# Patient Record
Sex: Female | Born: 1965 | Hispanic: No | Marital: Single | Smoking: Current every day smoker
Health system: Southern US, Community
[De-identification: ages and names within clinical notes are randomized; demographics above are authoritative.]

## PROBLEM LIST (undated history)

## (undated) DIAGNOSIS — R079 Chest pain, unspecified: Secondary | ICD-10-CM

## (undated) DIAGNOSIS — I10 Essential (primary) hypertension: Secondary | ICD-10-CM

## (undated) DIAGNOSIS — M1990 Unspecified osteoarthritis, unspecified site: Secondary | ICD-10-CM

## (undated) DIAGNOSIS — E119 Type 2 diabetes mellitus without complications: Secondary | ICD-10-CM

## (undated) DIAGNOSIS — I219 Acute myocardial infarction, unspecified: Secondary | ICD-10-CM

## (undated) DIAGNOSIS — K219 Gastro-esophageal reflux disease without esophagitis: Secondary | ICD-10-CM

## (undated) DIAGNOSIS — E669 Obesity, unspecified: Secondary | ICD-10-CM

## (undated) DIAGNOSIS — I499 Cardiac arrhythmia, unspecified: Secondary | ICD-10-CM

## (undated) DIAGNOSIS — Z9989 Dependence on other enabling machines and devices: Secondary | ICD-10-CM

## (undated) DIAGNOSIS — Z9289 Personal history of other medical treatment: Secondary | ICD-10-CM

## (undated) DIAGNOSIS — J189 Pneumonia, unspecified organism: Secondary | ICD-10-CM

## (undated) DIAGNOSIS — G4733 Obstructive sleep apnea (adult) (pediatric): Secondary | ICD-10-CM

## (undated) DIAGNOSIS — J45909 Unspecified asthma, uncomplicated: Secondary | ICD-10-CM

## (undated) DIAGNOSIS — R000 Tachycardia, unspecified: Secondary | ICD-10-CM

## (undated) HISTORY — DX: Chest pain, unspecified: R07.9

## (undated) HISTORY — DX: Essential (primary) hypertension: I10

## (undated) HISTORY — DX: Tachycardia, unspecified: R00.0

---

## 1997-02-28 ENCOUNTER — Ambulatory Visit (HOSPITAL_COMMUNITY): Admission: RE | Admit: 1997-02-28 | Discharge: 1997-02-28 | Payer: Self-pay | Admitting: Obstetrics

## 1997-03-08 ENCOUNTER — Inpatient Hospital Stay (HOSPITAL_COMMUNITY): Admission: AD | Admit: 1997-03-08 | Discharge: 1997-03-09 | Payer: Self-pay | Admitting: Obstetrics & Gynecology

## 1997-03-09 ENCOUNTER — Inpatient Hospital Stay (HOSPITAL_COMMUNITY): Admission: RE | Admit: 1997-03-09 | Discharge: 1997-03-10 | Payer: Self-pay | Admitting: Obstetrics

## 1997-03-10 ENCOUNTER — Inpatient Hospital Stay (HOSPITAL_COMMUNITY): Admission: AD | Admit: 1997-03-10 | Discharge: 1997-03-14 | Payer: Self-pay | Admitting: Obstetrics

## 1997-05-02 ENCOUNTER — Encounter: Admission: RE | Admit: 1997-05-02 | Discharge: 1997-05-02 | Payer: Self-pay | Admitting: Family Medicine

## 1997-05-23 ENCOUNTER — Encounter: Admission: RE | Admit: 1997-05-23 | Discharge: 1997-05-23 | Payer: Self-pay | Admitting: Family Medicine

## 1997-06-06 ENCOUNTER — Encounter: Admission: RE | Admit: 1997-06-06 | Discharge: 1997-06-06 | Payer: Self-pay | Admitting: Family Medicine

## 1997-06-19 ENCOUNTER — Encounter: Admission: RE | Admit: 1997-06-19 | Discharge: 1997-06-19 | Payer: Self-pay | Admitting: Family Medicine

## 1997-06-29 ENCOUNTER — Encounter: Admission: RE | Admit: 1997-06-29 | Discharge: 1997-06-29 | Payer: Self-pay | Admitting: Family Medicine

## 1997-08-06 ENCOUNTER — Encounter: Admission: RE | Admit: 1997-08-06 | Discharge: 1997-08-06 | Payer: Self-pay | Admitting: Family Medicine

## 1997-08-13 ENCOUNTER — Encounter: Admission: RE | Admit: 1997-08-13 | Discharge: 1997-08-13 | Payer: Self-pay | Admitting: Family Medicine

## 1997-09-05 ENCOUNTER — Encounter: Admission: RE | Admit: 1997-09-05 | Discharge: 1997-09-05 | Payer: Self-pay | Admitting: Family Medicine

## 1998-01-29 ENCOUNTER — Other Ambulatory Visit: Admission: RE | Admit: 1998-01-29 | Discharge: 1998-01-29 | Payer: Self-pay

## 1998-01-29 ENCOUNTER — Encounter: Admission: RE | Admit: 1998-01-29 | Discharge: 1998-01-29 | Payer: Self-pay | Admitting: Family Medicine

## 1998-06-24 ENCOUNTER — Encounter: Admission: RE | Admit: 1998-06-24 | Discharge: 1998-06-24 | Payer: Self-pay | Admitting: Sports Medicine

## 1998-09-28 ENCOUNTER — Emergency Department (HOSPITAL_COMMUNITY): Admission: EM | Admit: 1998-09-28 | Discharge: 1998-09-28 | Payer: Self-pay | Admitting: Emergency Medicine

## 1998-11-01 ENCOUNTER — Encounter: Admission: RE | Admit: 1998-11-01 | Discharge: 1998-11-01 | Payer: Self-pay | Admitting: Family Medicine

## 1998-11-05 ENCOUNTER — Emergency Department (HOSPITAL_COMMUNITY): Admission: EM | Admit: 1998-11-05 | Discharge: 1998-11-05 | Payer: Self-pay | Admitting: Emergency Medicine

## 1998-12-13 ENCOUNTER — Encounter: Admission: RE | Admit: 1998-12-13 | Discharge: 1998-12-13 | Payer: Self-pay | Admitting: Family Medicine

## 1999-01-22 ENCOUNTER — Encounter: Admission: RE | Admit: 1999-01-22 | Discharge: 1999-01-22 | Payer: Self-pay | Admitting: Family Medicine

## 1999-06-23 ENCOUNTER — Encounter: Payer: Self-pay | Admitting: Emergency Medicine

## 1999-06-23 ENCOUNTER — Emergency Department (HOSPITAL_COMMUNITY): Admission: EM | Admit: 1999-06-23 | Discharge: 1999-06-23 | Payer: Self-pay | Admitting: Emergency Medicine

## 2001-02-05 ENCOUNTER — Encounter: Payer: Self-pay | Admitting: Emergency Medicine

## 2001-02-05 ENCOUNTER — Emergency Department (HOSPITAL_COMMUNITY): Admission: EM | Admit: 2001-02-05 | Discharge: 2001-02-05 | Payer: Self-pay | Admitting: Emergency Medicine

## 2002-01-09 ENCOUNTER — Emergency Department (HOSPITAL_COMMUNITY): Admission: EM | Admit: 2002-01-09 | Discharge: 2002-01-09 | Payer: Self-pay | Admitting: Emergency Medicine

## 2002-01-09 ENCOUNTER — Encounter: Payer: Self-pay | Admitting: Emergency Medicine

## 2002-02-28 ENCOUNTER — Emergency Department (HOSPITAL_COMMUNITY): Admission: EM | Admit: 2002-02-28 | Discharge: 2002-02-28 | Payer: Self-pay | Admitting: Emergency Medicine

## 2002-02-28 ENCOUNTER — Encounter: Payer: Self-pay | Admitting: Internal Medicine

## 2002-05-04 ENCOUNTER — Inpatient Hospital Stay (HOSPITAL_COMMUNITY): Admission: AD | Admit: 2002-05-04 | Discharge: 2002-05-04 | Payer: Self-pay | Admitting: Family Medicine

## 2002-06-07 ENCOUNTER — Emergency Department (HOSPITAL_COMMUNITY): Admission: EM | Admit: 2002-06-07 | Discharge: 2002-06-07 | Payer: Self-pay | Admitting: Emergency Medicine

## 2002-06-14 ENCOUNTER — Emergency Department (HOSPITAL_COMMUNITY): Admission: EM | Admit: 2002-06-14 | Discharge: 2002-06-14 | Payer: Self-pay | Admitting: Emergency Medicine

## 2003-09-27 ENCOUNTER — Emergency Department (HOSPITAL_COMMUNITY): Admission: EM | Admit: 2003-09-27 | Discharge: 2003-09-27 | Payer: Self-pay | Admitting: Emergency Medicine

## 2003-10-15 ENCOUNTER — Emergency Department (HOSPITAL_COMMUNITY): Admission: EM | Admit: 2003-10-15 | Discharge: 2003-10-15 | Payer: Self-pay | Admitting: Emergency Medicine

## 2004-03-22 ENCOUNTER — Emergency Department (HOSPITAL_COMMUNITY): Admission: EM | Admit: 2004-03-22 | Discharge: 2004-03-23 | Payer: Self-pay | Admitting: Emergency Medicine

## 2004-10-12 ENCOUNTER — Emergency Department (HOSPITAL_COMMUNITY): Admission: EM | Admit: 2004-10-12 | Discharge: 2004-10-12 | Payer: Self-pay | Admitting: Emergency Medicine

## 2005-08-12 ENCOUNTER — Ambulatory Visit (HOSPITAL_COMMUNITY): Admission: RE | Admit: 2005-08-12 | Discharge: 2005-08-12 | Payer: Self-pay | Admitting: Obstetrics

## 2006-01-26 DIAGNOSIS — J189 Pneumonia, unspecified organism: Secondary | ICD-10-CM

## 2006-01-26 HISTORY — DX: Pneumonia, unspecified organism: J18.9

## 2006-02-22 ENCOUNTER — Emergency Department (HOSPITAL_COMMUNITY): Admission: EM | Admit: 2006-02-22 | Discharge: 2006-02-22 | Payer: Self-pay | Admitting: Emergency Medicine

## 2006-02-28 ENCOUNTER — Emergency Department (HOSPITAL_COMMUNITY): Admission: EM | Admit: 2006-02-28 | Discharge: 2006-02-28 | Payer: Self-pay | Admitting: Emergency Medicine

## 2006-03-02 ENCOUNTER — Emergency Department (HOSPITAL_COMMUNITY): Admission: EM | Admit: 2006-03-02 | Discharge: 2006-03-02 | Payer: Self-pay | Admitting: Emergency Medicine

## 2006-05-07 ENCOUNTER — Emergency Department (HOSPITAL_COMMUNITY): Admission: EM | Admit: 2006-05-07 | Discharge: 2006-05-07 | Payer: Self-pay | Admitting: Emergency Medicine

## 2006-06-22 ENCOUNTER — Emergency Department (HOSPITAL_COMMUNITY): Admission: EM | Admit: 2006-06-22 | Discharge: 2006-06-22 | Payer: Self-pay | Admitting: Emergency Medicine

## 2007-09-25 ENCOUNTER — Emergency Department (HOSPITAL_COMMUNITY): Admission: EM | Admit: 2007-09-25 | Discharge: 2007-09-26 | Payer: Self-pay | Admitting: Emergency Medicine

## 2007-11-08 ENCOUNTER — Encounter: Payer: Self-pay | Admitting: Emergency Medicine

## 2007-11-08 ENCOUNTER — Observation Stay (HOSPITAL_COMMUNITY): Admission: AD | Admit: 2007-11-08 | Discharge: 2007-11-08 | Payer: Self-pay | Admitting: Obstetrics

## 2007-11-22 ENCOUNTER — Inpatient Hospital Stay (HOSPITAL_COMMUNITY): Admission: AD | Admit: 2007-11-22 | Discharge: 2007-11-22 | Payer: Self-pay | Admitting: Obstetrics

## 2008-04-15 ENCOUNTER — Emergency Department (HOSPITAL_COMMUNITY): Admission: EM | Admit: 2008-04-15 | Discharge: 2008-04-16 | Payer: Self-pay | Admitting: Emergency Medicine

## 2008-06-28 DIAGNOSIS — J45991 Cough variant asthma: Secondary | ICD-10-CM

## 2008-06-28 DIAGNOSIS — M545 Low back pain: Secondary | ICD-10-CM

## 2008-07-10 ENCOUNTER — Emergency Department (HOSPITAL_COMMUNITY): Admission: EM | Admit: 2008-07-10 | Discharge: 2008-07-10 | Payer: Self-pay | Admitting: Emergency Medicine

## 2008-07-27 ENCOUNTER — Emergency Department (HOSPITAL_COMMUNITY): Admission: EM | Admit: 2008-07-27 | Discharge: 2008-07-28 | Payer: Self-pay | Admitting: Emergency Medicine

## 2008-11-21 ENCOUNTER — Emergency Department (HOSPITAL_COMMUNITY): Admission: EM | Admit: 2008-11-21 | Discharge: 2008-11-21 | Payer: Self-pay | Admitting: Emergency Medicine

## 2008-12-16 ENCOUNTER — Emergency Department (HOSPITAL_COMMUNITY): Admission: EM | Admit: 2008-12-16 | Discharge: 2008-12-17 | Payer: Self-pay | Admitting: Emergency Medicine

## 2009-03-25 ENCOUNTER — Emergency Department (HOSPITAL_COMMUNITY): Admission: EM | Admit: 2009-03-25 | Discharge: 2009-03-25 | Payer: Self-pay | Admitting: Emergency Medicine

## 2009-04-09 ENCOUNTER — Emergency Department (HOSPITAL_COMMUNITY): Admission: EM | Admit: 2009-04-09 | Discharge: 2009-04-09 | Payer: Self-pay | Admitting: Emergency Medicine

## 2009-10-21 ENCOUNTER — Inpatient Hospital Stay (HOSPITAL_COMMUNITY): Admission: AD | Admit: 2009-10-21 | Discharge: 2009-10-21 | Payer: Self-pay | Admitting: Family Medicine

## 2009-10-21 ENCOUNTER — Ambulatory Visit: Payer: Self-pay | Admitting: Family

## 2009-10-26 DIAGNOSIS — Z9289 Personal history of other medical treatment: Secondary | ICD-10-CM

## 2009-10-26 HISTORY — DX: Personal history of other medical treatment: Z92.89

## 2009-11-13 ENCOUNTER — Observation Stay (HOSPITAL_COMMUNITY): Admission: AD | Admit: 2009-11-13 | Discharge: 2009-11-14 | Payer: Self-pay | Admitting: Obstetrics and Gynecology

## 2009-11-13 ENCOUNTER — Ambulatory Visit: Payer: Self-pay | Admitting: Nurse Practitioner

## 2010-02-16 ENCOUNTER — Encounter: Payer: Self-pay | Admitting: Obstetrics

## 2010-02-17 ENCOUNTER — Encounter: Payer: Self-pay | Admitting: Obstetrics

## 2010-04-09 LAB — URINALYSIS, ROUTINE W REFLEX MICROSCOPIC
Nitrite: NEGATIVE
pH: 6 (ref 5.0–8.0)

## 2010-04-09 LAB — CBC
HCT: 22.6 % — ABNORMAL LOW (ref 36.0–46.0)
HCT: 29.5 % — ABNORMAL LOW (ref 36.0–46.0)
MCH: 28.3 pg (ref 26.0–34.0)
MCHC: 32.9 g/dL (ref 30.0–36.0)
MCHC: 33.6 g/dL (ref 30.0–36.0)
MCV: 85.8 fL (ref 78.0–100.0)
RBC: 2.64 MIL/uL — ABNORMAL LOW (ref 3.87–5.11)
RDW: 16.2 % — ABNORMAL HIGH (ref 11.5–15.5)
WBC: 7.5 10*3/uL (ref 4.0–10.5)
WBC: 9 10*3/uL (ref 4.0–10.5)

## 2010-04-09 LAB — URINE MICROSCOPIC-ADD ON

## 2010-04-09 LAB — CROSSMATCH
Antibody Screen: NEGATIVE
Unit division: 0

## 2010-04-09 LAB — ABO/RH: ABO/RH(D): A POS

## 2010-04-09 LAB — SAMPLE TO BLOOD BANK

## 2010-04-09 LAB — POCT PREGNANCY, URINE: Preg Test, Ur: NEGATIVE

## 2010-04-09 LAB — FERRITIN: Ferritin: 11 ng/mL (ref 10–291)

## 2010-04-10 LAB — GC/CHLAMYDIA PROBE AMP, GENITAL: Chlamydia, DNA Probe: POSITIVE — AB

## 2010-04-10 LAB — URINALYSIS, ROUTINE W REFLEX MICROSCOPIC
Leukocytes, UA: NEGATIVE
Nitrite: NEGATIVE
Protein, ur: NEGATIVE mg/dL
Specific Gravity, Urine: 1.02 (ref 1.005–1.030)
Urobilinogen, UA: 1 mg/dL (ref 0.0–1.0)

## 2010-04-10 LAB — WET PREP, GENITAL
Clue Cells Wet Prep HPF POC: NONE SEEN
Trich, Wet Prep: NONE SEEN
Yeast Wet Prep HPF POC: NONE SEEN

## 2010-04-10 LAB — URINE MICROSCOPIC-ADD ON

## 2010-04-10 LAB — CBC
HCT: 36 % (ref 36.0–46.0)
MCHC: 32.5 g/dL (ref 30.0–36.0)
Platelets: 245 10*3/uL (ref 150–400)
RDW: 15.8 % — ABNORMAL HIGH (ref 11.5–15.5)
WBC: 7.1 10*3/uL (ref 4.0–10.5)

## 2010-04-16 LAB — DIFFERENTIAL
Basophils Absolute: 0 10*3/uL (ref 0.0–0.1)
Lymphocytes Relative: 30 % (ref 12–46)
Lymphs Abs: 1.6 10*3/uL (ref 0.7–4.0)
Monocytes Absolute: 0.2 10*3/uL (ref 0.1–1.0)
Neutro Abs: 3.2 10*3/uL (ref 1.7–7.7)

## 2010-04-16 LAB — BASIC METABOLIC PANEL
Calcium: 8.7 mg/dL (ref 8.4–10.5)
GFR calc non Af Amer: >60 mL/min (ref >60)
Glucose, Bld: 178 mg/dL — ABNORMAL HIGH (ref 70–99)
Potassium: 3.6 mEq/L (ref 3.5–5.1)
Sodium: 140 mEq/L (ref 135–145)

## 2010-04-16 LAB — CBC
Hemoglobin: 11.4 g/dL — ABNORMAL LOW (ref 12.0–15.0)
Platelets: 264 10*3/uL (ref 150–400)
RDW: 14.7 % (ref 11.5–15.5)
WBC: 5.2 10*3/uL (ref 4.0–10.5)

## 2010-04-16 LAB — D-DIMER, QUANTITATIVE: D-Dimer, Quant: 0.39 ug/mL-FEU (ref 0.00–0.48)

## 2010-04-30 LAB — POCT I-STAT, CHEM 8
BUN: 5 mg/dL — ABNORMAL LOW (ref 6–23)
Chloride: 103 mEq/L (ref 96–112)
Potassium: 3.4 mEq/L — ABNORMAL LOW (ref 3.5–5.1)
Sodium: 138 mEq/L (ref 135–145)

## 2010-04-30 LAB — URINE CULTURE: Colony Count: 80000

## 2010-04-30 LAB — DIFFERENTIAL
Lymphs Abs: 2.2 10*3/uL (ref 0.7–4.0)
Monocytes Relative: 6 % (ref 3–12)
Neutro Abs: 3.3 10*3/uL (ref 1.7–7.7)
Neutrophils Relative %: 55 % (ref 43–77)

## 2010-04-30 LAB — URINALYSIS, ROUTINE W REFLEX MICROSCOPIC
Glucose, UA: NEGATIVE mg/dL
Ketones, ur: NEGATIVE mg/dL
Nitrite: NEGATIVE
Protein, ur: NEGATIVE mg/dL

## 2010-04-30 LAB — POCT CARDIAC MARKERS
CKMB, poc: 1.5 ng/mL (ref 1.0–8.0)
Myoglobin, poc: 61.1 ng/mL (ref 12–200)
Troponin i, poc: <0.05 ng/mL (ref 0.00–0.09)

## 2010-04-30 LAB — URINE MICROSCOPIC-ADD ON

## 2010-04-30 LAB — CBC
Hemoglobin: 10.8 g/dL — ABNORMAL LOW (ref 12.0–15.0)
RBC: 3.93 MIL/uL (ref 3.87–5.11)
WBC: 6.1 10*3/uL (ref 4.0–10.5)

## 2010-04-30 LAB — PROTIME-INR: INR: 0.99 (ref 0.00–1.49)

## 2010-10-27 LAB — BASIC METABOLIC PANEL
BUN: 7
CO2: 24
Calcium: 9.4
Chloride: 105
Creatinine, Ser: 0.7
Glucose, Bld: 91

## 2010-10-27 LAB — CBC
HCT: 33.9 — ABNORMAL LOW
Hemoglobin: 11 — ABNORMAL LOW
MCHC: 32.1
MCV: 85.3
Platelets: 262
RDW: 15.8 — ABNORMAL HIGH
WBC: 6.7

## 2010-10-27 LAB — DIFFERENTIAL
Basophils Absolute: 0
Basophils Relative: 0
Eosinophils Absolute: 0.1
Monocytes Relative: 6
Neutro Abs: 3.6
Neutrophils Relative %: 57

## 2010-10-27 LAB — URINALYSIS, ROUTINE W REFLEX MICROSCOPIC
Glucose, UA: NEGATIVE
Hgb urine dipstick: NEGATIVE
Ketones, ur: NEGATIVE
Nitrite: NEGATIVE
Protein, ur: NEGATIVE
Protein, ur: NEGATIVE
Urobilinogen, UA: 1

## 2010-10-27 LAB — WET PREP, GENITAL: Yeast Wet Prep HPF POC: NONE SEEN

## 2010-10-27 LAB — URINE MICROSCOPIC-ADD ON

## 2010-10-27 LAB — PREGNANCY, URINE: Preg Test, Ur: NEGATIVE

## 2011-09-09 ENCOUNTER — Observation Stay (HOSPITAL_COMMUNITY)
Admission: EM | Admit: 2011-09-09 | Discharge: 2011-09-11 | Disposition: A | Discharge status: Home / Self Care | Payer: Medicaid Other | Attending: Internal Medicine | Admitting: Internal Medicine

## 2011-09-09 ENCOUNTER — Emergency Department (HOSPITAL_COMMUNITY): Payer: Medicaid Other

## 2011-09-09 ENCOUNTER — Encounter (HOSPITAL_COMMUNITY): Payer: Self-pay

## 2011-09-09 DIAGNOSIS — R0989 Other specified symptoms and signs involving the circulatory and respiratory systems: Secondary | ICD-10-CM | POA: Insufficient documentation

## 2011-09-09 DIAGNOSIS — R0609 Other forms of dyspnea: Secondary | ICD-10-CM | POA: Insufficient documentation

## 2011-09-09 DIAGNOSIS — D649 Anemia, unspecified: Secondary | ICD-10-CM

## 2011-09-09 DIAGNOSIS — R002 Palpitations: Principal | ICD-10-CM

## 2011-09-09 DIAGNOSIS — M549 Dorsalgia, unspecified: Secondary | ICD-10-CM | POA: Insufficient documentation

## 2011-09-09 DIAGNOSIS — R209 Unspecified disturbances of skin sensation: Secondary | ICD-10-CM | POA: Insufficient documentation

## 2011-09-09 DIAGNOSIS — M1710 Unilateral primary osteoarthritis, unspecified knee: Secondary | ICD-10-CM | POA: Insufficient documentation

## 2011-09-09 DIAGNOSIS — R609 Edema, unspecified: Secondary | ICD-10-CM | POA: Insufficient documentation

## 2011-09-09 DIAGNOSIS — E1142 Type 2 diabetes mellitus with diabetic polyneuropathy: Secondary | ICD-10-CM | POA: Insufficient documentation

## 2011-09-09 DIAGNOSIS — M179 Osteoarthritis of knee, unspecified: Secondary | ICD-10-CM

## 2011-09-09 DIAGNOSIS — R079 Chest pain, unspecified: Secondary | ICD-10-CM

## 2011-09-09 DIAGNOSIS — E119 Type 2 diabetes mellitus without complications: Secondary | ICD-10-CM

## 2011-09-09 DIAGNOSIS — R600 Localized edema: Secondary | ICD-10-CM

## 2011-09-09 DIAGNOSIS — E1340 Other specified diabetes mellitus with diabetic neuropathy, unspecified: Secondary | ICD-10-CM

## 2011-09-09 DIAGNOSIS — G8929 Other chronic pain: Secondary | ICD-10-CM | POA: Insufficient documentation

## 2011-09-09 DIAGNOSIS — J45909 Unspecified asthma, uncomplicated: Secondary | ICD-10-CM | POA: Insufficient documentation

## 2011-09-09 DIAGNOSIS — E1149 Type 2 diabetes mellitus with other diabetic neurological complication: Secondary | ICD-10-CM | POA: Insufficient documentation

## 2011-09-09 HISTORY — DX: Unspecified asthma, uncomplicated: J45.909

## 2011-09-09 LAB — CBC WITH DIFFERENTIAL/PLATELET
Basophils Relative: 0 % (ref 0–1)
Eosinophils Absolute: 0.2 10*3/uL (ref 0.0–0.7)
HCT: 34 % — ABNORMAL LOW (ref 36.0–46.0)
Hemoglobin: 10.6 g/dL — ABNORMAL LOW (ref 12.0–15.0)
MCH: 25.5 pg — ABNORMAL LOW (ref 26.0–34.0)
MCHC: 31.2 g/dL (ref 30.0–36.0)
MCV: 81.9 fL (ref 78.0–100.0)
Monocytes Absolute: 0.4 10*3/uL (ref 0.1–1.0)
Monocytes Relative: 6 % (ref 3–12)

## 2011-09-09 LAB — COMPREHENSIVE METABOLIC PANEL
Albumin: 3.4 g/dL — ABNORMAL LOW (ref 3.5–5.2)
BUN: 10 mg/dL (ref 6–23)
Chloride: 105 mEq/L (ref 96–112)
Creatinine, Ser: 0.98 mg/dL (ref 0.50–1.10)
Total Bilirubin: 0.3 mg/dL (ref 0.3–1.2)
Total Protein: 7.2 g/dL (ref 6.0–8.3)

## 2011-09-09 LAB — TROPONIN I: Troponin I: <0.3 ng/mL (ref <0.30)

## 2011-09-09 MED ORDER — ASPIRIN 81 MG PO CHEW
324.0000 mg | CHEWABLE_TABLET | Freq: Once | ORAL | Status: AC
  Administered 2011-09-09: 324 mg via ORAL
  Filled 2011-09-09: qty 4

## 2011-09-09 NOTE — ED Provider Notes (Signed)
History     CSN: [id number]  Arrival date & time 09/09/11  1410   First MD Initiated Contact with Patient 09/09/11 2032      Chief Complaint  Patient presents with  . Palpitations    (Consider location/radiation/quality/duration/timing/severity/associated sxs/prior treatment) HPI Pt reports she has had several episodes of palpitations and dyspnea with exertion. No chest pains. She has had swelling in her knees attributed to arthritis which she states is chronic. No fever or cough. She also has history of asthma, has been using inhaler with some improvement.   Past Medical History  Diagnosis Date  . Asthma     History reviewed. No pertinent past surgical history.  History reviewed. No pertinent family history.  History  Substance Use Topics  . Smoking status: Current Everyday Smoker  . Smokeless tobacco: Not on file  . Alcohol Use: Yes    OB History    Grav Para Term Preterm Abortions TAB SAB Ect Mult Living                  Review of Systems All other systems reviewed and are negative except as noted in HPI.   Allergies  Other  Home Medications   Current Outpatient Rx  Name Route Sig Dispense Refill  . ALBUTEROL SULFATE HFA 108 (90 BASE) MCG/ACT IN AERS Inhalation Inhale 2 puffs into the lungs every 6 (six) hours as needed. For asthma    . NAPROXEN SODIUM 220 MG PO TABS Oral Take 220 mg by mouth 2 (two) times daily with a meal.      BP 124/108  Pulse 80  Temp 98.2 F (36.8 C) (Oral)  Resp 16  SpO2 97%  LMP 08/27/2011  Physical Exam  Nursing note and vitals reviewed. Constitutional: She is oriented to person, place, and time. She appears well-developed and well-nourished.  HENT:  Head: Normocephalic and atraumatic.  Eyes: EOM are normal. Pupils are equal, round, and reactive to light.  Neck: Normal range of motion. Neck supple.  Cardiovascular: Normal rate, normal heart sounds and intact distal pulses.   Pulmonary/Chest: Effort normal and breath  sounds normal.  Abdominal: Bowel sounds are normal. She exhibits no distension. There is no tenderness.  Musculoskeletal: Normal range of motion. She exhibits no edema and no tenderness.  Neurological: She is alert and oriented to person, place, and time. She has normal strength. No cranial nerve deficit or sensory deficit.  Skin: Skin is warm and dry. No rash noted.  Psychiatric: She has a normal mood and affect.    ED Course  Procedures (including critical care time)  Labs Reviewed  CBC WITH DIFFERENTIAL - Abnormal; Notable for the following:    Hemoglobin 10.6 (*)     HCT 34.0 (*)     MCH 25.5 (*)     All other components within normal limits  COMPREHENSIVE METABOLIC PANEL - Abnormal; Notable for the following:    Glucose, Bld 200 (*)     Albumin 3.4 (*)     GFR calc non Af Amer 68 (*)     GFR calc Af Amer 79 (*)     All other components within normal limits  TROPONIN I   Dg Chest 1 View  09/09/2011  *RADIOLOGY REPORT*  Clinical Data: Palpitations, shortness of breath.  CHEST - 1 VIEW  Comparison: 04/09/2009  Findings: Heart and mediastinal contours are within normal limits. No focal opacities or effusions.  No acute bony abnormality.  IMPRESSION: No active cardiopulmonary disease.  Original  Report Authenticated By: Cyndie Chime, M.D.     No diagnosis found.    MDM  PERC Neg. No signs of LE edema, she has chronic knee pain.    Date: 09/09/2011  Rate: 83  Rhythm: normal sinus rhythm  QRS Axis: normal  Intervals: normal  ST/T Wave abnormalities: nonspecific ST/T changes  Conduction Disutrbances:none  Narrative Interpretation:   Old EKG Reviewed: changes noted, new T wave inversions compared to 02/11   EKG with new T wave inversions. Will admit for further eval. Hospitalist to see in the ED.        Charles B. Bernette Mayers, MD 09/10/11 2956

## 2011-09-09 NOTE — ED Notes (Signed)
Patient C/O pain in both of her knees when she walks.  She also C/O palpitations when she walks.  Both knees are somewhat swollen. States that pain is sharp and severe.

## 2011-09-09 NOTE — H&P (Signed)
[facility]                                                                                    Patient Demographics  Rebecca Malone, is a 46 y.o. female  CSN: [id number]  MRN: [id number]  DOB - [birth year]  Admit Date - 09/09/2011  Outpatient Primary MD for the patient is Daisy Floro, MD   With History of -  Past Medical History  Diagnosis Date  . Asthma       History reviewed. No pertinent past surgical history.  in for   Chief Complaint  Patient presents with  . Palpitations     HPI  Rebecca Malone  is a 46 y.o. female, with past medical history significant for asthma obesity gestational diabetes and chronic back pain presenting today with 2 days history of chest pain, retrosternal, nonradiating, associated with shortness of breath , worse with exercise. Patient denies any cold sweats or nausea. The patient has noted increased swelling in her lower extremities and she is very worried because she has a family history of heart disease. No history of orthopnea or PND . The chest pain started 2 days ago as mentioned above and this pressure-like gets better with rest. The patient also is complaining about the knees swelling and painful. Today the patient reports an episode of tingling on the left side including face arm and leg   Review of Systems    In addition to the HPI above,No Fever-chills, No Headache, No changes with Vision or hearing, No problems swallowing food or Liquids, +Chest pain, Cough +Shortness of Breath, No Abdominal pain, No Nausea or Vommitting, Bowel movements are regular, No Blood in stool or Urine, No dysuria, No new skin rashes or bruises, No new joints pains-aches,  No new weakness+tingling on left side, numbness in any extremity, No recent weight gain or loss, No polyuria, polydypsia or polyphagia, No significant Mental Stressors.  A full 10 point Review of Systems was done, except as stated above, all other Review of  Systems were negative.   Social History History  Substance Use Topics  . Smoking status: Current Everyday Smoker  . Smokeless tobacco: Not on file  . Alcohol Use: Yes     Family History Significant for heart disease in her father and history of Kawasaki's vasculitis in her son  Prior to Admission medications   Medication Sig Start Date End Date Taking? Authorizing Provider  albuterol (PROVENTIL HFA;VENTOLIN HFA) 108 (90 BASE) MCG/ACT inhaler Inhale 2 puffs into the lungs every 6 (six) hours as needed. For asthma   Yes Historical Provider, MD  naproxen sodium (ANAPROX) 220 MG tablet Take 220 mg by mouth 2 (two) times daily with a meal.   Yes Historical Provider, MD    Allergies  Allergen Reactions  . Other Itching    Patient states she is allergic to all generic medications. Can only have name brand medicines    Physical Exam  Vitals  Blood pressure 154/106, pulse 68, temperature 98.2 F (36.8 C), temperature source Oral, resp. rate 18, last menstrual period 08/27/2011, SpO2 97.00%.   1. General middle aged [place] American female extremely pleasant in no  acute distress at the moment  2. Normal affect and insight, Not Suicidal or Homicidal, Awake Alert, Oriented X 3.  3. No F.N deficits, ALL C.Nerves Intact, Strength 5/5 all 4 extremities, Sensation intact all 4 extremities, Plantars down going.  4. Ears and Eyes appear Normal, Conjunctivae clear, PERRLA. Moist Oral Mucosa.  5. Supple Neck, No JVD, No cervical lymphadenopathy appriciated, No Carotid Bruits.  6. Symmetrical Chest wall movement, Good air movement bilaterally, CTAB.  7. RRR, No Gallops, Rubs or Murmurs, No Parasternal Heave.  8. Positive Bowel Sounds, Abdomen Soft, Non tender, No organomegaly appriciated,No rebound -guarding or rigidity.  9.  No Cyanosis, Normal Skin Turgor, No Skin Rash or Bruise.  10. Good muscle tone,  joints appear normal , no effusions, Normal ROM.  11. No Palpable Lymph Nodes  in Neck or Axillae    Data Review  CBC  Lab 09/09/11 1521  WBC 6.8  HGB 10.6*  HCT 34.0*  PLT 300  MCV 81.9  MCH 25.5*  MCHC 31.2  RDW 14.3  LYMPHSABS 2.2  MONOABS 0.4  EOSABS 0.2  BASOSABS 0.0  BANDABS --   ------------------------------------------------------------------------------------------------------------------  Chemistries   Lab 09/09/11 1521  NA 141  K 3.5  CL 105  CO2 24  GLUCOSE 200*  BUN 10  CREATININE 0.98  CALCIUM 9.0  MG --  AST 14  ALT 11  ALKPHOS 93  BILITOT 0.3   ---  Cardiac Enzymes  Lab 09/09/11 2112  CKMB --  TROPONINI <0.30  MYOGLOBIN --   ------------------------------------------------------------------------------------------------------------------   ---------------------------------------------------------------------------------------------------------------   ----------------------------------------------------------------------------------------------------------------    Imaging results:   Dg Chest 1 View  09/09/2011  *RADIOLOGY REPORT*  Clinical Data: Palpitations, shortness of breath.  CHEST - 1 VIEW  Comparison: 04/09/2009  Findings: Heart and mediastinal contours are within normal limits. No focal opacities or effusions.  No acute bony abnormality.  IMPRESSION: No active cardiopulmonary disease.  Original Report Authenticated By: Cyndie Chime, M.D.    My personal review of EKG: Normal sinus rhythm with T-wave inversions in the inferolateral leads  Assessment & Plan  1. chest pain worse with exertion EKG shows T-wave inversions in inferolateral leads. Patient is chest pain-free at the moment. Start aspirin nitro paste, heart rate seems to be fair so will not start a beta blocker. I also scheduled him for a stress test in the morning.  2. Low extremity edema ; patient was started on Lasix. Lower extremity Dopplers are ordered  3. Anemia; iron studies are ordered  4. Hyperglycemia we'll check her blood  sugars during his stay with low coverage  5 obesity  6. Chronic back pain  7. History of asthma will continue her home medicine and was started on Proventil when necessary.  DVT Prophylaxis Lovenox   AM Labs Ordered, also please review Full Orders  Family Communication: Admission, patients condition and plan of care including tests being ordered have been discussed with the patient who indicate understanding and agree with the plan and Code Status.  Code Status full Disposition Plan: Home with office followup Time spent in minutes : 35 minutes  Condition fair

## 2011-09-09 NOTE — ED Notes (Signed)
Pt updated on care and wait times.  

## 2011-09-09 NOTE — ED Notes (Signed)
Pt reports chest palpitations, SOB, (L) leg swelling, (L) side numbness x2 weeks. Nad, a/o x4

## 2011-09-10 ENCOUNTER — Observation Stay (HOSPITAL_COMMUNITY): Payer: Medicaid Other

## 2011-09-10 ENCOUNTER — Encounter (HOSPITAL_COMMUNITY): Payer: Self-pay | Admitting: *Deleted

## 2011-09-10 DIAGNOSIS — R609 Edema, unspecified: Secondary | ICD-10-CM

## 2011-09-10 DIAGNOSIS — D649 Anemia, unspecified: Secondary | ICD-10-CM

## 2011-09-10 DIAGNOSIS — M7989 Other specified soft tissue disorders: Secondary | ICD-10-CM

## 2011-09-10 DIAGNOSIS — R002 Palpitations: Principal | ICD-10-CM

## 2011-09-10 LAB — COMPREHENSIVE METABOLIC PANEL
ALT: 9 U/L (ref 0–35)
AST: 11 U/L (ref 0–37)
Alkaline Phosphatase: 86 U/L (ref 39–117)
CO2: 26 mEq/L (ref 19–32)
Chloride: 102 mEq/L (ref 96–112)
GFR calc Af Amer: >90 mL/min (ref >90)
GFR calc non Af Amer: 79 mL/min — ABNORMAL LOW (ref >90)
Glucose, Bld: 218 mg/dL — ABNORMAL HIGH (ref 70–99)
Potassium: 3.5 mEq/L (ref 3.5–5.1)
Sodium: 139 mEq/L (ref 135–145)
Total Bilirubin: 0.2 mg/dL — ABNORMAL LOW (ref 0.3–1.2)

## 2011-09-10 LAB — CARDIAC PANEL(CRET KIN+CKTOT+MB+TROPI)
CK, MB: 2.5 ng/mL (ref 0.3–4.0)
CK, MB: 2.4 ng/mL (ref 0.3–4.0)
Relative Index: 1.8 (ref 0.0–2.5)
Troponin I: <0.3 ng/mL (ref <0.30)
Troponin I: <0.3 ng/mL (ref <0.30)

## 2011-09-10 LAB — CBC
HCT: 33.5 % — ABNORMAL LOW (ref 36.0–46.0)
Hemoglobin: 10.5 g/dL — ABNORMAL LOW (ref 12.0–15.0)
Hemoglobin: 10 g/dL — ABNORMAL LOW (ref 12.0–15.0)
MCH: 25.9 pg — ABNORMAL LOW (ref 26.0–34.0)
MCHC: 31.3 g/dL (ref 30.0–36.0)
RBC: 4.08 MIL/uL (ref 3.87–5.11)
RBC: 3.86 MIL/uL — ABNORMAL LOW (ref 3.87–5.11)
WBC: 6.2 10*3/uL (ref 4.0–10.5)

## 2011-09-10 LAB — GLUCOSE, CAPILLARY
Glucose-Capillary: 215 mg/dL — ABNORMAL HIGH (ref 70–99)
Glucose-Capillary: 204 mg/dL — ABNORMAL HIGH (ref 70–99)

## 2011-09-10 LAB — CREATININE, SERUM: GFR calc non Af Amer: >90 mL/min (ref >90)

## 2011-09-10 LAB — IRON AND TIBC
Saturation Ratios: 8 % — ABNORMAL LOW (ref 20–55)
UIBC: 381 ug/dL (ref 125–400)

## 2011-09-10 MED ORDER — ZOLPIDEM TARTRATE 5 MG PO TABS: 5.0000 mg | ORAL_TABLET | Freq: Every evening | ORAL | Status: DC | PRN

## 2011-09-10 MED ORDER — NITROGLYCERIN 2 % TD OINT
0.5000 [in_us] | TOPICAL_OINTMENT | Freq: Four times a day (QID) | TRANSDERMAL | Status: DC
  Administered 2011-09-10 (×2): 0.5 [in_us] via TOPICAL
  Filled 2011-09-10: qty 30

## 2011-09-10 MED ORDER — ALBUTEROL SULFATE (5 MG/ML) 0.5% IN NEBU
2.5000 mg | INHALATION_SOLUTION | Freq: Four times a day (QID) | RESPIRATORY_TRACT | Status: DC
  Administered 2011-09-10 – 2011-09-11 (×5): 2.5 mg via RESPIRATORY_TRACT
  Filled 2011-09-10 (×5): qty 0.5

## 2011-09-10 MED ORDER — ONDANSETRON HCL 4 MG/2ML IJ SOLN: 4.0000 mg | Freq: Four times a day (QID) | INTRAMUSCULAR | Status: DC | PRN

## 2011-09-10 MED ORDER — MORPHINE SULFATE 2 MG/ML IJ SOLN: 1.0000 mg | INTRAMUSCULAR | Status: DC | PRN

## 2011-09-10 MED ORDER — INSULIN ASPART 100 UNIT/ML ~~LOC~~ SOLN
0.0000 [IU] | Freq: Three times a day (TID) | SUBCUTANEOUS | Status: DC
  Administered 2011-09-10: 3 [IU] via SUBCUTANEOUS
  Administered 2011-09-10 – 2011-09-11 (×2): 1 [IU] via SUBCUTANEOUS

## 2011-09-10 MED ORDER — FUROSEMIDE 8 MG/ML PO SOLN
40.0000 mg | Freq: Every day | ORAL | Status: DC
  Administered 2011-09-10 – 2011-09-11 (×2): 40 mg via ORAL
  Filled 2011-09-10: qty 4
  Filled 2011-09-10: qty 5

## 2011-09-10 MED ORDER — ONDANSETRON HCL 4 MG PO TABS: 4.0000 mg | ORAL_TABLET | Freq: Four times a day (QID) | ORAL | Status: DC | PRN

## 2011-09-10 MED ORDER — ACETAMINOPHEN 325 MG PO TABS
650.0000 mg | ORAL_TABLET | Freq: Four times a day (QID) | ORAL | Status: DC | PRN
  Administered 2011-09-10: 650 mg via ORAL
  Filled 2011-09-10: qty 2

## 2011-09-10 MED ORDER — ASPIRIN EC 81 MG PO TBEC
81.0000 mg | DELAYED_RELEASE_TABLET | Freq: Every day | ORAL | Status: DC
  Administered 2011-09-10 – 2011-09-11 (×2): 81 mg via ORAL
  Filled 2011-09-10 (×2): qty 1

## 2011-09-10 MED ORDER — SODIUM CHLORIDE 0.9 % IJ SOLN
3.0000 mL | Freq: Two times a day (BID) | INTRAMUSCULAR | Status: DC
  Administered 2011-09-10 (×3): 3 mL via INTRAVENOUS

## 2011-09-10 MED ORDER — HYDROCODONE-ACETAMINOPHEN 5-325 MG PO TABS
1.0000 | ORAL_TABLET | ORAL | Status: DC | PRN
  Administered 2011-09-10 – 2011-09-11 (×4): 2 via ORAL
  Filled 2011-09-10 (×4): qty 2

## 2011-09-10 MED ORDER — ALBUTEROL SULFATE HFA 108 (90 BASE) MCG/ACT IN AERS: 2.0000 | INHALATION_SPRAY | Freq: Four times a day (QID) | RESPIRATORY_TRACT | Status: DC | PRN

## 2011-09-10 MED ORDER — ENOXAPARIN SODIUM 40 MG/0.4ML ~~LOC~~ SOLN
40.0000 mg | Freq: Every day | SUBCUTANEOUS | Status: DC
  Administered 2011-09-10 – 2011-09-11 (×2): 40 mg via SUBCUTANEOUS
  Filled 2011-09-10 (×2): qty 0.4

## 2011-09-10 MED ORDER — ACETAMINOPHEN 650 MG RE SUPP: 650.0000 mg | Freq: Four times a day (QID) | RECTAL | Status: DC | PRN

## 2011-09-10 MED ORDER — LIVING WELL WITH DIABETES BOOK
Freq: Once | Status: AC
  Administered 2011-09-10: 17:00:00
  Filled 2011-09-10: qty 1

## 2011-09-10 MED ORDER — DOCUSATE SODIUM 100 MG PO CAPS
100.0000 mg | ORAL_CAPSULE | Freq: Two times a day (BID) | ORAL | Status: DC
  Administered 2011-09-10 – 2011-09-11 (×3): 100 mg via ORAL
  Filled 2011-09-10 (×4): qty 1

## 2011-09-10 NOTE — Progress Notes (Signed)
VASCULAR LAB PRELIMINARY  PRELIMINARY  PRELIMINARY  PRELIMINARY  Bilateral lower extremity venous duplex completed.    Preliminary report:  Bilateral:  No evidence of DVT, superficial thrombosis, or Baker's Cyst.   Gabriela Irigoyen, RVS 09/10/2011, 11:49 AM

## 2011-09-10 NOTE — Care Management Note (Unsigned)
    Page 1 of 1   09/10/2011     11:17:41 AM   CARE MANAGEMENT NOTE 09/10/2011  Patient:  Rebecca Malone, Rebecca Malone   Account Number:  [id number]  Date Initiated:  09/10/2011  Documentation initiated by:  SIMMONS,Verniece Encarnacion  Subjective/Objective Assessment:   ADMITTED WITH CP; LIVES AT HOME WITH FAMILY; WAS IPTA.     Action/Plan:   DISCHARGE PLANNING INITIATED- PT OFF UNIT PRESENTLY.   Anticipated DC Date:  09/11/2011   Anticipated DC Plan:  HOME/SELF CARE      DC Planning Services  CM consult      Choice offered to / List presented to:             Status of service:  In process, will continue to follow Medicare Important Message given?   (If response is "NO", the following Medicare IM given date fields will be blank) Date Medicare IM given:   Date Additional Medicare IM given:    Discharge Disposition:    Per UR Regulation:  Reviewed for med. necessity/level of care/duration of stay  If discussed at Long Length of Stay Meetings, dates discussed:    Comments:  09/10/11  1011  Avonda Toso SIMMONS RN, BSN [phone] NCM WILL FOLLOW.

## 2011-09-10 NOTE — Progress Notes (Signed)
Subjective: Denies chest pain, reports intermittent numbness of left arm and R arm off and on for weeks Also c/o pain and swelling of both knees  Objective: Vital signs in last 24 hours: Temp:  [97.4 F (36.3 C)-98.8 F (37.1 C)] 98.8 F (37.1 C) (08/15 0504) Pulse Rate:  [68-89] 75  (08/15 0504) Resp:  [16-20] 16  (08/15 0504) BP: (124-155)/(60-108) 128/77 mmHg (08/15 0504) SpO2:  [95 %-100 %] 95 % (08/15 0756) Weight:  [122.063 kg (269 lb 1.6 oz)] 122.063 kg (269 lb 1.6 oz) (08/15 0145) Weight change:  Last BM Date: 09/09/11  Intake/Output from previous day:      Physical Exam: General: Alert, awake, oriented x3, in no acute distress. HEENT: No bruits, no goiter. Heart: Regular rate and rhythm, without murmurs, rubs, gallops. Lungs: Clear to auscultation bilaterally. Abdomen: Soft, nontender, nondistended, positive bowel sounds. Extremities: No clubbing cyanosis or edema with positive pedal pulses. Neuro: Grossly intact, nonfocal.    Lab Results: Basic Metabolic Panel:  Basename 09/10/11 0600 09/10/11 0140 09/09/11 1521  NA 139 -- 141  K 3.5 -- 3.5  CL 102 -- 105  CO2 26 -- 24  GLUCOSE 218* -- 200*  BUN 12 -- 10  CREATININE 0.87 0.78 --  CALCIUM 8.9 -- 9.0  MG -- -- --  PHOS -- -- --   Liver Function Tests:  Basename 09/10/11 0600 09/09/11 1521  AST 11 14  ALT 9 11  ALKPHOS 86 93  BILITOT 0.2* 0.3  PROT 6.6 7.2  ALBUMIN 3.2* 3.4*   No results found for this basename: LIPASE:2,AMYLASE:2 in the last 72 hours No results found for this basename: AMMONIA:2 in the last 72 hours CBC:  Basename 09/10/11 0600 09/10/11 0140 09/09/11 1521  WBC 6.2 7.0 --  NEUTROABS -- -- 4.0  HGB 10.0* 10.5* --  HCT 32.1* 33.5* --  MCV 83.2 82.1 --  PLT 292 294 --   Cardiac Enzymes:  Basename 09/10/11 0138 09/09/11 2112  CKTOTAL 169 --  CKMB 2.4 --  CKMBINDEX -- --  TROPONINI <0.30 <0.30   BNP: No results found for this basename: PROBNP:3 in the last 72  hours D-Dimer: No results found for this basename: DDIMER:2 in the last 72 hours CBG:  Basename 09/10/11 0609 09/10/11 0107  GLUCAP 215* 226*   Hemoglobin A1C: No results found for this basename: HGBA1C in the last 72 hours Fasting Lipid Panel: No results found for this basename: CHOL,HDL,LDLCALC,TRIG,CHOLHDL,LDLDIRECT in the last 72 hours Thyroid Function Tests: No results found for this basename: TSH,T4TOTAL,FREET4,T3FREE,THYROIDAB in the last 72 hours Anemia Panel: No results found for this basename: VITAMINB12,FOLATE,FERRITIN,TIBC,IRON,RETICCTPCT in the last 72 hours Coagulation: No results found for this basename: LABPROT:2,INR:2 in the last 72 hours Urine Drug Screen: Drugs of Abuse  No results found for this basename: labopia, cocainscrnur, labbenz, amphetmu, thcu, labbarb    Alcohol Level: No results found for this basename: ETH:2 in the last 72 hours Urinalysis: No results found for this basename: COLORURINE:2,APPERANCEUR:2,LABSPEC:2,PHURINE:2,GLUCOSEU:2,HGBUR:2,BILIRUBINUR:2,KETONESUR:2,PROTEINUR:2,UROBILINOGEN:2,NITRITE:2,LEUKOCYTESUR:2 in the last 72 hours  No results found for this or any previous visit (from the past 240 hour(s)).  Studies/Results: Dg Chest 1 View  09/09/2011  *RADIOLOGY REPORT*  Clinical Data: Palpitations, shortness of breath.  CHEST - 1 VIEW  Comparison: 04/09/2009  Findings: Heart and mediastinal contours are within normal limits. No focal opacities or effusions.  No acute bony abnormality.  IMPRESSION: No active cardiopulmonary disease.  Original Report Authenticated By: Cyndie Chime, M.D.    Medications: Scheduled Meds:   .  albuterol  2.5 mg Nebulization Q6H  . aspirin  324 mg Oral Once  . aspirin EC  81 mg Oral Daily  . docusate sodium  100 mg Oral BID  . enoxaparin (LOVENOX) injection  40 mg Subcutaneous Daily  . furosemide  40 mg Oral Daily  . insulin aspart  0-9 Units Subcutaneous TID WC  . nitroGLYCERIN  0.5 inch Topical Q6H  .  sodium chloride  3 mL Intravenous Q12H   Continuous Infusions:  PRN Meds:.acetaminophen, acetaminophen, albuterol, HYDROcodone-acetaminophen, morphine injection, ondansetron (ZOFRAN) IV, ondansetron, zolpidem  Assessment/Plan: 1. Chest palpitations Denies any chest pain EKG w/ TWI in inferolateral leads Monitor on telemetry Check ECHO TSH, free t4 1 more set of cardiac enzymes Outpatient stress test depending on echo results  2. Intermittent Left and R arm numbness for weeks Check CT Cervical spine B12 level  3. Knee swelling /pain Doubt DVT, but dopplers ordered per admitter and pending Check Xrays, PT eval  4. DM/Hyperglycemia: check HbAIC, metformin at DC, SSI  5. DVT prophylaxis: lovenox   LOS: 1 day   [facility] Pager: [phone] 09/10/2011, 9:08 AM

## 2011-09-10 NOTE — Progress Notes (Signed)
  Echocardiogram 2D Echocardiogram has been performed.  Emelia Loron 09/10/2011, 2:13 PM

## 2011-09-10 NOTE — Progress Notes (Signed)
Pt's cbg=221, pt has been npo for a stress test and due to have insulin for coverage, but was not given, MD on call notified, Paulino Rily she said it was ok to hold insulin for this am.---Stephanie Littman, Sherrilynn Gudgel, rn

## 2011-09-10 NOTE — Progress Notes (Addendum)
Inpatient Diabetes Program Recommendations  AACE/ADA: New Consensus Statement on Inpatient Glycemic Control  Target Ranges:  Prepandial:   less than 140 mg/dL      Peak postprandial:   less than 180 mg/dL (1-2 hours)      Critically ill patients:  140 - 180 mg/dL  Pager:  [phone] Hours:  8 am-10pm   Reason for Visit: Elevated glucose:  Results for ARLYNE, BRANDES (MRN [id number]) as of 09/10/2011 10:52  Ref. Range 09/10/2011 01:07 09/10/2011 06:09  Glucose-Capillary Latest Range: 70-99 mg/dL 130 (H) 865 (H)   Inpatient Diabetes Program Recommendations  HgbA1C: Check HgbA1C to assess glycemic control  Please order outpatient Diabetes Education.  Alfredia Client PhD, RN, BC-ADM Diabetes Coordinator  Office:  [phone] Team Pager:  [phone]

## 2011-09-11 ENCOUNTER — Other Ambulatory Visit (HOSPITAL_COMMUNITY): Payer: Self-pay | Admitting: Interventional Cardiology

## 2011-09-11 DIAGNOSIS — M1710 Unilateral primary osteoarthritis, unspecified knee: Secondary | ICD-10-CM | POA: Diagnosis present

## 2011-09-11 DIAGNOSIS — E119 Type 2 diabetes mellitus without complications: Secondary | ICD-10-CM | POA: Diagnosis present

## 2011-09-11 DIAGNOSIS — R079 Chest pain, unspecified: Secondary | ICD-10-CM

## 2011-09-11 DIAGNOSIS — E1340 Other specified diabetes mellitus with diabetic neuropathy, unspecified: Secondary | ICD-10-CM | POA: Diagnosis present

## 2011-09-11 LAB — GLUCOSE, CAPILLARY: Glucose-Capillary: 146 mg/dL — ABNORMAL HIGH (ref 70–99)

## 2011-09-11 LAB — BASIC METABOLIC PANEL
BUN: 12 mg/dL (ref 6–23)
CO2: 28 mEq/L (ref 19–32)
Chloride: 103 mEq/L (ref 96–112)
Glucose, Bld: 161 mg/dL — ABNORMAL HIGH (ref 70–99)
Potassium: 3.4 mEq/L — ABNORMAL LOW (ref 3.5–5.1)

## 2011-09-11 MED ORDER — UNABLE TO FIND: Status: DC

## 2011-09-11 MED ORDER — ASPIRIN 81 MG PO TBEC: 81.0000 mg | DELAYED_RELEASE_TABLET | Freq: Every day | ORAL | Status: DC

## 2011-09-11 MED ORDER — NAPROXEN SODIUM 220 MG PO TABS: 440.0000 mg | ORAL_TABLET | Freq: Two times a day (BID) | ORAL | Status: DC

## 2011-09-11 MED ORDER — HYDROCODONE-ACETAMINOPHEN 5-325 MG PO TABS: 1.0000 | ORAL_TABLET | ORAL | Status: AC | PRN

## 2011-09-11 MED ORDER — METFORMIN HCL 500 MG PO TABS: 500.0000 mg | ORAL_TABLET | Freq: Two times a day (BID) | ORAL | Status: DC

## 2011-09-11 NOTE — Evaluation (Signed)
Physical Therapy Evaluation Patient Details Name: Rebecca Malone MRN: [id number] DOB: [birth year] Today's Date: 09/11/2011 Time: 4098-1191 PT Time Calculation (min): 14 min  PT Assessment / Plan / Recommendation Clinical Impression  Pt adm with chest palpitations and bil knee pain.  Recommend rolling walker and OPPT for knee pain.    PT Assessment  All further PT needs can be met in the next venue of care    Follow Up Recommendations  Outpatient PT    Barriers to Discharge        Equipment Recommendations  Rolling walker with 5" wheels    Recommendations for Other Services     Frequency      Precautions / Restrictions Restrictions Weight Bearing Restrictions: No   Pertinent Vitals/Pain Knee pain 4/10      Mobility  Bed Mobility Bed Mobility: Sit to Supine Sit to Supine: 7: Independent Transfers Transfers: Sit to Stand;Stand to Sit Sit to Stand: From bed;With upper extremity assist;6: Modified independent (Device/Increase time) Stand to Sit: To chair/3-in-1;With upper extremity assist;6: Modified independent (Device/Increase time) Details for Transfer Assistance: Incr time Ambulation/Gait Ambulation/Gait Assistance: 6: Modified independent (Device/Increase time) Ambulation Distance (Feet): 150 Feet Assistive device: Rolling walker Ambulation/Gait Assistance Details: Pt limping on lt. Gait Pattern: Decreased stance time - left;Antalgic    Exercises     PT Diagnosis: Difficulty walking  PT Problem List: Decreased range of motion;Pain;Decreased activity tolerance PT Treatment Interventions:     PT Goals    Visit Information  Last PT Received On: 09/11/11 Assistance Needed: +1    Subjective Data  Subjective: Pt states her knees have been swollen and painful x 3 weeks. Patient Stated Goal: For knees to get better.   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   Balance    End of Session PT - End of Session Activity Tolerance: Patient tolerated treatment well Patient left: in chair;with call bell/phone within reach Nurse Communication: Mobility status  GP Functional Assessment Tool Used: clinical judgement Functional Limitation: Mobility: Walking and moving around Mobility: Walking and Moving Around Current Status ([id number]): At least 1 percent but less than 20 percent impaired, limited or restricted Mobility: Walking and Moving Around Goal Status [phone]): At least 1 percent but less than 20 percent impaired, limited or restricted Mobility: Walking and Moving Around Discharge Status [phone]): At least 1 percent but less than 20 percent impaired, limited or restricted   [facility] 09/11/2011, 9:39 AM  [facility] PT [phone]

## 2011-09-23 ENCOUNTER — Other Ambulatory Visit (HOSPITAL_COMMUNITY): Payer: Medicaid Other

## 2011-09-23 NOTE — Discharge Summary (Signed)
Physician Discharge Summary  Patient ID: KAMRYN MESSINEO MRN: [id number] DOB/AGE: [birth year] 46 y.o.  Admit date: 09/09/2011 Discharge date: 09/23/2011  Primary Care Physician:  Daisy Floro, MD   Discharge Diagnoses:     Chest palpitations  intermittent L and R arm numbness  Anemia  Diabetes mellitus  Degenerative arthritis of knee  Neuropathy due to secondary diabetes  Asthma   Medication List  As of 09/23/2011  3:22 PM   TAKE these medications         albuterol 108 (90 BASE) MCG/ACT inhaler   Commonly known as: PROVENTIL HFA;VENTOLIN HFA   Inhale 2 puffs into the lungs every 6 (six) hours as needed. For asthma      aspirin 81 MG EC tablet   Take 1 tablet (81 mg total) by mouth daily.      metFORMIN 500 MG tablet   Commonly known as: GLUCOPHAGE   Take 1 tablet (500 mg total) by mouth 2 (two) times daily with a meal.      naproxen sodium 220 MG tablet   Commonly known as: ANAPROX   Take 2 tablets (440 mg total) by mouth 2 (two) times daily with a meal. For 3 days      UNABLE TO FIND   1. Glucometer  2. Blood glucose test strips # 50, check CBG BID  3. Needles # 50      UNABLE TO FIND   Outpatient Physical therapy             Disposition and Follow-up:  PCP in 1 week  Significant Diagnostic Studies:  MRI CERVICAL SPINE WITHOUT CONTRAST IMPRESSION: Normal MRI of the cervical spine.  RIGHT KNEE - 1-2 VIEW IMPRESSION: Degenerative change without acute abnormality.  CHEST - 1 VIEW IMPRESSION: No active cardiopulmonary disease.  ECHO 8/15 Study Conclusions - Left ventricle: The cavity size was normal. There was moderate concentric hypertrophy. Systolic function was normal. Wall motion was normal; there were no regional wall motion abnormalities. - Left atrium: The atrium was mildly dilated.   Brief H and P: Rebecca Malone is a 46 y.o. female, with past medical history significant for asthma obesity gestational diabetes and chronic back  pain presenting today with 2 days history of chest pain, retrosternal, nonradiating, associated with shortness of breath , worse with exercise. Patient denies any cold sweats or nausea. The patient has noted increased swelling in her lower extremities and she is very worried because she has a family history of heart disease. No history of orthopnea or PND .  The chest pain started 2 days ago as mentioned above and this pressure-like gets better with rest. The patient also is complaining about the knees swelling and painful. Today the patient reports an episode of tingling on the left side including face arm and leg  Hospital Course:  1. Chest palpitations  Denies any chest pain  EKG w/ TWI in inferolateral leads  Monitor on telemetry  Check ECHO  TSH, free t4  1 more set of cardiac enzymes  Outpatient stress test set up    2. Intermittent Left and R arm numbness for weeks to months MRI Cervical spine normal B12 level level Suspect diabetic peripheral neuropathy Consider neurontin if persists and outpatient PT  3. Knee swelling /pain  dopplers negative   Xrays negative, ambulated with PT eval  Improved with naproxen, continue for 2-3 days only  4. DM/Hyperglycemia:  metformin at DC, HbAIC pending   Time spent on Discharge:  Signed: Zannie Cove  [facility] Pager: [phone] 09/23/2011, 3:22 PM

## 2011-09-24 ENCOUNTER — Other Ambulatory Visit (HOSPITAL_COMMUNITY): Payer: Medicaid Other

## 2012-03-03 ENCOUNTER — Emergency Department (HOSPITAL_COMMUNITY)
Admission: EM | Admit: 2012-03-03 | Discharge: 2012-03-03 | Disposition: A | Discharge status: Home / Self Care | Payer: Medicaid Other | Attending: Emergency Medicine | Admitting: Emergency Medicine

## 2012-03-03 ENCOUNTER — Encounter (HOSPITAL_COMMUNITY): Payer: Self-pay | Admitting: Cardiology

## 2012-03-03 ENCOUNTER — Emergency Department (HOSPITAL_COMMUNITY): Payer: Medicaid Other

## 2012-03-03 DIAGNOSIS — R200 Anesthesia of skin: Secondary | ICD-10-CM

## 2012-03-03 DIAGNOSIS — F17200 Nicotine dependence, unspecified, uncomplicated: Secondary | ICD-10-CM | POA: Insufficient documentation

## 2012-03-03 DIAGNOSIS — M6281 Muscle weakness (generalized): Secondary | ICD-10-CM | POA: Insufficient documentation

## 2012-03-03 DIAGNOSIS — Z79899 Other long term (current) drug therapy: Secondary | ICD-10-CM | POA: Insufficient documentation

## 2012-03-03 DIAGNOSIS — R0789 Other chest pain: Secondary | ICD-10-CM | POA: Insufficient documentation

## 2012-03-03 DIAGNOSIS — J45901 Unspecified asthma with (acute) exacerbation: Secondary | ICD-10-CM | POA: Insufficient documentation

## 2012-03-03 DIAGNOSIS — I10 Essential (primary) hypertension: Secondary | ICD-10-CM | POA: Insufficient documentation

## 2012-03-03 DIAGNOSIS — Z7982 Long term (current) use of aspirin: Secondary | ICD-10-CM | POA: Insufficient documentation

## 2012-03-03 DIAGNOSIS — R209 Unspecified disturbances of skin sensation: Secondary | ICD-10-CM | POA: Insufficient documentation

## 2012-03-03 LAB — CBC
HCT: 40 % (ref 36.0–46.0)
MCH: 27 pg (ref 26.0–34.0)
MCHC: 33.3 g/dL (ref 30.0–36.0)
RDW: 16.2 % — ABNORMAL HIGH (ref 11.5–15.5)

## 2012-03-03 LAB — BASIC METABOLIC PANEL
BUN: 11 mg/dL (ref 6–23)
Calcium: 9.6 mg/dL (ref 8.4–10.5)
Creatinine, Ser: 0.91 mg/dL (ref 0.50–1.10)
GFR calc Af Amer: 86 mL/min — ABNORMAL LOW (ref >90)
GFR calc non Af Amer: 74 mL/min — ABNORMAL LOW (ref >90)
Glucose, Bld: 105 mg/dL — ABNORMAL HIGH (ref 70–99)
Potassium: 3.6 mEq/L (ref 3.5–5.1)

## 2012-03-03 MED ORDER — IPRATROPIUM BROMIDE 0.02 % IN SOLN
0.5000 mg | Freq: Once | RESPIRATORY_TRACT | Status: AC
  Administered 2012-03-03: 0.5 mg via RESPIRATORY_TRACT
  Filled 2012-03-03: qty 2.5

## 2012-03-03 MED ORDER — LORAZEPAM 2 MG/ML IJ SOLN
2.0000 mg | Freq: Once | INTRAMUSCULAR | Status: AC
Start: 2012-03-03 — End: 2012-03-03
  Administered 2012-03-03: 2 mg via INTRAVENOUS
  Filled 2012-03-03: qty 1

## 2012-03-03 MED ORDER — DIAZEPAM 5 MG/ML IJ SOLN
5.0000 mg | Freq: Once | INTRAMUSCULAR | Status: AC
  Administered 2012-03-03: 5 mg via INTRAVENOUS
  Filled 2012-03-03: qty 2

## 2012-03-03 MED ORDER — ALBUTEROL SULFATE HFA 108 (90 BASE) MCG/ACT IN AERS
1.0000 | INHALATION_SPRAY | RESPIRATORY_TRACT | Status: DC | PRN
  Administered 2012-03-03: 1 via RESPIRATORY_TRACT
  Filled 2012-03-03: qty 6.7

## 2012-03-03 MED ORDER — ALBUTEROL SULFATE (5 MG/ML) 0.5% IN NEBU
5.0000 mg | INHALATION_SOLUTION | Freq: Once | RESPIRATORY_TRACT | Status: AC
  Administered 2012-03-03: 5 mg via RESPIRATORY_TRACT
  Filled 2012-03-03: qty 1

## 2012-03-03 NOTE — ED Notes (Signed)
Pt reports stabbing chest pain on the left side that started on Monday. Reports she has been having intermittent episodes of tingling/numbness on the left side for the past 2 days also. Reports some SOB. No distress noted in triage.

## 2012-03-03 NOTE — ED Provider Notes (Signed)
History     CSN: [id number]  Arrival date & time 03/03/12  1737   First MD Initiated Contact with Patient 03/03/12 2039      Chief Complaint  Patient presents with  . Chest Pain    (Consider location/radiation/quality/duration/timing/severity/associated sxs/prior treatment) HPI Comments: Rebecca Malone is a 47 y.o. female current everyday smoker with a history of asthma presents emergency department with multiple complaints.  Patient reports that on Monday she began having sharp intermittent stabbing chest pain that lasted seconds to minutes and went away on own , then later developed a similar pain at 630 in the morning that was also sharp.  Patient's primary concern however is that she also developed a left-sided numbness that included both upper and lower extremities lasting approximately 5-10 minutes.  She described a sensation as an inability to move the affected extremities as a felt heavy with a driving weight. Since the incident resolved the patient has had mild residual left sided weakness but denies any other neuro deficits including slurred speech, facial asymmetry, decreased sensation, inability to ambulate, nausea, diaphoresis, exertional chest pain, fever, night sweats, chills.  Lastly, patient reports that she is having some shortness of breath that is similar to asthma exacerbation, however she is out of her typical medications and has not been able to follow up with her primary care provider for refills.  Recent Admit for similar presentation 8/14-8/28/2013 wk up as below: - Normal MRI of Cervical spine, suspect diabetic nephropathy for arm numbness - Chest pain, atypical: sharp palpitations       Echo w normal systolic function, mod concentric LV hypertrophy       Normal CXR     The history is provided by the patient and medical records.    Past Medical History  Diagnosis Date  . Asthma     History reviewed. No pertinent past surgical history.  History reviewed.  No pertinent family history.  History  Substance Use Topics  . Smoking status: Current Every Day Smoker -- 1.0 packs/day for 15 years    Types: Cigarettes  . Smokeless tobacco: Not on file  . Alcohol Use: Yes    OB History    Grav Para Term Preterm Abortions TAB SAB Ect Mult Living                  Review of Systems  All other systems reviewed and are negative.    Allergies  Other  Home Medications   Current Outpatient Rx  Name  Route  Sig  Dispense  Refill  . ALBUTEROL SULFATE HFA 108 (90 BASE) MCG/ACT IN AERS   Inhalation   Inhale 2 puffs into the lungs every 6 (six) hours as needed. For asthma         . ASPIRIN 81 MG PO TBEC   Oral   Take 1 tablet (81 mg total) by mouth daily.   30 tablet      . ADULT MULTIVITAMIN W/MINERALS CH   Oral   Take 1 tablet by mouth every other day. Velocity         . NAPROXEN SODIUM 220 MG PO TABS   Oral   Take 440 mg by mouth daily as needed. For pain         . UNABLE TO FIND      1. Glucometer 2. Blood glucose test strips # 50, check CBG BID 3. Needles # 50   1 each   0   . UNABLE TO  FIND      Outpatient Physical therapy   1 each        BP 166/106  Pulse 77  Temp 98.3 F (36.8 C) (Oral)  Resp 18  SpO2 97%  Physical Exam  Nursing note and vitals reviewed. Constitutional: She is oriented to person, place, and time. She appears well-developed and well-nourished. No distress.       Hypertensive  HENT:  Head: Normocephalic and atraumatic.  Eyes: Conjunctivae normal and EOM are normal.  Neck: Normal range of motion.  Cardiovascular:       Regular rate rhythm, no aberrancy and auscultation.  Intact distal pulses.  Pulmonary/Chest: Effort normal.       Mild expiratory wheezing through out. Normal effort and no acute resp distress  Abdominal:       Obese abdomen soft, nontender with normal bowel sounds.  Musculoskeletal:       No bony tenderness to palpation, normal range of motion of all extremities.   Neurological: She is alert and oriented to person, place, and time.       Decreased sensation reported at left trigeminal nerve.  Other cranial nerves intact.  Intact distal sensation.  Good coordination.  Decreased strength in left upper and lower extremity.  Inability to hold left leg up greater than one second.  Skin: Skin is warm and dry. No rash noted. She is not diaphoretic.  Psychiatric: She has a normal mood and affect. Her behavior is normal.    ED Course  Procedures (including critical care time)  Labs Reviewed  CBC - Abnormal; Notable for the following:    RDW 16.2 (*)     All other components within normal limits  BASIC METABOLIC PANEL - Abnormal; Notable for the following:    Glucose, Bld 105 (*)     GFR calc non Af Amer 74 (*)     GFR calc Af Amer 86 (*)     All other components within normal limits  POCT I-STAT TROPONIN I   Dg Chest 2 View  03/03/2012  *RADIOLOGY REPORT*  Clinical Data: Chest pain  CHEST - 2 VIEW  Comparison: 09/09/2011; 04/09/2009  Findings: Grossly unchanged borderline enlarged cardiac silhouette. Normal mediastinal contours.  Grossly unchanged bilateral medial basilar opacities favored to represent atelectasis.  No focal airspace opacities.  The lungs are hyperexpanded with flattening of bilateral hemidiaphragms mild diffuse thickening of the pulmonary interstitium.  No pleural effusion or pneumothorax.  Unchanged bones.  IMPRESSION: Findings suggestive of airways disease.  No focal airspace opacities to suggest pneumonia.   Original Report Authenticated By: Tacey Ruiz, MD    Mr Brain Wo Contrast  03/03/2012  *RADIOLOGY REPORT*  Clinical Data: Left arm tingling.  Dizziness.  Shortness of breath.  MRI HEAD WITHOUT CONTRAST  Technique:  Multiplanar, multiecho pulse sequences of the brain and surrounding structures were obtained according to standard protocol without intravenous contrast.  Comparison: None.  Findings: The diffusion weighted images demonstrate  no evidence for acute or subacute infarction.  Midline structures are within normal limits.  No hemorrhage or mass lesion is present.  There is no significant white matter disease.  The ventricles are of normal size.  No significant extra-axial fluid collection is present. Flow is present in the major intracranial arteries.  The globes and orbits are intact.  The paranasal sinuses and mastoid air cells are clear.  IMPRESSION: Negative MRI of the brain.   Original Report Authenticated By: Marin Roberts, M.D.  No diagnosis found.    MDM  Left extremity weakness and atypical chest pain Hypertension Mild asthma exacerbation   Pt to ER c/o left extremity numbness or weakness for which she was evaluated during hospital admission a couple of months ago.  At that time MRI of cervical spine showed no acute abnormalities and etiology was explained to be possibly diabetic nephropathy.  Patient was to followup with primary care provider but has not been compliant with followup.  Recent symptoms of weakness returned again today.  As patient described this presentation is more of a weakness rather than numbness and tingling MRI brain was performed to assure no acute intercranial abnormality and occurred.  In addition patient was given nebulizer treatments for her mild asthma exacerbation.  Patient has been advised that she needs to get her high blood pressure rechecked by primary care provider.  Intermittent atypical sharp chest pain is low concern for cardiac etiology as patient has had a cardiology workup during hospital admission.  Today patient's chest x-ray is within normal limits negative troponin and no acute changes on ECG. Pt has been advised to follow up with PCP and strict return precautions discussed.     Jaci Carrel, [place] 03/03/12 2256

## 2012-03-04 NOTE — ED Provider Notes (Signed)
Medical screening examination/treatment/procedure(s) were performed by non-physician practitioner and as supervising physician I was immediately available for consultation/collaboration.    Khizar Fiorella R Johaan Ryser, MD 03/04/12 0003 

## 2012-03-26 DIAGNOSIS — R000 Tachycardia, unspecified: Secondary | ICD-10-CM

## 2012-03-26 HISTORY — DX: Tachycardia, unspecified: R00.0

## 2012-04-07 ENCOUNTER — Encounter (HOSPITAL_COMMUNITY): Payer: Self-pay | Admitting: *Deleted

## 2012-04-07 ENCOUNTER — Emergency Department (HOSPITAL_COMMUNITY): Payer: Medicaid Other

## 2012-04-07 ENCOUNTER — Observation Stay (HOSPITAL_COMMUNITY)
Admission: EM | Admit: 2012-04-07 | Discharge: 2012-04-09 | Disposition: A | Discharge status: Home / Self Care | Payer: Medicaid Other | Attending: Internal Medicine | Admitting: Internal Medicine

## 2012-04-07 DIAGNOSIS — E1340 Other specified diabetes mellitus with diabetic neuropathy, unspecified: Secondary | ICD-10-CM

## 2012-04-07 DIAGNOSIS — J45901 Unspecified asthma with (acute) exacerbation: Principal | ICD-10-CM | POA: Diagnosis present

## 2012-04-07 DIAGNOSIS — J45909 Unspecified asthma, uncomplicated: Secondary | ICD-10-CM

## 2012-04-07 DIAGNOSIS — E119 Type 2 diabetes mellitus without complications: Secondary | ICD-10-CM | POA: Diagnosis present

## 2012-04-07 DIAGNOSIS — I498 Other specified cardiac arrhythmias: Secondary | ICD-10-CM | POA: Insufficient documentation

## 2012-04-07 DIAGNOSIS — R0602 Shortness of breath: Secondary | ICD-10-CM | POA: Diagnosis present

## 2012-04-07 DIAGNOSIS — R079 Chest pain, unspecified: Secondary | ICD-10-CM | POA: Insufficient documentation

## 2012-04-07 DIAGNOSIS — R600 Localized edema: Secondary | ICD-10-CM

## 2012-04-07 DIAGNOSIS — M545 Low back pain: Secondary | ICD-10-CM

## 2012-04-07 DIAGNOSIS — R05 Cough: Secondary | ICD-10-CM | POA: Insufficient documentation

## 2012-04-07 DIAGNOSIS — J189 Pneumonia, unspecified organism: Secondary | ICD-10-CM | POA: Diagnosis present

## 2012-04-07 DIAGNOSIS — D649 Anemia, unspecified: Secondary | ICD-10-CM

## 2012-04-07 DIAGNOSIS — J45991 Cough variant asthma: Secondary | ICD-10-CM | POA: Diagnosis present

## 2012-04-07 DIAGNOSIS — R059 Cough, unspecified: Secondary | ICD-10-CM | POA: Insufficient documentation

## 2012-04-07 DIAGNOSIS — M1710 Unilateral primary osteoarthritis, unspecified knee: Secondary | ICD-10-CM

## 2012-04-07 LAB — CBC WITH DIFFERENTIAL/PLATELET
Basophils Absolute: 0 10*3/uL (ref 0.0–0.1)
Eosinophils Relative: 4 % (ref 0–5)
HCT: 38.9 % (ref 36.0–46.0)
Hemoglobin: 12.7 g/dL (ref 12.0–15.0)
Lymphocytes Relative: 34 % (ref 12–46)
Lymphs Abs: 2 10*3/uL (ref 0.7–4.0)
MCV: 81.9 fL (ref 78.0–100.0)
Monocytes Absolute: 0.3 10*3/uL (ref 0.1–1.0)
Monocytes Relative: 6 % (ref 3–12)
Neutro Abs: 3.3 10*3/uL (ref 1.7–7.7)
RBC: 4.75 MIL/uL (ref 3.87–5.11)
WBC: 5.9 10*3/uL (ref 4.0–10.5)

## 2012-04-07 LAB — POCT I-STAT TROPONIN I: Troponin i, poc: 0.04 ng/mL (ref 0.00–0.08)

## 2012-04-07 LAB — POCT I-STAT, CHEM 8
BUN: 5 mg/dL — ABNORMAL LOW (ref 6–23)
Calcium, Ion: 1.15 mmol/L (ref 1.12–1.23)
Chloride: 103 mEq/L (ref 96–112)
Creatinine, Ser: 1 mg/dL (ref 0.50–1.10)
TCO2: 28 mmol/L (ref 0–100)

## 2012-04-07 MED ORDER — METHYLPREDNISOLONE SODIUM SUCC 125 MG IJ SOLR
125.0000 mg | Freq: Once | INTRAMUSCULAR | Status: AC
  Administered 2012-04-07: 125 mg via INTRAVENOUS
  Filled 2012-04-07: qty 2

## 2012-04-07 MED ORDER — IPRATROPIUM BROMIDE 0.02 % IN SOLN
0.5000 mg | RESPIRATORY_TRACT | Status: DC
  Administered 2012-04-07 – 2012-04-08 (×2): 0.5 mg via RESPIRATORY_TRACT
  Filled 2012-04-07 (×2): qty 2.5

## 2012-04-07 MED ORDER — ALBUTEROL SULFATE (5 MG/ML) 0.5% IN NEBU
5.0000 mg | INHALATION_SOLUTION | Freq: Once | RESPIRATORY_TRACT | Status: AC
  Administered 2012-04-07: 5 mg via RESPIRATORY_TRACT
  Filled 2012-04-07: qty 1

## 2012-04-07 MED ORDER — IPRATROPIUM-ALBUTEROL 0.5-2.5 (3) MG/3ML IN SOLN: 3.0000 mL | Freq: Two times a day (BID) | RESPIRATORY_TRACT | Status: DC | PRN

## 2012-04-07 MED ORDER — AZITHROMYCIN 250 MG PO TABS: 250.0000 mg | ORAL_TABLET | Freq: Every day | ORAL | Status: DC

## 2012-04-07 MED ORDER — ALBUTEROL SULFATE (5 MG/ML) 0.5% IN NEBU
2.5000 mg | INHALATION_SOLUTION | RESPIRATORY_TRACT | Status: DC
  Administered 2012-04-07 – 2012-04-08 (×2): 2.5 mg via RESPIRATORY_TRACT
  Filled 2012-04-07 (×2): qty 0.5

## 2012-04-07 MED ORDER — PREDNISONE 20 MG PO TABS: 60.0000 mg | ORAL_TABLET | Freq: Every day | ORAL | Status: DC

## 2012-04-07 MED ORDER — ALBUTEROL SULFATE HFA 108 (90 BASE) MCG/ACT IN AERS: 2.0000 | INHALATION_SPRAY | Freq: Four times a day (QID) | RESPIRATORY_TRACT | Status: DC | PRN

## 2012-04-07 NOTE — ED Provider Notes (Signed)
History     CSN: [id number]  Arrival date & time 04/07/12  2156  Chief Complaint  Patient presents with  . Chest Pain  . Shortness of Breath  . Nasal Congestion  . Cough   Patient is a 47 y.o. female presenting with shortness of breath, cough, and wheezing.  Shortness of Breath Associated symptoms: cough and wheezing   Associated symptoms: no abdominal pain, no fever, no rash and no vomiting   Cough Associated symptoms: rhinorrhea, shortness of breath and wheezing   Associated symptoms: no chills, no fever and no rash   Wheezing Severity:  Moderate Severity compared to prior episodes:  Similar Onset quality:  Gradual Timing:  Constant Progression:  Worsening Chronicity:  New Relieved by:  Beta-agonist inhaler Ineffective treatments:  Beta-agonist inhaler Associated symptoms: chest tightness, cough, rhinorrhea and shortness of breath   Associated symptoms: no fever and no rash     47 y/o female with history of asthma who presents with cc of chest tightness, shortness of breath, cough, congestion, and wheezing. She has been using her home inhalers with minimal relief. She states her chest tightness and pain worsens with coughing. She states her symptoms have progressively worsened over the last several days.    Past Medical History  Diagnosis Date  . Asthma     History reviewed. No pertinent past surgical history.  No family history on file.  History  Substance Use Topics  . Smoking status: Current Every Day Smoker -- 1.00 packs/day for 15 years    Types: Cigarettes  . Smokeless tobacco: Not on file  . Alcohol Use: Yes    OB History   Grav Para Term Preterm Abortions TAB SAB Ect Mult Living                 Review of Systems  Constitutional: Negative for fever and chills.  HENT: Positive for congestion and rhinorrhea.   Respiratory: Positive for cough, chest tightness, shortness of breath and wheezing.   Cardiovascular: Positive for palpitations.   Gastrointestinal: Negative for nausea, vomiting and abdominal pain.  Genitourinary: Negative for dysuria and frequency.  Skin: Negative for rash.  All other systems reviewed and are negative.   Allergies  Other  Home Medications   Current Outpatient Rx  Name  Route  Sig  Dispense  Refill  . Multiple Vitamin (MULTIVITAMIN WITH MINERALS) TABS   Oral   Take 1 tablet by mouth daily. Velocity         . naproxen sodium (ANAPROX) 220 MG tablet   Oral   Take 440 mg by mouth daily as needed. For pain         . albuterol (PROVENTIL HFA;VENTOLIN HFA) 108 (90 BASE) MCG/ACT inhaler   Inhalation   Inhale 2 puffs into the lungs every 6 (six) hours as needed. For asthma   6.7 g   3   . azithromycin (ZITHROMAX) 250 MG tablet   Oral   Take 1 tablet (250 mg total) by mouth daily. Take first 2 tablets together, then 1 every day until finished.   6 tablet   0   . ipratropium-albuterol (DUONEB) 0.5-2.5 (3) MG/3ML SOLN   Nebulization   Take 3 mLs by nebulization 2 (two) times daily as needed. For wheezing   360 mL   3   . predniSONE (DELTASONE) 20 MG tablet   Oral   Take 3 tablets (60 mg total) by mouth daily.   15 tablet   0   . UNABLE  TO FIND      1. Glucometer 2. Blood glucose test strips # 50, check CBG BID 3. Needles # 50   1 each   0   . UNABLE TO FIND      Outpatient Physical therapy   1 each        BP 161/95  Pulse 85  Temp(Src) 99.5 F (37.5 C) (Oral)  Resp 28  SpO2 94%  Physical Exam  Nursing note and vitals reviewed. Constitutional: She is oriented to person, place, and time. She appears well-developed and well-nourished. No distress.  HENT:  Head: Normocephalic and atraumatic.  Mouth/Throat: No oropharyngeal exudate.  Eyes: Conjunctivae are normal. Pupils are equal, round, and reactive to light.  Neck: Normal range of motion. Neck supple.  Cardiovascular: Normal rate and normal heart sounds.  Exam reveals no gallop and no friction rub.   No  murmur heard. Pulmonary/Chest: No accessory muscle usage. Tachypnea (mild) noted. No respiratory distress. She has wheezes (diffuse).  Abdominal: Soft. She exhibits no distension. There is no tenderness.  Musculoskeletal: Normal range of motion. She exhibits no edema and no tenderness.  Neurological: She is alert and oriented to person, place, and time.  Skin: Skin is warm and dry.  Psychiatric: She has a normal mood and affect.    ED Course  Procedures (including critical care time)  Labs Reviewed  POCT I-STAT, CHEM 8 - Abnormal; Notable for the following:    BUN 5 (*)    Glucose, Bld 215 (*)    All other components within normal limits  CBC WITH DIFFERENTIAL  POCT I-STAT TROPONIN I   Dg Chest 2 View  04/07/2012  *RADIOLOGY REPORT*  Clinical Data: Chest pain, shortness of breath  CHEST - 2 VIEW  Comparison: 03/03/2012  Findings: Heart size mildly enlarged.  Central vascular congestion. Mild central peribronchial thickening.  Mild right infrahilar opacity.  No pleural effusion.  No pneumothorax.  Mild multilevel degenerative change.  IMPRESSION: Heart size upper normal to mildly enlarged with central vascular congestion.  Central peribronchial thickening may reflect acute or chronic bronchitis.  Mild right infrahilar opacity; atelectasis versus infiltrate.   Original Report Authenticated By: Jearld Lesch, M.D.     1. Asthma exacerbation   2. Community acquired pneumonia     MDM   47 y/o female with history of asthma who presents with cc of chest tightness, shortness of breath, cough, congestion, and wheezing. Likely mild-moderate asthma exacerbation. CXR with possible right hilar infiltrate. Will treat for community acquired pneumonia. Solumedrol and duonebs given for asthma exacerbation. Transfer of care to Dr. Norlene Campbell for recheck following neb treatment. If better, plan to discharge home with script for prednisone, albuterol, refill of duoneb script, and azithromycin.      Shanon Ace, MD 04/07/12 [phone]

## 2012-04-07 NOTE — ED Notes (Signed)
22:00 disreguard vital signs wrong patient.

## 2012-04-07 NOTE — ED Notes (Signed)
ED Resident at bedside for assessment

## 2012-04-07 NOTE — ED Notes (Addendum)
This past Sunday: cp, followed by coughing, "feels like someone sitting on chest." hx. Of asthma, mild exp. Wheezing. Rhonchi throughout. "feels hot."  Central cp with coughing. Nebulizer and albuterol puffer- not helping.

## 2012-04-08 ENCOUNTER — Encounter (HOSPITAL_COMMUNITY): Payer: Self-pay | Admitting: *Deleted

## 2012-04-08 DIAGNOSIS — J45901 Unspecified asthma with (acute) exacerbation: Secondary | ICD-10-CM

## 2012-04-08 DIAGNOSIS — J189 Pneumonia, unspecified organism: Secondary | ICD-10-CM

## 2012-04-08 DIAGNOSIS — E119 Type 2 diabetes mellitus without complications: Secondary | ICD-10-CM

## 2012-04-08 DIAGNOSIS — R0602 Shortness of breath: Secondary | ICD-10-CM

## 2012-04-08 LAB — GLUCOSE, CAPILLARY: Glucose-Capillary: 285 mg/dL — ABNORMAL HIGH (ref 70–99)

## 2012-04-08 LAB — TROPONIN I: Troponin I: <0.3 ng/mL (ref <0.30)

## 2012-04-08 LAB — HEMOGLOBIN A1C: Mean Plasma Glucose: 140 mg/dL — ABNORMAL HIGH (ref <117)

## 2012-04-08 MED ORDER — LEVOFLOXACIN 750 MG PO TABS
750.0000 mg | ORAL_TABLET | ORAL | Status: DC
  Administered 2012-04-08 – 2012-04-09 (×2): 750 mg via ORAL
  Filled 2012-04-08 (×3): qty 1

## 2012-04-08 MED ORDER — LEVALBUTEROL HCL 0.63 MG/3ML IN NEBU
0.6300 mg | INHALATION_SOLUTION | Freq: Four times a day (QID) | RESPIRATORY_TRACT | Status: DC | PRN
  Filled 2012-04-08: qty 3

## 2012-04-08 MED ORDER — INSULIN ASPART 100 UNIT/ML ~~LOC~~ SOLN
0.0000 [IU] | Freq: Three times a day (TID) | SUBCUTANEOUS | Status: DC
  Administered 2012-04-08: 3 [IU] via SUBCUTANEOUS
  Administered 2012-04-09 (×2): 2 [IU] via SUBCUTANEOUS

## 2012-04-08 MED ORDER — SODIUM CHLORIDE 0.9 % IJ SOLN
3.0000 mL | Freq: Two times a day (BID) | INTRAMUSCULAR | Status: DC
  Administered 2012-04-08 – 2012-04-09 (×3): 3 mL via INTRAVENOUS

## 2012-04-08 MED ORDER — ALPRAZOLAM 0.5 MG PO TABS
0.5000 mg | ORAL_TABLET | Freq: Once | ORAL | Status: AC
  Administered 2012-04-08: 0.5 mg via ORAL
  Filled 2012-04-08: qty 1

## 2012-04-08 MED ORDER — BUDESONIDE-FORMOTEROL FUMARATE 80-4.5 MCG/ACT IN AERO
2.0000 | INHALATION_SPRAY | Freq: Two times a day (BID) | RESPIRATORY_TRACT | Status: DC
  Administered 2012-04-08 – 2012-04-09 (×2): 2 via RESPIRATORY_TRACT
  Filled 2012-04-08: qty 6.9

## 2012-04-08 MED ORDER — INSULIN ASPART 100 UNIT/ML ~~LOC~~ SOLN
0.0000 [IU] | Freq: Every day | SUBCUTANEOUS | Status: DC
  Administered 2012-04-08: 2 [IU] via SUBCUTANEOUS

## 2012-04-08 MED ORDER — ALBUTEROL SULFATE (5 MG/ML) 0.5% IN NEBU: 2.5000 mg | INHALATION_SOLUTION | Freq: Four times a day (QID) | RESPIRATORY_TRACT | Status: DC | PRN

## 2012-04-08 MED ORDER — SODIUM CHLORIDE 0.9 % IV SOLN: 250.0000 mL | INTRAVENOUS | Status: DC | PRN

## 2012-04-08 MED ORDER — DEXTROSE 5 % IV SOLN
1.0000 g | Freq: Once | INTRAVENOUS | Status: AC
  Administered 2012-04-08: 1 g via INTRAVENOUS
  Filled 2012-04-08: qty 10

## 2012-04-08 MED ORDER — SODIUM CHLORIDE 0.9 % IJ SOLN: 3.0000 mL | INTRAMUSCULAR | Status: DC | PRN

## 2012-04-08 MED ORDER — PREDNISONE 20 MG PO TABS
20.0000 mg | ORAL_TABLET | Freq: Two times a day (BID) | ORAL | Status: DC
  Administered 2012-04-08 – 2012-04-09 (×3): 20 mg via ORAL
  Filled 2012-04-08 (×6): qty 1

## 2012-04-08 MED ORDER — ALBUTEROL (5 MG/ML) CONTINUOUS INHALATION SOLN
10.0000 mg/h | INHALATION_SOLUTION | Freq: Once | RESPIRATORY_TRACT | Status: AC
  Administered 2012-04-08: 10 mg/h via RESPIRATORY_TRACT
  Filled 2012-04-08: qty 20

## 2012-04-08 MED ORDER — DILTIAZEM HCL ER COATED BEADS 120 MG PO CP24
120.0000 mg | ORAL_CAPSULE | Freq: Every day | ORAL | Status: DC
  Administered 2012-04-08 – 2012-04-09 (×2): 120 mg via ORAL
  Filled 2012-04-08 (×2): qty 1

## 2012-04-08 MED ORDER — ASPIRIN EC 81 MG PO TBEC
81.0000 mg | DELAYED_RELEASE_TABLET | Freq: Every day | ORAL | Status: DC
  Administered 2012-04-08 – 2012-04-09 (×2): 81 mg via ORAL
  Filled 2012-04-08 (×2): qty 1

## 2012-04-08 NOTE — ED Provider Notes (Signed)
I saw and evaluated the patient, reviewed the resident's note and I agree with the findings and plan. Pt with hx asthma, c/o non prod cough, increased wheezing. Wheezing bil. Cxr. Albuterol.    Suzi Roots, MD 04/08/12 [phone]

## 2012-04-08 NOTE — Progress Notes (Signed)
[facility] PROGRESS NOTE  Rebecca Malone WUJ:[id number] DOB: 01-Apr-1965 DOA: 04/07/2012 PCP: Rebecca Floro, MD  Assessment/Plan: 1. Chest pain with EKG changes and palpitations - c/w aspirin - plan for cardiology consultation. Check troponin again. Dr Eldridge Dace from [facility] was called and he asked that we called the unassigned cardiology service.  2. Sinus tachycardia with HTN and angina - started cardizem sr 04/08/12, check TSH 3.   Asthma exacerbation with acute bronchitis - plan for abx, bronchodilators, nebs, steroids. Arrange outpt f/u with pulm - PCP must refer because patient is Medicaid.    Code Status: full Family Communication: mother at bedside  Disposition Plan: home    Consultants:  None for now   Antibiotics: levaquin   HPI/Subjective: C/o palpitations    Objective: Filed Vitals:   04/08/12 0330 04/08/12 0403 04/08/12 0415 04/08/12 0830  BP: 167/63  168/65 164/85  Pulse: 107  112 102  Temp:   98 F (36.7 C) 97.9 F (36.6 C)  TempSrc:   Oral Oral  Resp: 25  24 22   Height:   5\' 5"  (1.651 m)   Weight:   114.941 kg (253 lb 6.4 oz)   SpO2: 97% 97% 97% 100%   No intake or output data in the 24 hours ending 04/08/12 1227 Filed Weights   04/08/12 0415  Weight: 114.941 kg (253 lb 6.4 oz)    Patient Vitals for the past 24 hrs:  BP Temp Temp src Pulse Resp SpO2 Height Weight  04/08/12 0830 164/85 mmHg 97.9 F (36.6 C) Oral 102 22 100 % - -  04/08/12 0415 168/65 mmHg 98 F (36.7 C) Oral 112 24 97 % 5\' 5"  (1.651 m) 114.941 kg (253 lb 6.4 oz)  04/08/12 0403 - - - - - 97 % - -  04/08/12 0330 167/63 mmHg - - 107 25 97 % - -  04/08/12 0300 166/61 mmHg - - 106 20 97 % - -  04/08/12 0250 179/58 mmHg - - - - - - -  04/08/12 0250 - - - 108 25 98 % - -  04/08/12 0245 160/63 mmHg - - 108 - 99 % - -  04/08/12 0200 155/112 mmHg - - 93 - 100 % - -  04/08/12 0130 154/89 mmHg - - 81 - 100 % - -  04/08/12 0114 - - - - - 98 % - -  04/08/12 0100  155/94 mmHg - - 80 15 98 % - -  04/08/12 0030 97/65 mmHg - - 77 24 99 % - -  04/07/12 2345 149/65 mmHg - - 73 22 100 % - -  04/07/12 2330 160/69 mmHg - - 75 19 100 % - -  04/07/12 2215 161/95 mmHg 99.5 F (37.5 C) Oral 85 28 94 % - -  04/07/12 2200 119/75 mmHg 99.9 F (37.7 C) Oral 112 20 100 % - -    Exam:   General:  axox3  Cardiovascular: tachy, reg  Respiratory: ctab, no w,r,c   Abdomen: soft, nt, bs present   Musculoskeletal: no atrophy    Data Reviewed: Basic Metabolic Panel:  Recent Labs Lab 04/07/12 2231  NA 139  K 3.5  CL 103  GLUCOSE 215*  BUN 5*  CREATININE 1.00   Liver Function Tests: No results found for this basename: AST, ALT, ALKPHOS, BILITOT, PROT, ALBUMIN,  in the last 168 hours No results found for this basename: LIPASE, AMYLASE,  in the last 168 hours No results found for this basename: AMMONIA,  in the last 168 hours CBC:  Recent Labs Lab 04/07/12 2230 04/07/12 2231  WBC 5.9  --   NEUTROABS 3.3  --   HGB 12.7 13.9  HCT 38.9 41.0  MCV 81.9  --   PLT 244  --    Cardiac Enzymes: No results found for this basename: CKTOTAL, CKMB, CKMBINDEX, TROPONINI,  in the last 168 hours BNP (last 3 results) No results found for this basename: PROBNP,  in the last 8760 hours CBG: No results found for this basename: GLUCAP,  in the last 168 hours  No results found for this or any previous visit (from the past 240 hour(s)).   Studies: Dg Chest 2 View  04/07/2012  *RADIOLOGY REPORT*  Clinical Data: Chest pain, shortness of breath  CHEST - 2 VIEW  Comparison: 03/03/2012  Findings: Heart size mildly enlarged.  Central vascular congestion. Mild central peribronchial thickening.  Mild right infrahilar opacity.  No pleural effusion.  No pneumothorax.  Mild multilevel degenerative change.  IMPRESSION: Heart size upper normal to mildly enlarged with central vascular congestion.  Central peribronchial thickening may reflect acute or chronic bronchitis.  Mild  right infrahilar opacity; atelectasis versus infiltrate.   Original Report Authenticated By: Jearld Lesch, M.D.     Scheduled Meds: . aspirin EC  81 mg Oral Daily  . budesonide-formoterol  2 puff Inhalation BID  . diltiazem  120 mg Oral Daily  . insulin aspart  0-5 Units Subcutaneous QHS  . insulin aspart  0-9 Units Subcutaneous TID WC  . levofloxacin  750 mg Oral Q24H  . predniSONE  20 mg Oral BID WC  . sodium chloride  3 mL Intravenous Q12H   Continuous Infusions:   Principal Problem:   Acute asthma exacerbation Active Problems:   ASTHMA   Diabetes mellitus   CAP (community acquired pneumonia)   SOB (shortness of breath)      [name]  [facility] Pager [phone]. If 7PM-7AM, please contact night-coverage at [email or web address], password [facility] 04/08/2012, 12:27 PM  LOS: 1 day

## 2012-04-08 NOTE — H&P (Signed)
PCP:   Daisy Floro, MD   Chief Complaint:  Sob and wheezing  HPI: 47 yo female h/o asthma comes in with several days of cough, wheezing, sob no fevers.  Some congestion.  No n/v/d.  Has received several nebs in ED and she is breathing much better now but is "bouncing off the walls".  She is up pacing in the room and wired up from the albuterol.  She has not been on abx or steroids lately.  Review of Systems:  Positive and negative as per HPI otherwise all other systems are negative  Past Medical History: Past Medical History  Diagnosis Date  . Asthma    History reviewed. No pertinent past surgical history.  Medications: Prior to Admission medications   Medication Sig Start Date End Date Taking? Authorizing Provider  Multiple Vitamin (MULTIVITAMIN WITH MINERALS) TABS Take 1 tablet by mouth daily. Velocity   Yes Historical Provider, MD  naproxen sodium (ANAPROX) 220 MG tablet Take 440 mg by mouth daily as needed. For pain   Yes Historical Provider, MD  albuterol (PROVENTIL HFA;VENTOLIN HFA) 108 (90 BASE) MCG/ACT inhaler Inhale 2 puffs into the lungs every 6 (six) hours as needed. For asthma 04/07/12   Shanon Ace, MD  azithromycin (ZITHROMAX) 250 MG tablet Take 1 tablet (250 mg total) by mouth daily. Take first 2 tablets together, then 1 every day until finished. 04/07/12   Shanon Ace, MD  ipratropium-albuterol (DUONEB) 0.5-2.5 (3) MG/3ML SOLN Take 3 mLs by nebulization 2 (two) times daily as needed. For wheezing 04/07/12   Shanon Ace, MD  predniSONE (DELTASONE) 20 MG tablet Take 3 tablets (60 mg total) by mouth daily. 04/07/12 04/11/12  Shanon Ace, MD  UNABLE TO FIND 1. Glucometer 2. Blood glucose test strips # 50, check CBG BID 3. Needles # 50 09/11/11   Zannie Cove, MD  UNABLE TO FIND Outpatient Physical therapy 09/11/11   Zannie Cove, MD    Allergies:   Allergies  Allergen Reactions  . Other Hives and Itching    Patient states she is allergic  to all generic medications. Can only have name brand medicines    Social History:  reports that she has been smoking Cigarettes.  She has a 15 pack-year smoking history. She does not have any smokeless tobacco history on file. She reports that  drinks alcohol. Her drug history is not on file.  Family History: neg  Physical Exam: Filed Vitals:   04/08/12 0130 04/08/12 0200 04/08/12 0250 04/08/12 0250  BP: 154/89 155/112  179/58  Pulse: 81 93 108   Temp:      TempSrc:      Resp:   25   SpO2: 100% 100% 98%    General appearance: alert, cooperative and no distress Eyes: negative Nose: Nares normal. Septum midline. Mucosa normal. No drainage or sinus tenderness. Neck: no JVD and supple, symmetrical, trachea midline Lungs: clear to auscultation bilaterally Heart: regular rate and rhythm, S1, S2 normal, no murmur, click, rub or gallop Abdomen: soft, non-tender; bowel sounds normal; no masses,  no organomegaly Extremities: extremities normal, atraumatic, no cyanosis or edema Pulses: 2+ and symmetric Skin: Skin color, texture, turgor normal. No rashes or lesions Neurologic: Grossly normal    Labs on Admission:   Recent Labs  04/07/12 2231  NA 139  K 3.5  CL 103  GLUCOSE 215*  BUN 5*  CREATININE 1.00    Recent Labs  04/07/12 2230 04/07/12 2231  WBC 5.9  --   NEUTROABS 3.3  --  HGB 12.7 13.9  HCT 38.9 41.0  MCV 81.9  --   PLT 244  --     Radiological Exams on Admission: Dg Chest 2 View  04/07/2012  *RADIOLOGY REPORT*  Clinical Data: Chest pain, shortness of breath  CHEST - 2 VIEW  Comparison: 03/03/2012  Findings: Heart size mildly enlarged.  Central vascular congestion. Mild central peribronchial thickening.  Mild right infrahilar opacity.  No pleural effusion.  No pneumothorax.  Mild multilevel degenerative change.  IMPRESSION: Heart size upper normal to mildly enlarged with central vascular congestion.  Central peribronchial thickening may reflect acute or chronic  bronchitis.  Mild right infrahilar opacity; atelectasis versus infiltrate.   Original Report Authenticated By: Jearld Lesch, M.D.     Assessment/Plan  47 yo female with ashtma and cap Principal Problem:   Acute asthma exacerbation Active Problems:   ASTHMA   Diabetes mellitus   CAP (community acquired pneumonia)   SOB (shortness of breath)  obs on tele.  Lungs clear now.  Place on levaquin and po steroids.  Back off on the albuterol for a while, and order only prn.  Might be able to go home later today.  Full code.  DAVID,RACHAL A 04/08/2012, 3:05 AM

## 2012-04-08 NOTE — Plan of Care (Signed)
Problem: Phase I Progression Outcomes Goal: Flu/PneumoVaccines if indicated Outcome: Not Applicable Date Met:  04/08/12 Pt refused

## 2012-04-08 NOTE — ED Notes (Signed)
Dr. Norlene Campbell at bedside for reassessment.

## 2012-04-08 NOTE — ED Notes (Signed)
Respiratory notified of continuous neb tx order

## 2012-04-08 NOTE — Plan of Care (Signed)
Problem: Phase I Progression Outcomes Goal: Flu/PneumoVaccines if indicated Outcome: Not Met (add Reason) Refuses  Vaccines  Info sheets  given

## 2012-04-08 NOTE — ED Provider Notes (Signed)
Pt seen by prior team, plan was to finish 3 duonebs and plan for d/c home.  Pt with h/o asthma, smoker.  SOB/chest pressure since Sunday.  Pt with only mild improvement after 3rd neb, given CAT x 1 hour.  Wheezing improved, but not resolved, pt still dyspneic.  Will d/w hospitalist for admission.  Olivia Mackie, MD 04/08/12 [phone]

## 2012-04-09 ENCOUNTER — Other Ambulatory Visit: Payer: Self-pay | Admitting: Cardiology

## 2012-04-09 DIAGNOSIS — R079 Chest pain, unspecified: Secondary | ICD-10-CM

## 2012-04-09 DIAGNOSIS — R0602 Shortness of breath: Secondary | ICD-10-CM

## 2012-04-09 LAB — BASIC METABOLIC PANEL
GFR calc Af Amer: 85 mL/min — ABNORMAL LOW (ref >90)
GFR calc non Af Amer: 73 mL/min — ABNORMAL LOW (ref >90)
Potassium: 4 mEq/L (ref 3.5–5.1)
Sodium: 136 mEq/L (ref 135–145)

## 2012-04-09 LAB — CBC
Hemoglobin: 11.7 g/dL — ABNORMAL LOW (ref 12.0–15.0)
RBC: 4.35 MIL/uL (ref 3.87–5.11)
WBC: 19.5 10*3/uL — ABNORMAL HIGH (ref 4.0–10.5)

## 2012-04-09 LAB — GLUCOSE, CAPILLARY: Glucose-Capillary: 197 mg/dL — ABNORMAL HIGH (ref 70–99)

## 2012-04-09 MED ORDER — DILTIAZEM HCL ER COATED BEADS 120 MG PO CP24: 120.0000 mg | ORAL_CAPSULE | Freq: Every day | ORAL | Status: DC

## 2012-04-09 MED ORDER — ASPIRIN 81 MG PO TBEC: 81.0000 mg | DELAYED_RELEASE_TABLET | Freq: Every day | ORAL | Status: DC

## 2012-04-09 MED ORDER — ALBUTEROL SULFATE HFA 108 (90 BASE) MCG/ACT IN AERS: 2.0000 | INHALATION_SPRAY | Freq: Four times a day (QID) | RESPIRATORY_TRACT | Status: DC | PRN

## 2012-04-09 MED ORDER — PREDNISONE 20 MG PO TABS
20.0000 mg | ORAL_TABLET | Freq: Every day | ORAL | Status: DC
  Filled 2012-04-09: qty 1

## 2012-04-09 MED ORDER — LEVOFLOXACIN 750 MG PO TABS: 750.0000 mg | ORAL_TABLET | ORAL | Status: DC

## 2012-04-09 MED ORDER — BUDESONIDE-FORMOTEROL FUMARATE 80-4.5 MCG/ACT IN AERO: 2.0000 | INHALATION_SPRAY | Freq: Two times a day (BID) | RESPIRATORY_TRACT | Status: DC

## 2012-04-09 NOTE — Discharge Summary (Signed)
Physician Discharge Summary  Rebecca GOTTS QMV:[id number] DOB: September 16, 1965 DOA: 04/07/2012  PCP: Daisy Floro, MD  Admit date: 04/07/2012 Discharge date: 04/09/2012  Time spent: 50 minutes  Recommendations for Outpatient Follow-up:  1. Patient will followup with [facility] cardiology 2. Please refer patient to pulmonary medicine for asthma  Discharge Diagnoses:   Acute asthma exacerbation - resolved   Diabetes mellitus - mild - patient advised to stop drinking carbohydrate-containing beverages   CAP (community acquired pneumonia) - mild resolved Sinus tachycardia - probably secondary to sympathomimetic overdose - unintentionally from over use of nebulizers. Resolved Chest pain-patient will have a stress test at [facility] cardiology  Discharge Condition: good  Diet recommendation: carb modified   Filed Weights   04/08/12 0415 04/08/12 2143  Weight: 114.941 kg (253 lb 6.4 oz) 113.626 kg (250 lb 8 oz)    History of present illness:   47 yo female h/o asthma comes in with several days of cough, wheezing, sob no fevers. Some congestion. No n/v/d. Has received several nebs in ED and she is breathing much better now but is "bouncing off the walls". She is up pacing in the room and wired up from the albuterol.      Hospital Course:  1. Chest pain with EKG changes and palpitations - c/w aspirin -patient was seen in consultation by cardiology and she will undergo a outpatient Myoview stress test. She had no signs of myocardial infarction to this admission 2. Sinus tachycardia with HTN and angina - started cardizem sr 04/08/12. TSH level WNL 3. Asthma exacerbation with acute bronchitis, maybe a mild community-acquired pneumonia as well - patient received abx, nebs,  And she was placed on Symbicort which is the only pmaintenance medication she said she has tolerated in the past. Arrange outpt f/u with pulm - PCP must refer because patient is Medicaid.     Procedures: Rebecca Malone Consultations:  Cardiology   Discharge Exam: Filed Vitals:   04/08/12 2024 04/08/12 2143 04/09/12 0548 04/09/12 0908  BP:  162/73 108/84 141/59  Pulse:  86 85 84  Temp:  98.2 F (36.8 C) 97.8 F (36.6 C) 98.5 F (36.9 C)  TempSrc:  Oral Oral   Resp:  20 20 18   Height:      Weight:  113.626 kg (250 lb 8 oz)    SpO2: 97% 98% 100% 98%    General: axox3 Cardiovascular: rrr Respiratory: ctab   Discharge Instructions  Discharge Orders   Future Orders Complete By Expires     Diet - low sodium heart healthy  As directed     Increase activity slowly  As directed         Medication List    STOP taking these medications       ipratropium-albuterol 0.5-2.5 (3) MG/3ML Soln  Commonly known as:  DUONEB     UNABLE TO FIND      TAKE these medications       albuterol 108 (90 BASE) MCG/ACT inhaler  Commonly known as:  PROVENTIL HFA;VENTOLIN HFA  Inhale 2 puffs into the lungs every 6 (six) hours as needed for shortness of breath. For asthma     aspirin 81 MG EC tablet  Take 1 tablet (81 mg total) by mouth daily.     budesonide-formoterol 80-4.5 MCG/ACT inhaler  Commonly known as:  SYMBICORT  Inhale 2 puffs into the lungs 2 (two) times daily.     diltiazem 120 MG 24 hr capsule  Commonly known as:  CARDIZEM CD  Take  1 capsule (120 mg total) by mouth daily.     levofloxacin 750 MG tablet  Commonly known as:  LEVAQUIN  Take 1 tablet (750 mg total) by mouth daily.     multivitamin with minerals Tabs  Take 1 tablet by mouth daily. Velocity     naproxen sodium 220 MG tablet  Commonly known as:  ANAPROX  Take 440 mg by mouth daily as needed. For pain           Follow-up Information   Follow up with Daisy Floro, MD. (Follow up with your primary care physician in the next 1-2 days)    Contact information:   [address] [place] [address] [phone]       Follow up with Thurmon Fair, MD. (the office will call you)     Contact information:   [address] [place] [address] [phone]        The results of significant diagnostics from this hospitalization (including imaging, microbiology, ancillary and laboratory) are listed below for reference.    Significant Diagnostic Studies: Dg Chest 2 View  04/07/2012  *RADIOLOGY REPORT*  Clinical Data: Chest pain, shortness of breath  CHEST - 2 VIEW  Comparison: 03/03/2012  Findings: Heart size mildly enlarged.  Central vascular congestion. Mild central peribronchial thickening.  Mild right infrahilar opacity.  No pleural effusion.  No pneumothorax.  Mild multilevel degenerative change.  IMPRESSION: Heart size upper normal to mildly enlarged with central vascular congestion.  Central peribronchial thickening may reflect acute or chronic bronchitis.  Mild right infrahilar opacity; atelectasis versus infiltrate.   Original Report Authenticated By: Jearld Lesch, M.D.     Microbiology: No results found for this or any previous visit (from the past 240 hour(s)).   Labs: Basic Metabolic Panel:  Recent Labs Lab 04/07/12 2231 04/09/12 0540  NA 139 136  K 3.5 4.0  CL 103 103  CO2  --  21  GLUCOSE 215* 221*  BUN 5* 14  CREATININE 1.00 0.92  CALCIUM  --  9.3   Liver Function Tests: No results found for this basename: AST, ALT, ALKPHOS, BILITOT, PROT, ALBUMIN,  in the last 168 hours No results found for this basename: LIPASE, AMYLASE,  in the last 168 hours No results found for this basename: AMMONIA,  in the last 168 hours CBC:  Recent Labs Lab 04/07/12 2230 04/07/12 2231 04/09/12 0540  WBC 5.9  --  19.5*  NEUTROABS 3.3  --   --   HGB 12.7 13.9 11.7*  HCT 38.9 41.0 35.2*  MCV 81.9  --  80.9  PLT 244  --  250   Cardiac Enzymes:  Recent Labs Lab 04/08/12 1648  TROPONINI <0.30   BNP: BNP (last 3 results) No results found for this basename: PROBNP,  in the last 8760 hours CBG:  Recent Labs Lab 04/08/12 1651  04/08/12 2149 04/09/12 0734 04/09/12 1130  GLUCAP 236* 285* 197* 156*       Signed:  Laportia Carley  [facility] 04/09/2012, 1:00 PM

## 2012-04-09 NOTE — Progress Notes (Signed)
UR completed 

## 2012-04-09 NOTE — Consult Note (Signed)
Reason for Consult:Palpitations/CP  Referring Physician: Lonia Blood, MD   HPI: The patient is a 47 y/o AAF who we have been asked to see for palpations and CP. She has never seen a cardiologist. She has a h/o, asthma, T2DM, HTN and obesity. She has no known CAD. She has a family history of SCD (father at age 35). She smokes ~ 1 pack of cigarettes per week. She presented to the [facility] ER on 3/13 with complaints of worsening palpitations, SOB and left-side chest pressure, which felt like an elephant sitting on her chest. It was non-radiating. 9/10 discomfort. She has had several asthma attacks in the past, and reports that her symptoms 2 days ago were not consistent with previous attacks. She had been using her inhales at home with no relief. She had associated cough, diaphoresis, lightheadedness/dizziness and presyncope, but no syncope. She has had palpations off and on for the last 5 years, but her episodes have been worsening. They occur with both exertion and rest. She denies heat intolerances. No hematochezia, or hematuria, but reports light vaginal spotting nearly two times a week. She sees a gynecologist and states that they believe this is associated with premenopause. She denies recent anxiety and no history of anxiety attacks. In the ED, she was given multiple breathing treatments. Her SOB resolved, but her palpitations worsened. She had a heart rate of 112. Apparently, she was started on Cardizem and her heart rate improved. She has since been in NSR. She continues to endorse intermittent palpitations. Her CP has resolved.  However, there are EKG changes (TWI) that suggest ischemia.    Past Medical History  Diagnosis Date  . Asthma     History reviewed. No pertinent past surgical history.  No family history on file.  Social History:  reports that she has been smoking Cigarettes.  She has a 15 pack-year smoking history. She does not have any smokeless tobacco history on file. She reports that   drinks alcohol. Her drug history is not on file.  Allergies:  Allergies  Allergen Reactions  . Other Hives and Itching    Patient states she is allergic to all generic medications. Can only have name brand medicines    Medications:  Prior to Admission:  Prescriptions prior to admission  Medication Sig Dispense Refill  . Multiple Vitamin (MULTIVITAMIN WITH MINERALS) TABS Take 1 tablet by mouth daily. Velocity      . naproxen sodium (ANAPROX) 220 MG tablet Take 440 mg by mouth daily as needed. For pain      . UNABLE TO FIND 1. Glucometer 2. Blood glucose test strips # 50, check CBG BID 3. Needles # 50  1 each  0  . UNABLE TO FIND Outpatient Physical therapy  1 each      Results for orders placed during the hospital encounter of 04/07/12 (from the past 48 hour(s))  CBC WITH DIFFERENTIAL     Status: None   Collection Time    04/07/12 10:30 PM      Result Value Range   WBC 5.9  4.0 - 10.5 K/uL   RBC 4.75  3.87 - 5.11 MIL/uL   Hemoglobin 12.7  12.0 - 15.0 g/dL   HCT 16.1  09.6 - 04.5 %   MCV 81.9  78.0 - 100.0 fL   MCH 26.7  26.0 - 34.0 pg   MCHC 32.6  30.0 - 36.0 g/dL   RDW 40.9  81.1 - 91.4 %   Platelets 244  150 -  400 K/uL   Neutrophils Relative 56  43 - 77 %   Neutro Abs 3.3  1.7 - 7.7 K/uL   Lymphocytes Relative 34  12 - 46 %   Lymphs Abs 2.0  0.7 - 4.0 K/uL   Monocytes Relative 6  3 - 12 %   Monocytes Absolute 0.3  0.1 - 1.0 K/uL   Eosinophils Relative 4  0 - 5 %   Eosinophils Absolute 0.2  0.0 - 0.7 K/uL   Basophils Relative 0  0 - 1 %   Basophils Absolute 0.0  0.0 - 0.1 K/uL  POCT I-STAT TROPONIN I     Status: None   Collection Time    04/07/12 10:30 PM      Result Value Range   Troponin i, poc 0.04  0.00 - 0.08 ng/mL   Comment 3            Comment: Due to the release kinetics of cTnI,     a negative result within the first hours     of the onset of symptoms does not rule out     myocardial infarction with certainty.     If myocardial infarction is still  suspected,     repeat the test at appropriate intervals.  POCT I-STAT, CHEM 8     Status: Abnormal   Collection Time    04/07/12 10:31 PM      Result Value Range   Sodium 139  135 - 145 mEq/L   Potassium 3.5  3.5 - 5.1 mEq/L   Chloride 103  96 - 112 mEq/L   BUN 5 (*) 6 - 23 mg/dL   Creatinine, Ser 1.61  0.50 - 1.10 mg/dL   Glucose, Bld 096 (*) 70 - 99 mg/dL   Calcium, Ion 0.45  4.09 - 1.23 mmol/L   TCO2 28  0 - 100 mmol/L   Hemoglobin 13.9  12.0 - 15.0 g/dL   HCT 81.1  91.4 - 78.2 %  TSH     Status: None   Collection Time    04/08/12 12:35 PM      Result Value Range   TSH 0.483  0.350 - 4.500 uIU/mL  HEMOGLOBIN A1C     Status: Abnormal   Collection Time    04/08/12 12:35 PM      Result Value Range   Hemoglobin A1C 6.5 (*) <5.7 %   Comment: (NOTE)                                                                               According to the ADA Clinical Practice Recommendations for 2011, when     HbA1c is used as a screening test:      >=6.5%   Diagnostic of Diabetes Mellitus               (if abnormal result is confirmed)     5.7-6.4%   Increased risk of developing Diabetes Mellitus     References:Diagnosis and Classification of Diabetes Mellitus,Diabetes     Care,2011,34(Suppl 1):S62-S69 and Standards of Medical Care in             Diabetes - 2011,Diabetes Care,2011,34 (Suppl 1):S11-S61.   Mean  Plasma Glucose 140 (*) <117 mg/dL  TROPONIN I     Status: None   Collection Time    04/08/12  4:48 PM      Result Value Range   Troponin I <0.30  <0.30 ng/mL   Comment:            Due to the release kinetics of cTnI,     a negative result within the first hours     of the onset of symptoms does not rule out     myocardial infarction with certainty.     If myocardial infarction is still suspected,     repeat the test at appropriate intervals.  GLUCOSE, CAPILLARY     Status: Abnormal   Collection Time    04/08/12  4:51 PM      Result Value Range   Glucose-Capillary 236 (*)  70 - 99 mg/dL  GLUCOSE, CAPILLARY     Status: Abnormal   Collection Time    04/08/12  9:49 PM      Result Value Range   Glucose-Capillary 285 (*) 70 - 99 mg/dL  BASIC METABOLIC PANEL     Status: Abnormal   Collection Time    04/09/12  5:40 AM      Result Value Range   Sodium 136  135 - 145 mEq/L   Potassium 4.0  3.5 - 5.1 mEq/L   Chloride 103  96 - 112 mEq/L   CO2 21  19 - 32 mEq/L   Glucose, Bld 221 (*) 70 - 99 mg/dL   BUN 14  6 - 23 mg/dL   Creatinine, Ser 1.61  0.50 - 1.10 mg/dL   Calcium 9.3  8.4 - 09.6 mg/dL   GFR calc non Af Amer 73 (*) >90 mL/min   GFR calc Af Amer 85 (*) >90 mL/min   Comment:            The eGFR has been calculated     using the CKD EPI equation.     This calculation has not been     validated in all clinical     situations.     eGFR's persistently     <90 mL/min signify     possible Chronic Kidney Disease.  CBC     Status: Abnormal   Collection Time    04/09/12  5:40 AM      Result Value Range   WBC 19.5 (*) 4.0 - 10.5 K/uL   RBC 4.35  3.87 - 5.11 MIL/uL   Hemoglobin 11.7 (*) 12.0 - 15.0 g/dL   HCT 04.5 (*) 40.9 - 81.1 %   MCV 80.9  78.0 - 100.0 fL   MCH 26.9  26.0 - 34.0 pg   MCHC 33.2  30.0 - 36.0 g/dL   RDW 91.4  78.2 - 95.6 %   Platelets 250  150 - 400 K/uL  GLUCOSE, CAPILLARY     Status: Abnormal   Collection Time    04/09/12  7:34 AM      Result Value Range   Glucose-Capillary 197 (*) 70 - 99 mg/dL    Dg Chest 2 View  03/11/863  *RADIOLOGY REPORT*  Clinical Data: Chest pain, shortness of breath  CHEST - 2 VIEW  Comparison: 03/03/2012  Findings: Heart size mildly enlarged.  Central vascular congestion. Mild central peribronchial thickening.  Mild right infrahilar opacity.  No pleural effusion.  No pneumothorax.  Mild multilevel degenerative change.  IMPRESSION: Heart size upper normal to mildly enlarged with central  vascular congestion.  Central peribronchial thickening may reflect acute or chronic bronchitis.  Mild right infrahilar  opacity; atelectasis versus infiltrate.   Original Report Authenticated By: Jearld Lesch, M.D.     Review of Systems  Constitutional: Positive for diaphoresis. Negative for fever, chills and malaise/fatigue.  HENT: Positive for congestion.   Respiratory: Positive for cough, shortness of breath and wheezing.   Cardiovascular: Positive for chest pain, palpitations, orthopnea and leg swelling. Negative for PND.  Gastrointestinal: Negative for nausea, vomiting, abdominal pain, diarrhea, constipation, blood in stool and melena.  Genitourinary: Negative for hematuria.  Neurological: Positive for dizziness. Negative for loss of consciousness and weakness.   Blood pressure 141/59, pulse 84, temperature 98.5 F (36.9 C), temperature source Oral, resp. rate 18, height 5\' 5"  (1.651 m), weight 250 lb 8 oz (113.626 kg), SpO2 98.00%. Physical Exam  Constitutional: She is oriented to person, place, and time. She appears well-developed and well-nourished. No distress.  HENT:  Head: Normocephalic and atraumatic.  Eyes: Conjunctivae and EOM are normal. Pupils are equal, round, and reactive to light.  Neck: Normal range of motion. Neck supple. No JVD present. No thyromegaly present.  Cardiovascular: Normal rate, regular rhythm, normal heart sounds and intact distal pulses.  Exam reveals no gallop and no friction rub.   No murmur heard. Respiratory: Effort normal and breath sounds normal. No respiratory distress. She has no wheezes. She has no rales. She exhibits no tenderness.  GI: Soft. Bowel sounds are normal. She exhibits no distension and no mass. There is no tenderness.  Musculoskeletal: Normal range of motion. She exhibits no edema.  Lymphadenopathy:    She has no cervical adenopathy.  Neurological: She is alert and oriented to person, place, and time.  Skin: Skin is warm. She is not diaphoretic.  Psychiatric: She has a normal mood and affect. Her behavior is normal.     Assessment/Plan: Principal Problem:   Acute asthma exacerbation Active Problems:   ASTHMA   Diabetes mellitus   CAP (community acquired pneumonia)   SOB (shortness of breath)  Plan: Pt continues to endorse intermittent palpitations. TSH was WNL. She is currently in NSR on telemetry w/ HR in the 70's.  She did have some sinus tach earlier this am with HR of 120 + PVCs. I agree with Cardizem as this is a good option for rate control in the setting of asthma. Continue with current dose of 120 mg PO daily. Keep on Xopenex for asthma, (less reflex tachycardia than albuterol). Considering TWI on EKG, risk factors and family history, would recommend a stress test. This can be done as an OP. Would refrain from Lexiscan due to asthma. Troponin negative x 1. MD to follow.   Allayne Butcher, PA-C 04/09/2012, 10:59 AM   I have seen and examined the patient along with BRITTAINY M. SIMMONS, PA-C.  I have reviewed the chart, notes and new data.  I agree with PA's note.  Key new complaints: no chest pain, wheezing has resolved Key examination changes: other than obesity, her exam is normal - no carotid bruits, all pulses 3+, clear lungs, RRR w/o murmur or gallop, no edema or JVD Key new findings / data: ECG from this admission suggests LVH w repol (voltage masked by obesity) and is identical with the one from 08/2011. Puzzling is the interevning ECG from February that looks very different from the others. Tachycardia is due to hyperadrenergic state/beta agonist meds.  PLAN: I think it is more likely that her symptoms  are non cardiac, but the ECG changes need to be explained. Recommend a nuclear perfusion study and an echo (to look for LVH and ischemia respectively). She prefers to do these studies as an outpatient. As she is asymptomatic and enzymes are normal, I think outpatient workup can be appropriate.  Thurmon Fair, MD, [facility] [phone] 04/09/2012,  12:49 PM

## 2012-04-14 LAB — CULTURE, BLOOD (ROUTINE X 2): Culture: NO GROWTH

## 2012-04-22 ENCOUNTER — Ambulatory Visit (HOSPITAL_COMMUNITY)
Admission: RE | Admit: 2012-04-22 | Discharge: 2012-04-22 | Disposition: A | Discharge status: Home / Self Care | Payer: Medicaid Other | Source: Ambulatory Visit | Attending: Cardiovascular Disease | Admitting: Cardiovascular Disease

## 2012-04-22 ENCOUNTER — Telehealth: Payer: Self-pay | Admitting: Family Medicine

## 2012-04-22 DIAGNOSIS — R0989 Other specified symptoms and signs involving the circulatory and respiratory systems: Secondary | ICD-10-CM | POA: Insufficient documentation

## 2012-04-22 DIAGNOSIS — R0602 Shortness of breath: Secondary | ICD-10-CM

## 2012-04-22 DIAGNOSIS — I10 Essential (primary) hypertension: Secondary | ICD-10-CM | POA: Insufficient documentation

## 2012-04-22 DIAGNOSIS — R42 Dizziness and giddiness: Secondary | ICD-10-CM | POA: Insufficient documentation

## 2012-04-22 DIAGNOSIS — R079 Chest pain, unspecified: Secondary | ICD-10-CM

## 2012-04-22 DIAGNOSIS — R0609 Other forms of dyspnea: Secondary | ICD-10-CM | POA: Insufficient documentation

## 2012-04-22 DIAGNOSIS — E119 Type 2 diabetes mellitus without complications: Secondary | ICD-10-CM | POA: Insufficient documentation

## 2012-04-22 DIAGNOSIS — R002 Palpitations: Secondary | ICD-10-CM | POA: Insufficient documentation

## 2012-04-22 DIAGNOSIS — R5381 Other malaise: Secondary | ICD-10-CM | POA: Insufficient documentation

## 2012-04-22 DIAGNOSIS — F17200 Nicotine dependence, unspecified, uncomplicated: Secondary | ICD-10-CM | POA: Insufficient documentation

## 2012-04-22 MED ORDER — TECHNETIUM TC 99M SESTAMIBI GENERIC - CARDIOLITE
11.0000 | Freq: Once | INTRAVENOUS | Status: AC | PRN
  Administered 2012-04-22: 11 via INTRAVENOUS

## 2012-04-22 MED ORDER — TECHNETIUM TC 99M SESTAMIBI GENERIC - CARDIOLITE
31.6000 | Freq: Once | INTRAVENOUS | Status: AC | PRN
  Administered 2012-04-22: 32 via INTRAVENOUS

## 2012-04-22 NOTE — Progress Notes (Signed)
[facility]   2D echo completed 04/22/2012.   Cindy Haruo Stepanek, RDCS  

## 2012-04-22 NOTE — Telephone Encounter (Signed)
Rebecca Malone is calling with one more question for [name].

## 2012-04-22 NOTE — Telephone Encounter (Signed)
Kim from [facility] calls requesting NPI # for [place]. Advised she is not a patient of [facility] .  However we are on her medicaid card . Consulted with Erie Noe and she advises we can give a one time authorization and have patient get our name removed from her card.

## 2012-04-22 NOTE — Procedures (Addendum)
[facility] [address] [place] [place] [address] [phone]  Cardiology Nuclear Med Study  Rebecca Malone is a 47 y.o. female     MRN : [id number]     DOB: 04-10-1965  Procedure Date: 04/22/2012  Nuclear Med Background Indication for Stress Test:  Evaluation for Ischemia and Abnormal EKG History:  Asthma and MI Cardiac Risk Factors: Family History - CAD, Hypertension, Lipids, NIDDM, Obesity and Smoker  Symptoms:  Chest Pain, DOE, Fatigue, Light-Headedness, Palpitations and SOB   Nuclear Pre-Procedure Caffeine/Decaff Intake:  1:00am NPO After: 11 AM   IV Site: R Hand  IV 0.9% NS with Angio Cath:  22g  Chest Size (in):  N/A IV Started by: Emmit Pomfret, RN  Height: 5\' 5"  (1.651 m)  Cup Size: D  BMI:  Body mass index is 41.6 kg/(m^2). Weight:  250 lb (113.399 kg)   Tech Comments:  N/A    Nuclear Med Study 1 or 2 day study: 1 day  Stress Test Type:  Stress  Order Authorizing Provider:  MIHAI CROITORU,MD   Resting Radionuclide: Technetium 35m Sestamibi  Resting Radionuclide Dose: 11.0 mCi   Stress Radionuclide:  Technetium 56m Sestamibi  Stress Radionuclide Dose: 31.6 mCi           Stress Protocol Rest HR: 83 Stress HR: 153  Rest BP: 169/82 Stress BP:213/127  Exercise Time (min): 5:17 METS: 6.30   Predicted Max HR: 173 bpm % Max HR: 88.44 bpm Rate Pressure Product: 01027  Dose of Adenosine (mg):  n/a Dose of Lexiscan: n/a mg  Dose of Atropine (mg): n/a Dose of Dobutamine: n/a mcg/kg/min (at max HR)  Stress Test Technologist: Ernestene Mention, CCT Nuclear Technologist: Gonzella Lex, CNMT   Rest Procedure:  Myocardial perfusion imaging was performed at rest 45 minutes following the intravenous administration of Technetium 39m Sestamibi. Stress Procedure:  The patient performed treadmill exercise using a Bruce  Protocol for 5 minutes and 17 seconds. The patient stopped due to fatigue and shortness of breath.Patient  denied any chest pain.  There were no significant ST-T wave changes.  Technetium 67m Sestamibi was injected at peak exercise and myocardial perfusion imaging was performed after a brief delay.  Transient Ischemic Dilatation (Normal <1.22):  0.83 Lung/Heart Ratio (Normal <0.45):  0.18 QGS EDV:  85 ml QGS ESV:  31 ml LV Ejection Fraction: 64%  Signed by      Rest ECG: NSR, mild nonspecific inferolateral T changes  Stress ECG:  0.5 - 1.0 mm ST changes in baseline abnormal leads without significant additional ST segment change suggestive of ischemia  QPS Raw Data Images:  Normal; no motion artifact; normal heart/lung ratio. Stress Images:  Normal homogeneous uptake in all areas of the myocardium. Rest Images:  Normal homogeneous uptake in all areas of the myocardium. Subtraction (SDS):  Normal  Impression Exercise Capacity:  Fair exercise capacity. BP Response:  Hypertensive blood pressure response. Clinical Symptoms:  Mild shortness of breath ECG Impression:  No significant ST segment change suggestive of ischemia. Comparison with Prior Nuclear Study: No previous nuclear study performed  Overall Impression:  Normal stress nuclear study. Low risk stress nuclear study.  LV Wall Motion:  NL LV Function, EF 64%; NL Wall Motion   [name], MD  04/22/2012 3:34 PM

## 2012-06-06 ENCOUNTER — Encounter: Payer: Self-pay | Admitting: *Deleted

## 2012-06-07 ENCOUNTER — Encounter: Payer: Self-pay | Admitting: Cardiology

## 2012-06-07 ENCOUNTER — Ambulatory Visit (INDEPENDENT_AMBULATORY_CARE_PROVIDER_SITE_OTHER): Payer: Medicaid Other | Admitting: Cardiology

## 2012-06-07 VITALS — BP 140/86 | HR 86 | Ht 64.0 in | Wt 250.0 lb

## 2012-06-07 DIAGNOSIS — F17210 Nicotine dependence, cigarettes, uncomplicated: Secondary | ICD-10-CM | POA: Insufficient documentation

## 2012-06-07 DIAGNOSIS — R002 Palpitations: Secondary | ICD-10-CM | POA: Insufficient documentation

## 2012-06-07 DIAGNOSIS — J45909 Unspecified asthma, uncomplicated: Secondary | ICD-10-CM

## 2012-06-07 DIAGNOSIS — F17200 Nicotine dependence, unspecified, uncomplicated: Secondary | ICD-10-CM

## 2012-06-07 NOTE — Assessment & Plan Note (Signed)
1 pack a week

## 2012-06-07 NOTE — Assessment & Plan Note (Signed)
BMI 42 

## 2012-06-07 NOTE — Assessment & Plan Note (Signed)
Chronic, on inhalers

## 2012-06-07 NOTE — Patient Instructions (Addendum)
Wear monitor for 2wks, please don't forget to change batteries on a daily basis, Follow up with Dr Royann Shivers in 4 weeks

## 2012-06-07 NOTE — Progress Notes (Signed)
06/07/2012 Rebecca Malone   September 06, 1965  [id number]  Primary Physician none Primary Cardiologist: Dr Royann Shivers  HPI:  47 y/o female who we saw in the Hospital in March in the hospital in consult. She says she had a blood transfusion in 2011 when she was admitted for uterine fibroids and anemia. According to her she has "not been right since". She has had recurrent Lt arm numbness and palpations. She has been in the hospital for this twice in the past year. Myoview and echo were unremarkable this past admission. She is here as a post hospital follow up. She say her heart skips. She said it was doing this in the office and I examined her and she appeared to be in NSR. She says her lt arm becomes numb suddenly. I asked if this was worse at night or when she sleeps "I don't sleep, I stare at the ceiling all night". She has a history of asthma and uses inhalers daily.   Current Outpatient Prescriptions  Medication Sig Dispense Refill  . albuterol (PROVENTIL HFA;VENTOLIN HFA) 108 (90 BASE) MCG/ACT inhaler Inhale 2 puffs into the lungs every 6 (six) hours as needed for shortness of breath. For asthma  6.7 g  1  . aspirin EC 81 MG EC tablet Take 1 tablet (81 mg total) by mouth daily.      . budesonide-formoterol (SYMBICORT) 80-4.5 MCG/ACT inhaler Inhale 2 puffs into the lungs 2 (two) times daily.  1 Inhaler  0  . diltiazem (CARDIZEM CD) 120 MG 24 hr capsule Take 1 capsule (120 mg total) by mouth daily.  30 capsule  0  . Multiple Vitamin (MULTIVITAMIN WITH MINERALS) TABS Take 1 tablet by mouth daily. Velocity      . naproxen sodium (ANAPROX) 220 MG tablet Take 440 mg by mouth daily as needed. For pain       No current facility-administered medications for this visit.    Allergies  Allergen Reactions  . Advair Diskus (Fluticasone-Salmeterol) Other (See Comments)    Made sores on the back of her throat  . Other Hives and Itching    Patient states she is allergic to all generic medications. Can only  have name brand medicines    History   Social History  . Marital Status: Divorced    Spouse Name: N/A    Number of Children: N/A  . Years of Education: N/A   Occupational History  . Not on file.   Social History Main Topics  . Smoking status: Current Every Day Smoker -- 1.00 packs/day for 15 years    Types: Cigarettes  . Smokeless tobacco: Not on file  . Alcohol Use: Yes  . Drug Use:   . Sexually Active: Yes   Other Topics Concern  . Not on file   Social History Narrative  . No narrative on file     Review of Systems: General: negative for chills, fever, night sweats or weight changes.  Cardiovascular: negative for chest pain, dyspnea on exertion, edema, orthopnea, palpitations, paroxysmal nocturnal dyspnea or shortness of breath Dermatological: negative for rash Respiratory: negative for cough or wheezing Urologic: negative for hematuria Abdominal: negative for nausea, vomiting, diarrhea, bright red blood per rectum, melena, or hematemesis Neurologic: negative for visual changes, syncope, or dizziness All other systems reviewed and are otherwise negative except as noted above.    Blood pressure 140/86, pulse 86, height 5\' 4"  (1.626 m), weight 250 lb (113.399 kg).  General appearance: alert, cooperative, no distress and morbidly obese  Lungs: clear to auscultation bilaterally Heart: regular rate and rhythm  EKG  NSST changes on hospital EKG.  ASSESSMENT AND PLAN:   Palpitations Persistent complaint for past several months  ASTHMA Chronic, on inhalers  OBESITY, MORBID BMI 42  Smoker 1 pack a week   PLAN  She seems to be doing better from a standpoint of her palpitations on Diltiazem though she still has them. I though she may have carpal tunnel causing numbness in her Lt arm but the pt discounts this diagnosis. I ordered a two week event monitor to see if she is in fact having any arrythmia. She will follow up with the MD who saw in the hospital after this  to see if she needs any further cardiac work up. She is no longer seeing her former Primary Care MD and I encouraged her to get established with someone. I counseled her on smoking.   [facility] 06/07/2012 12:36 PM

## 2012-06-07 NOTE — Assessment & Plan Note (Signed)
Persistent complaint for past several months

## 2012-06-23 ENCOUNTER — Telehealth: Payer: Self-pay | Admitting: *Deleted

## 2012-06-23 NOTE — Telephone Encounter (Signed)
LMOM with normal results of Event Monitor week of 5/13-26/14

## 2012-07-05 ENCOUNTER — Encounter: Payer: Self-pay | Admitting: Cardiovascular Disease

## 2012-07-19 ENCOUNTER — Ambulatory Visit: Payer: Medicaid Other | Admitting: Cardiovascular Disease

## 2012-08-12 ENCOUNTER — Ambulatory Visit: Payer: Medicaid Other | Admitting: Cardiovascular Disease

## 2012-10-08 ENCOUNTER — Emergency Department (HOSPITAL_COMMUNITY): Payer: Medicaid Other

## 2012-10-08 ENCOUNTER — Encounter (HOSPITAL_COMMUNITY): Payer: Self-pay | Admitting: Emergency Medicine

## 2012-10-08 ENCOUNTER — Emergency Department (HOSPITAL_COMMUNITY)
Admission: EM | Admit: 2012-10-08 | Discharge: 2012-10-08 | Disposition: A | Discharge status: Home / Self Care | Payer: Medicaid Other | Attending: Emergency Medicine | Admitting: Emergency Medicine

## 2012-10-08 DIAGNOSIS — R0789 Other chest pain: Secondary | ICD-10-CM | POA: Insufficient documentation

## 2012-10-08 DIAGNOSIS — E669 Obesity, unspecified: Secondary | ICD-10-CM | POA: Insufficient documentation

## 2012-10-08 DIAGNOSIS — F17200 Nicotine dependence, unspecified, uncomplicated: Secondary | ICD-10-CM | POA: Insufficient documentation

## 2012-10-08 DIAGNOSIS — R110 Nausea: Secondary | ICD-10-CM | POA: Insufficient documentation

## 2012-10-08 DIAGNOSIS — R079 Chest pain, unspecified: Secondary | ICD-10-CM

## 2012-10-08 DIAGNOSIS — Z3202 Encounter for pregnancy test, result negative: Secondary | ICD-10-CM | POA: Insufficient documentation

## 2012-10-08 DIAGNOSIS — Z79899 Other long term (current) drug therapy: Secondary | ICD-10-CM | POA: Insufficient documentation

## 2012-10-08 DIAGNOSIS — Z7982 Long term (current) use of aspirin: Secondary | ICD-10-CM | POA: Insufficient documentation

## 2012-10-08 DIAGNOSIS — Z760 Encounter for issue of repeat prescription: Secondary | ICD-10-CM | POA: Insufficient documentation

## 2012-10-08 DIAGNOSIS — J45909 Unspecified asthma, uncomplicated: Secondary | ICD-10-CM | POA: Insufficient documentation

## 2012-10-08 DIAGNOSIS — I10 Essential (primary) hypertension: Secondary | ICD-10-CM | POA: Insufficient documentation

## 2012-10-08 HISTORY — DX: Obesity, unspecified: E66.9

## 2012-10-08 LAB — BASIC METABOLIC PANEL
BUN: 11 mg/dL (ref 6–23)
CO2: 24 mEq/L (ref 19–32)
Chloride: 102 mEq/L (ref 96–112)
Creatinine, Ser: 1.04 mg/dL (ref 0.50–1.10)
Potassium: 3.5 mEq/L (ref 3.5–5.1)

## 2012-10-08 LAB — CBC
HCT: 35.5 % — ABNORMAL LOW (ref 36.0–46.0)
MCV: 77.3 fL — ABNORMAL LOW (ref 78.0–100.0)
RBC: 4.59 MIL/uL (ref 3.87–5.11)
WBC: 6.5 10*3/uL (ref 4.0–10.5)

## 2012-10-08 LAB — POCT PREGNANCY, URINE: Preg Test, Ur: NEGATIVE

## 2012-10-08 LAB — PRO B NATRIURETIC PEPTIDE: Pro B Natriuretic peptide (BNP): 105.5 pg/mL (ref 0–125)

## 2012-10-08 LAB — POCT I-STAT TROPONIN I

## 2012-10-08 MED ORDER — DILTIAZEM HCL 120 MG PO TABS: 120.0000 mg | ORAL_TABLET | Freq: Every day | ORAL | Status: DC

## 2012-10-08 MED ORDER — ASPIRIN 81 MG PO CHEW
324.0000 mg | CHEWABLE_TABLET | Freq: Once | ORAL | Status: AC
  Administered 2012-10-08: 324 mg via ORAL

## 2012-10-08 MED ORDER — MORPHINE SULFATE 2 MG/ML IJ SOLN
2.0000 mg | Freq: Once | INTRAMUSCULAR | Status: AC
  Administered 2012-10-08: 2 mg via INTRAVENOUS
  Filled 2012-10-08: qty 1

## 2012-10-08 MED ORDER — HYDROCODONE-ACETAMINOPHEN 5-325 MG PO TABS: 2.0000 | ORAL_TABLET | ORAL | Status: DC | PRN

## 2012-10-08 NOTE — ED Provider Notes (Signed)
CSN: [id number]     Arrival date & time 10/08/12  0106 History   First MD Initiated Contact with Patient 10/08/12 0152     Chief Complaint  Patient presents with  . Chest Pain   (Consider location/radiation/quality/duration/timing/severity/associated sxs/prior Treatment) HPI Comments: 47 yo AA female presents with CP.  O: 1900 P: Better with lying still Q: ache R: L chest; no radiation S: moderate T: n/a  Pt took an ASA in the AM on 10/08/12.  Pt was brought by her mother.  Took no medications.  Pt states she was admitted in March 2014 for her "heart".  She is followed by [facility].  Normal Stress Nuclear study in March 2014.    She has "run out of her diltiazem - her dose is 120mg  daily.  She is requesting a refill.  She is also requesting a refill of her norco.    Patient is a 47 y.o. female presenting with chest pain. The history is provided by the patient.  Chest Pain Pain location:  L chest Pain quality: aching and dull   Pain radiates to:  Does not radiate Pain radiates to the back: no   Pain severity:  Moderate Onset quality:  Gradual Duration:  7 hours Timing:  Constant Progression:  Improving Chronicity:  Recurrent Associated symptoms: nausea   Associated symptoms: no abdominal pain, no cough, no diaphoresis, no fatigue, no fever, no palpitations, no shortness of breath and not vomiting     Past Medical History  Diagnosis Date  . Asthma   . Tachycardia march 2014    admitted to hospital  . Chest pain march 2014    admitted to hospital   . Hypertension   . Obesity    History reviewed. No pertinent past surgical history. No family history on file. History  Substance Use Topics  . Smoking status: Current Every Day Smoker -- 1.00 packs/day for 15 years    Types: Cigarettes  . Smokeless tobacco: Not on file  . Alcohol Use: Yes   OB History   Grav Para Term Preterm Abortions TAB SAB Ect Mult Living                 Review of Systems   Constitutional: Negative.  Negative for fever, chills, diaphoresis, activity change, appetite change and fatigue.  HENT: Negative.   Eyes: Negative.   Respiratory: Negative for apnea, cough, choking, chest tightness, shortness of breath and stridor.   Cardiovascular: Positive for chest pain. Negative for palpitations and leg swelling.  Gastrointestinal: Positive for nausea. Negative for vomiting, abdominal pain, diarrhea, constipation, blood in stool, abdominal distention, anal bleeding and rectal pain.  Endocrine: Negative.   Genitourinary: Negative.        Irregular menses.  Pt suspects menopause.    Musculoskeletal: Negative.   Skin: Negative.   Neurological: Negative.   Hematological: Negative.     Allergies  Advair diskus and Other  Home Medications   Current Outpatient Rx  Name  Route  Sig  Dispense  Refill  . albuterol (PROVENTIL HFA;VENTOLIN HFA) 108 (90 BASE) MCG/ACT inhaler   Inhalation   Inhale 2 puffs into the lungs every 6 (six) hours as needed for shortness of breath. For asthma   6.7 g   1   . aspirin EC 81 MG EC tablet   Oral   Take 1 tablet (81 mg total) by mouth daily.         . budesonide-formoterol (SYMBICORT) 80-4.5 MCG/ACT inhaler  Inhalation   Inhale 2 puffs into the lungs 2 (two) times daily.   1 Inhaler   0   . diltiazem (CARDIZEM CD) 120 MG 24 hr capsule   Oral   Take 1 capsule (120 mg total) by mouth daily.   30 capsule   0   . levofloxacin (LEVAQUIN) 750 MG tablet   Oral   Take 750 mg by mouth daily.         . Multiple Vitamin (MULTIVITAMIN WITH MINERALS) TABS   Oral   Take 1 tablet by mouth daily. Velocity         . naproxen sodium (ANAPROX) 220 MG tablet   Oral   Take 440 mg by mouth daily as needed. For pain         . diltiazem (CARDIZEM) 120 MG tablet   Oral   Take 1 tablet (120 mg total) by mouth daily.   30 tablet   0   . HYDROcodone-acetaminophen (NORCO/VICODIN) 5-325 MG per tablet   Oral   Take 2 tablets by  mouth every 4 (four) hours as needed for pain.   6 tablet   0    BP 112/57  Pulse 69  Temp(Src) 98.8 F (37.1 C) (Oral)  Resp 18  Wt 255 lb 4.8 oz (115.803 kg)  BMI 43.8 kg/m2  SpO2 93%  LMP 09/30/2012 Physical Exam  Constitutional: She is oriented to person, place, and time. She appears well-developed and well-nourished.  HENT:  Head: Normocephalic and atraumatic.  Eyes: Conjunctivae are normal. Pupils are equal, round, and reactive to light.  Neck: Normal range of motion. Neck supple.  Cardiovascular: Normal rate and regular rhythm.  Exam reveals no gallop and no friction rub.   No murmur heard. Pulmonary/Chest: Effort normal and breath sounds normal. No respiratory distress. She has no wheezes. She has no rales. She exhibits no tenderness.  Abdominal: Soft. Bowel sounds are normal. She exhibits no distension and no mass. There is no tenderness. There is no rebound and no guarding.  Musculoskeletal: Normal range of motion. She exhibits no edema and no tenderness.  Neurological: She is alert and oriented to person, place, and time. She has normal reflexes.    ED Course  Procedures (including critical care time) Labs Review Labs Reviewed  CBC - Abnormal; Notable for the following:    Hemoglobin 11.1 (*)    HCT 35.5 (*)    MCV 77.3 (*)    MCH 24.2 (*)    RDW 18.5 (*)    All other components within normal limits  BASIC METABOLIC PANEL - Abnormal; Notable for the following:    Glucose, Bld 116 (*)    GFR calc non Af Amer 63 (*)    GFR calc Af Amer 73 (*)    All other components within normal limits  PRO B NATRIURETIC PEPTIDE  TROPONIN I  POCT I-STAT TROPONIN I  POCT PREGNANCY, URINE   Imaging Review Dg Chest 2 View  10/08/2012   *RADIOLOGY REPORT*  Clinical Data: Chest pain  CHEST - 2 VIEW  Comparison: Radiograph from 04/07/2012  Findings: The transverse heart size is again at the upper limits of normal, unchanged as compared to the prior examination.  The lungs are  normally inflated.  No airspace consolidation, pleural effusion, or pulmonary edema is identified.  There is no pneumothorax.  No acute osseous abnormality.  IMPRESSION: No acute cardiopulmonary process.   Original Report Authenticated By: Rise Mu, M.D.   Results for orders placed during  the hospital encounter of 10/08/12  CBC      Result Value Range   WBC 6.5  4.0 - 10.5 K/uL   RBC 4.59  3.87 - 5.11 MIL/uL   Hemoglobin 11.1 (*) 12.0 - 15.0 g/dL   HCT 47.8 (*) 29.5 - 62.1 %   MCV 77.3 (*) 78.0 - 100.0 fL   MCH 24.2 (*) 26.0 - 34.0 pg   MCHC 31.3  30.0 - 36.0 g/dL   RDW 30.8 (*) 65.7 - 84.6 %   Platelets 321  150 - 400 K/uL  BASIC METABOLIC PANEL      Result Value Range   Sodium 139  135 - 145 mEq/L   Potassium 3.5  3.5 - 5.1 mEq/L   Chloride 102  96 - 112 mEq/L   CO2 24  19 - 32 mEq/L   Glucose, Bld 116 (*) 70 - 99 mg/dL   BUN 11  6 - 23 mg/dL   Creatinine, Ser 9.62  0.50 - 1.10 mg/dL   Calcium 9.3  8.4 - 95.2 mg/dL   GFR calc non Af Amer 63 (*) >90 mL/min   GFR calc Af Amer 73 (*) >90 mL/min  PRO B NATRIURETIC PEPTIDE      Result Value Range   Pro B Natriuretic peptide (BNP) 105.5  0 - 125 pg/mL  TROPONIN I      Result Value Range   Troponin I <0.30  <0.30 ng/mL  POCT I-STAT TROPONIN I      Result Value Range   Troponin i, poc 0.01  0.00 - 0.08 ng/mL   Comment 3           POCT PREGNANCY, URINE      Result Value Range   Preg Test, Ur NEGATIVE  NEGATIVE    Date: 10/08/2012  Rate: 86  Rhythm: normal sinus rhythm  QRS Axis: normal  Intervals: normal  ST/T Wave abnormalities: nonspecific T wave changes in I and aVL  Conduction Disutrbances:none  Narrative Interpretation:   Old EKG Reviewed: none available   MDM   1. Chest pain    47 yo AA female presents to ER with cc of CP.  Pt's cp is atypical.  ER w/u negative to include EKG, Trop x 2, monitoring, CXR.  Pt had negative Stress Nuc imaging in March 2014.  Doubt ACS, AMI, PE, PTx, pneumonia, SBI, AD, or  other emergent etiology.  Atypical CP.  Symptoms improved in ER.  VSS.   The patient appears reasonably screened and/or stabilized for discharge and I doubt any other medical condition or other [facility] requiring further screening, evaluation, or treatment in the ED at this time prior to discharge.  Cardizem refilled.  I did not refill her norco.  ER precautions given.  PCM f/u in 1-3 days.  Pt agrees with plan.    Darlys Gales, MD 10/08/12 1038

## 2012-10-08 NOTE — ED Notes (Signed)
Friday night around 7pm pt states she started experiencing chest pain and extreme SOB. She states her left arm fingers went numb.

## 2012-10-08 NOTE — ED Notes (Signed)
Pt. reports left chest pain with sob onset 7 pm last night , denies nausea or diaphoresis .

## 2013-01-10 ENCOUNTER — Ambulatory Visit: Payer: Medicaid Other | Admitting: Cardiovascular Disease

## 2013-01-11 ENCOUNTER — Ambulatory Visit: Payer: Medicaid Other | Admitting: Cardiovascular Disease

## 2013-04-20 ENCOUNTER — Ambulatory Visit (INDEPENDENT_AMBULATORY_CARE_PROVIDER_SITE_OTHER): Payer: Medicaid Other | Admitting: Cardiovascular Disease

## 2013-04-20 ENCOUNTER — Encounter: Payer: Self-pay | Admitting: Cardiovascular Disease

## 2013-04-20 VITALS — BP 130/80 | HR 90 | Resp 16 | Ht 65.0 in | Wt 264.6 lb

## 2013-04-20 DIAGNOSIS — R002 Palpitations: Secondary | ICD-10-CM

## 2013-04-20 DIAGNOSIS — R0602 Shortness of breath: Secondary | ICD-10-CM

## 2013-04-20 MED ORDER — METOPROLOL SUCCINATE ER 50 MG PO TB24: 50.0000 mg | ORAL_TABLET | Freq: Every day | ORAL | Status: DC

## 2013-04-20 NOTE — Patient Instructions (Signed)
STOP Diltiazem.  START Toprol XL 50mg  once a day.  Your physician has recommended that you wear a holter monitor for 48 hours. Holter monitors are medical devices that record the heart's electrical activity. Doctors most often use these monitors to diagnose arrhythmias. Arrhythmias are problems with the speed or rhythm of the heartbeat. The monitor is a small, portable device. You can wear one while you do your normal daily activities. This is usually used to diagnose what is causing palpitations/syncope (passing out).   Your physician recommends that you schedule a follow-up appointment in: ONE MONTH.

## 2013-04-22 ENCOUNTER — Encounter: Payer: Self-pay | Admitting: Cardiovascular Disease

## 2013-04-22 NOTE — Progress Notes (Signed)
Patient ID: Rebecca Malone, female   DOB: 02/12/1965, 48 y.o.   MRN: [id number]     Reason for office visit Palpitations  She was seen last year here in the office. Wore an event monitor for 2 weeks showed isolated PACs and PVCs. She had a normal stress myocardial perfusion study and a normal echocardiogram about a year ago. Had good relief of palpitations with a calcium channel blocker. Ran out of Diltiazem in November 2014. Having episodes of a rapid, sometimes irregular, beat daily for months. Becomes dyspneic with minimal activity, but this does not appear to be different from previous complaints. She occasionally develops dizziness and blurred vision but has not had syncope.  She is obese and has asthma. She continues to smoke despite this. She is well treated hypertension but no other coronary risk factors  Allergies  Allergen Reactions  . Advair Diskus [Fluticasone-Salmeterol] Other (See Comments)    Made sores on the back of her throat  . Other Hives and Itching    Patient states she is allergic to all generic medications. Can only have name brand medicines    Current Outpatient Prescriptions  Medication Sig Dispense Refill  . albuterol (PROVENTIL HFA;VENTOLIN HFA) 108 (90 BASE) MCG/ACT inhaler Inhale 2 puffs into the lungs every 6 (six) hours as needed for shortness of breath. For asthma  6.7 g  1  . aspirin EC 81 MG EC tablet Take 1 tablet (81 mg total) by mouth daily.      . naproxen sodium (ANAPROX) 220 MG tablet Take 440 mg by mouth daily as needed. For pain      . HYDROcodone-acetaminophen (NORCO/VICODIN) 5-325 MG per tablet Take 2 tablets by mouth every 4 (four) hours as needed for pain.  6 tablet  0  . metoprolol succinate (TOPROL XL) 50 MG 24 hr tablet Take 1 tablet (50 mg total) by mouth daily. Take with or immediately following a meal.  30 tablet  6   No current facility-administered medications for this visit.    Past Medical History  Diagnosis Date  . Asthma     . Tachycardia march 2014    admitted to hospital  . Chest pain march 2014    admitted to hospital   . Hypertension   . Obesity     No past surgical history on file.  No family history on file.  History   Social History  . Marital Status: Divorced    Spouse Name: N/A    Number of Children: N/A  . Years of Education: N/A   Occupational History  . Not on file.   Social History Main Topics  . Smoking status: Current Every Day Smoker -- 0.50 packs/day for 15 years    Types: Cigarettes  . Smokeless tobacco: Not on file  . Alcohol Use: Yes  . Drug Use: Not on file  . Sexual Activity: Yes   Other Topics Concern  . Not on file   Social History Narrative  . No narrative on file    Review of systems: The patient specifically denies any chest pain at rest or with exertion, dyspnea at rest, orthopnea, paroxysmal nocturnal dyspnea, syncope, focal neurological deficits, intermittent claudication, lower extremity edema, unexplained weight gain, cough, hemoptysis or wheezing.  The patient also denies abdominal pain, nausea, vomiting, dysphagia, diarrhea, constipation, polyuria, polydipsia, dysuria, hematuria, frequency, urgency, abnormal bleeding or bruising, fever, chills, unexpected weight changes, mood swings, change in skin or hair texture, change in voice quality, auditory or visual  problems, allergic reactions or rashes, new musculoskeletal complaints other than usual "aches and pains".   PHYSICAL EXAM BP 130/80  Pulse 90  Resp 16  Ht 5\' 5"  (1.651 m)  Wt 120.022 kg (264 lb 9.6 oz)  BMI 44.03 kg/m2  General: Alert, oriented x3, no distress. Morbid obesity limits her examination Head: no evidence of trauma, PERRL, EOMI, no exophtalmos or lid lag, no myxedema, no xanthelasma; normal ears, nose and oropharynx Neck: normal jugular venous pulsations and no hepatojugular reflux; brisk carotid pulses without delay and no carotid bruits Chest: clear to auscultation, no signs of  consolidation by percussion or palpation, normal fremitus, symmetrical and full respiratory excursions Cardiovascular: normal position and quality of the apical impulse, regular rhythm, normal first and second heart sounds, no murmurs, rubs or gallops Abdomen: no tenderness or distention, no masses by palpation, no abnormal pulsatility or arterial bruits, normal bowel sounds, no hepatosplenomegaly Extremities: no clubbing, cyanosis or edema; 2+ radial, ulnar and brachial pulses bilaterally; 2+ right femoral, posterior tibial and dorsalis pedis pulses; 2+ left femoral, posterior tibial and dorsalis pedis pulses; no subclavian or femoral bruits Neurological: grossly nonfocal   EKG: NSR  BMET    Component Value Date/Time   NA 139 10/08/2012 0128   K 3.5 10/08/2012 0128   CL 102 10/08/2012 0128   CO2 24 10/08/2012 0128   GLUCOSE 116* 10/08/2012 0128   BUN 11 10/08/2012 0128   CREATININE 1.04 10/08/2012 0128   CALCIUM 9.3 10/08/2012 0128   GFRNONAA 63* 10/08/2012 0128   GFRAA 73* 10/08/2012 0128     ASSESSMENT AND PLAN  Rebecca Malone appears to have frequent but isolated palpitations in the setting of a structurally normal heart. It is possible that her symptoms are related to asthma and bronchodilator use. Since the symptoms are occurring daily which should catch them on a 48-hour Holter monitor. Have asked her to keep a detailed log with precise times of her symptoms. She might do better with a low dose of beta blocker, but we'll have to watch to make sure this does not interfere with her reactive airway disease.  I reassured her that in the absence of serious structural abnormalities it is unlikely that her arrhythmia is life-threatening or serious.  Patient Instructions  STOP Diltiazem.  START Toprol XL 50mg  once a day.  Your physician has recommended that you wear a holter monitor for 48 hours. Holter monitors are medical devices that record the heart's electrical activity. Doctors most often  use these monitors to diagnose arrhythmias. Arrhythmias are problems with the speed or rhythm of the heartbeat. The monitor is a small, portable device. You can wear one while you do your normal daily activities. This is usually used to diagnose what is causing palpitations/syncope (passing out).   Your physician recommends that you schedule a follow-up appointment in: ONE MONTH.       Orders Placed This Encounter  Procedures  . EKG 12-Lead  . Holter monitor - 24 hour   Meds ordered this encounter  Medications  . metoprolol succinate (TOPROL XL) 50 MG 24 hr tablet    Sig: Take 1 tablet (50 mg total) by mouth daily. Take with or immediately following a meal.    Dispense:  30 tablet    Refill:  Ashton Adaiah Morken, MD, [facility] [phone] office [phone] pager

## 2013-05-22 ENCOUNTER — Ambulatory Visit: Payer: Medicaid Other | Admitting: Cardiovascular Disease

## 2013-06-09 ENCOUNTER — Ambulatory Visit (INDEPENDENT_AMBULATORY_CARE_PROVIDER_SITE_OTHER): Payer: Medicaid Other | Admitting: Cardiovascular Disease

## 2013-06-09 ENCOUNTER — Encounter: Payer: Self-pay | Admitting: Cardiovascular Disease

## 2013-06-09 VITALS — BP 178/78 | HR 63 | Ht 65.0 in | Wt 265.9 lb

## 2013-06-09 DIAGNOSIS — R002 Palpitations: Secondary | ICD-10-CM

## 2013-06-09 DIAGNOSIS — F17200 Nicotine dependence, unspecified, uncomplicated: Secondary | ICD-10-CM

## 2013-06-09 DIAGNOSIS — I10 Essential (primary) hypertension: Secondary | ICD-10-CM

## 2013-06-09 MED ORDER — BLOOD PRESSURE MONITOR AUTOMAT DEVI
1.0000 mL | Freq: Every day | Status: DC
Start: 2013-06-09 — End: 2013-06-12

## 2013-06-09 NOTE — Assessment & Plan Note (Signed)
Her blood pressure is acceptably high today. Chest not had an opportunity to check it at home. She is very careful with sodium intake. Will have her monitor blood pressure at home and if it is consistently elevated would probably recommend adding amlodipine or a low dose of a diuretic to her current beta blocker prescription

## 2013-06-09 NOTE — Patient Instructions (Signed)
A prescription has been sent to your pharmacy for a blood pressure monitor.  Dr. Sallyanne Kuster recommends that you schedule a follow-up appointment in: 6 months.

## 2013-06-09 NOTE — Assessment & Plan Note (Signed)
Seems much better symptomatically on metoprolol rather than diltiazem. No worsening of wheezing or breathing. Would not push our luck: would avoid higher doses of beta blocker.

## 2013-06-09 NOTE — Assessment & Plan Note (Signed)
Complete cessation strongly recommended.

## 2013-06-09 NOTE — Assessment & Plan Note (Signed)
Significant weight loss is an important long-term goal

## 2013-06-09 NOTE — Progress Notes (Signed)
Patient ID: Rebecca Malone, female   DOB: Oct 24, 1965, 48 y.o.   MRN: [id number]      Reason for office visit Palpitations, hypertension  Rebecca Malone is a 48 year old obese woman with hypertension but without no structural heart disease that has been troubled by palpitations. Holter monitoring has shown these to be related to isolated PACs and PVCs. She uses bronchodilators for asthma. Unfortunately continues to smoke. Diltiazem did not provide sufficient relief but after switching to metoprolol she feels much better. She has not had any worsening wheezing or dyspnea while on this medication. Her blood pressure is high today but this is unusual for her. She takes nonsteroidal anti-inflammatory drugs 2-3 times a week for knee pain.   Allergies  Allergen Reactions  . Advair Diskus [Fluticasone-Salmeterol] Other (See Comments)    Made sores on the back of her throat  . Other Hives and Itching    Patient states she is allergic to all generic medications. Can only have name brand medicines    Current Outpatient Prescriptions  Medication Sig Dispense Refill  . albuterol (PROVENTIL HFA;VENTOLIN HFA) 108 (90 BASE) MCG/ACT inhaler Inhale 2 puffs into the lungs every 6 (six) hours as needed for shortness of breath. For asthma  6.7 g  1  . aspirin EC 81 MG EC tablet Take 1 tablet (81 mg total) by mouth daily.      . metoprolol succinate (TOPROL XL) 50 MG 24 hr tablet Take 1 tablet (50 mg total) by mouth daily. Take with or immediately following a meal.  30 tablet  6  . naproxen sodium (ANAPROX) 220 MG tablet Take 440 mg by mouth daily as needed. For pain      . Blood Pressure Monitoring (BLOOD PRESSURE MONITOR AUTOMAT) DEVI 1 mL by Does not apply route daily.  1 Device  0  . HYDROcodone-acetaminophen (NORCO/VICODIN) 5-325 MG per tablet Take 2 tablets by mouth every 4 (four) hours as needed for pain.  6 tablet  0   No current facility-administered medications for this visit.    Past Medical  History  Diagnosis Date  . Asthma   . Tachycardia march 2014    admitted to hospital  . Chest pain march 2014    admitted to hospital   . Hypertension   . Obesity     No past surgical history on file.  No family history on file.  History   Social History  . Marital Status: Divorced    Spouse Name: N/A    Number of Children: N/A  . Years of Education: N/A   Occupational History  . Not on file.   Social History Main Topics  . Smoking status: Current Every Day Smoker -- 0.50 packs/day for 15 years    Types: Cigarettes  . Smokeless tobacco: Not on file  . Alcohol Use: Yes  . Drug Use: Not on file  . Sexual Activity: Yes   Other Topics Concern  . Not on file   Social History Narrative  . No narrative on file    Review of systems: The patient specifically denies any chest pain at rest or with exertion, dyspnea at rest, orthopnea, paroxysmal nocturnal dyspnea, syncope, focal neurological deficits, intermittent claudication, lower extremity edema, unexplained weight gain, cough, hemoptysis or wheezing.  The patient also denies abdominal pain, nausea, vomiting, dysphagia, diarrhea, constipation, polyuria, polydipsia, dysuria, hematuria, frequency, urgency, abnormal bleeding or bruising, fever, chills, unexpected weight changes, mood swings, change in skin or hair texture, change in voice quality,  auditory or visual problems, allergic reactions or rashes, new musculoskeletal complaints other than usual "aches and pains".   PHYSICAL EXAM BP 178/78  Pulse 63  Ht 5\' 5"  (1.651 m)  Wt 265 lb 14.4 oz (120.611 kg)  BMI 44.25 kg/m2  General: Alert, oriented x3, no distress, obese Head: no evidence of trauma, PERRL, EOMI, no exophtalmos or lid lag, no myxedema, no xanthelasma; normal ears, nose and oropharynx Neck: normal jugular venous pulsations and no hepatojugular reflux; brisk carotid pulses without delay and no carotid bruits Chest: clear to auscultation, no signs of  consolidation by percussion or palpation, normal fremitus, symmetrical and full respiratory excursions Cardiovascular: Unable to locate the apical impulse, regular rhythm, normal first and second heart sounds, no murmurs, rubs or gallops Abdomen: no tenderness or distention, no masses by palpation, no abnormal pulsatility or arterial bruits, normal bowel sounds, no hepatosplenomegaly Extremities: no clubbing, cyanosis or edema; 2+ radial, ulnar and brachial pulses bilaterally; 2+ right femoral, posterior tibial and dorsalis pedis pulses; 2+ left femoral, posterior tibial and dorsalis pedis pulses; no subclavian or femoral bruits Neurological: grossly nonfocal   Lipid Panel  No results found for this basename: chol, trig, hdl, cholhdl, vldl, ldlcalc    BMET    Component Value Date/Time   NA 139 10/08/2012 0128   K 3.5 10/08/2012 0128   CL 102 10/08/2012 0128   CO2 24 10/08/2012 0128   GLUCOSE 116* 10/08/2012 0128   BUN 11 10/08/2012 0128   CREATININE 1.04 10/08/2012 0128   CALCIUM 9.3 10/08/2012 0128   GFRNONAA 63* 10/08/2012 0128   GFRAA 73* 10/08/2012 0128     ASSESSMENT AND PLAN HTN (hypertension) Her blood pressure is acceptably high today. Chest not had an opportunity to check it at home. She is very careful with sodium intake. Will have her monitor blood pressure at home and if it is consistently elevated would probably recommend adding amlodipine or a low dose of a diuretic to her current beta blocker prescription  Palpitations Seems much better symptomatically on metoprolol rather than diltiazem. No worsening of wheezing or breathing. Would not push our luck: would avoid higher doses of beta blocker.  Smoker Complete cessation strongly recommended.  OBESITY, MORBID Significant weight loss is an important long-term goal  No orders of the defined types were placed in this encounter.   Meds ordered this encounter  Medications  . Blood Pressure Monitoring (BLOOD PRESSURE MONITOR  AUTOMAT) DEVI    Sig: 1 mL by Does not apply route daily.    Dispense:  1 Device    Refill:  0    Ashdon Gillson  Sanda Klein, MD, [facility] [phone] office [phone] pager

## 2013-06-12 ENCOUNTER — Telehealth: Payer: Self-pay | Admitting: Cardiovascular Disease

## 2013-06-12 MED ORDER — BLOOD PRESSURE MONITOR AUTOMAT DEVI: 1.0000 mL | Freq: Every day | Status: DC

## 2013-06-12 NOTE — Telephone Encounter (Signed)
[facility] can fill the blood pressure kit. Would you please send it to [facility]-Please fax [phone].

## 2013-06-12 NOTE — Telephone Encounter (Signed)
Blood pressure monitor order sent to [facility] at patient's request.

## 2013-10-17 ENCOUNTER — Telehealth: Payer: Self-pay | Admitting: Cardiovascular Disease

## 2013-10-17 NOTE — Telephone Encounter (Signed)
Closed encounter °

## 2013-11-15 DIAGNOSIS — R079 Chest pain, unspecified: Secondary | ICD-10-CM | POA: Diagnosis present

## 2013-11-15 DIAGNOSIS — M1990 Unspecified osteoarthritis, unspecified site: Secondary | ICD-10-CM | POA: Insufficient documentation

## 2013-11-15 DIAGNOSIS — Z79899 Other long term (current) drug therapy: Secondary | ICD-10-CM | POA: Diagnosis not present

## 2013-11-15 DIAGNOSIS — R1011 Right upper quadrant pain: Secondary | ICD-10-CM | POA: Insufficient documentation

## 2013-11-15 DIAGNOSIS — Z7982 Long term (current) use of aspirin: Secondary | ICD-10-CM | POA: Diagnosis not present

## 2013-11-15 DIAGNOSIS — J45909 Unspecified asthma, uncomplicated: Secondary | ICD-10-CM | POA: Diagnosis not present

## 2013-11-15 DIAGNOSIS — I10 Essential (primary) hypertension: Secondary | ICD-10-CM | POA: Insufficient documentation

## 2013-11-15 DIAGNOSIS — R112 Nausea with vomiting, unspecified: Secondary | ICD-10-CM | POA: Diagnosis not present

## 2013-11-15 DIAGNOSIS — K210 Gastro-esophageal reflux disease with esophagitis: Secondary | ICD-10-CM | POA: Insufficient documentation

## 2013-11-15 DIAGNOSIS — Z720 Tobacco use: Secondary | ICD-10-CM | POA: Diagnosis not present

## 2013-11-15 DIAGNOSIS — I252 Old myocardial infarction: Secondary | ICD-10-CM | POA: Insufficient documentation

## 2013-11-15 DIAGNOSIS — E669 Obesity, unspecified: Secondary | ICD-10-CM | POA: Diagnosis not present

## 2013-11-15 DIAGNOSIS — R1013 Epigastric pain: Secondary | ICD-10-CM | POA: Diagnosis not present

## 2013-11-16 ENCOUNTER — Emergency Department (HOSPITAL_COMMUNITY): Payer: Medicaid Other

## 2013-11-16 ENCOUNTER — Encounter (HOSPITAL_COMMUNITY): Payer: Self-pay | Admitting: Emergency Medicine

## 2013-11-16 ENCOUNTER — Emergency Department (HOSPITAL_COMMUNITY)
Admission: EM | Admit: 2013-11-16 | Discharge: 2013-11-16 | Disposition: A | Discharge status: Home / Self Care | Payer: Medicaid Other | Attending: Emergency Medicine | Admitting: Emergency Medicine

## 2013-11-16 DIAGNOSIS — R1011 Right upper quadrant pain: Secondary | ICD-10-CM

## 2013-11-16 DIAGNOSIS — R1013 Epigastric pain: Secondary | ICD-10-CM

## 2013-11-16 DIAGNOSIS — K2100 Gastro-esophageal reflux disease with esophagitis, without bleeding: Secondary | ICD-10-CM

## 2013-11-16 HISTORY — DX: Acute myocardial infarction, unspecified: I21.9

## 2013-11-16 HISTORY — DX: Unspecified osteoarthritis, unspecified site: M19.90

## 2013-11-16 LAB — BASIC METABOLIC PANEL
Anion gap: 11 (ref 5–15)
BUN: 18 mg/dL (ref 6–23)
CALCIUM: 9.4 mg/dL (ref 8.4–10.5)
CHLORIDE: 106 meq/L (ref 96–112)
CO2: 23 meq/L (ref 19–32)
Creatinine, Ser: 0.98 mg/dL (ref 0.50–1.10)
GFR calc Af Amer: 78 mL/min — ABNORMAL LOW (ref >90)
GFR calc non Af Amer: 67 mL/min — ABNORMAL LOW (ref >90)
GLUCOSE: 142 mg/dL — AB (ref 70–99)
Potassium: 3.8 mEq/L (ref 3.7–5.3)
SODIUM: 140 meq/L (ref 137–147)

## 2013-11-16 LAB — HEPATIC FUNCTION PANEL
ALBUMIN: 3.6 g/dL (ref 3.5–5.2)
ALT: 18 U/L (ref 0–35)
AST: 15 U/L (ref 0–37)
Alkaline Phosphatase: 111 U/L (ref 39–117)
Bilirubin, Direct: <0.2 mg/dL (ref 0.0–0.3)
TOTAL PROTEIN: 7.9 g/dL (ref 6.0–8.3)
Total Bilirubin: 0.2 mg/dL — ABNORMAL LOW (ref 0.3–1.2)

## 2013-11-16 LAB — CBC
HCT: 39.9 % (ref 36.0–46.0)
HEMOGLOBIN: 12.9 g/dL (ref 12.0–15.0)
MCH: 27.3 pg (ref 26.0–34.0)
MCHC: 32.3 g/dL (ref 30.0–36.0)
MCV: 84.4 fL (ref 78.0–100.0)
Platelets: 234 10*3/uL (ref 150–400)
RBC: 4.73 MIL/uL (ref 3.87–5.11)
RDW: 14.3 % (ref 11.5–15.5)
WBC: 5.1 10*3/uL (ref 4.0–10.5)

## 2013-11-16 LAB — LIPASE, BLOOD: LIPASE: 33 U/L (ref 11–59)

## 2013-11-16 LAB — I-STAT TROPONIN, ED
TROPONIN I, POC: 0 ng/mL (ref 0.00–0.08)
TROPONIN I, POC: 0 ng/mL (ref 0.00–0.08)

## 2013-11-16 LAB — PRO B NATRIURETIC PEPTIDE: PRO B NATRI PEPTIDE: 108.6 pg/mL (ref 0–125)

## 2013-11-16 MED ORDER — SUCRALFATE 1 G PO TABS
1.0000 g | ORAL_TABLET | Freq: Once | ORAL | Status: AC
Start: 2013-11-16 — End: 2013-11-16
  Administered 2013-11-16: 1 g via ORAL
  Filled 2013-11-16: qty 1

## 2013-11-16 MED ORDER — ONDANSETRON HCL 4 MG/2ML IJ SOLN
4.0000 mg | Freq: Once | INTRAMUSCULAR | Status: AC
  Administered 2013-11-16: 4 mg via INTRAVENOUS
  Filled 2013-11-16: qty 2

## 2013-11-16 MED ORDER — HYDROCODONE-ACETAMINOPHEN 5-325 MG PO TABS: 1.0000 | ORAL_TABLET | ORAL | Status: DC | PRN

## 2013-11-16 MED ORDER — MORPHINE SULFATE 4 MG/ML IJ SOLN
4.0000 mg | INTRAMUSCULAR | Status: DC | PRN
  Administered 2013-11-16 (×2): 4 mg via INTRAVENOUS
  Filled 2013-11-16 (×2): qty 1

## 2013-11-16 MED ORDER — OMEPRAZOLE 20 MG PO CPDR: 20.0000 mg | DELAYED_RELEASE_CAPSULE | Freq: Two times a day (BID) | ORAL | Status: DC

## 2013-11-16 MED ORDER — SUCRALFATE 1 G PO TABS: 1.0000 g | ORAL_TABLET | Freq: Four times a day (QID) | ORAL | Status: DC

## 2013-11-16 MED ORDER — ONDANSETRON 4 MG PO TBDP: 4.0000 mg | ORAL_TABLET | Freq: Three times a day (TID) | ORAL | Status: DC | PRN

## 2013-11-16 MED ORDER — MORPHINE SULFATE 4 MG/ML IJ SOLN
4.0000 mg | INTRAMUSCULAR | Status: DC | PRN
  Administered 2013-11-16: 4 mg via INTRAVENOUS
  Filled 2013-11-16: qty 1

## 2013-11-16 NOTE — ED Notes (Signed)
Re-attempted to call ultrasound with no success.

## 2013-11-16 NOTE — ED Notes (Signed)
Dr. James at the bedside.  

## 2013-11-16 NOTE — Discharge Instructions (Signed)
Abdominal Pain, Women °Abdominal (stomach, pelvic, or belly) pain can be caused by many things. It is important to tell your doctor: °· The location of the pain. °· Does it come and go or is it present all the time? °· Are there things that start the pain (eating certain foods, exercise)? °· Are there other symptoms associated with the pain (fever, nausea, vomiting, diarrhea)? °All of this is helpful to know when trying to find the cause of the pain. °CAUSES  °· Stomach: virus or bacteria infection, or ulcer. °· Intestine: appendicitis (inflamed appendix), regional ileitis (Crohn's disease), ulcerative colitis (inflamed colon), irritable bowel syndrome, diverticulitis (inflamed diverticulum of the colon), or cancer of the stomach or intestine. °· Gallbladder disease or stones in the gallbladder. °· Kidney disease, kidney stones, or infection. °· Pancreas infection or cancer. °· Fibromyalgia (pain disorder). °· Diseases of the female organs: °¨ Uterus: fibroid (non-cancerous) tumors or infection. °¨ Fallopian tubes: infection or tubal pregnancy. °¨ Ovary: cysts or tumors. °¨ Pelvic adhesions (scar tissue). °¨ Endometriosis (uterus lining tissue growing in the pelvis and on the pelvic organs). °¨ Pelvic congestion syndrome (female organs filling up with blood just before the menstrual period). °¨ Pain with the menstrual period. °¨ Pain with ovulation (producing an egg). °¨ Pain with an IUD (intrauterine device, birth control) in the uterus. °¨ Cancer of the female organs. °· Functional pain (pain not caused by a disease, may improve without treatment). °· Psychological pain. °· Depression. °DIAGNOSIS  °Your doctor will decide the seriousness of your pain by doing an examination. °· Blood tests. °· X-rays. °· Ultrasound. °· CT scan (computed tomography, special type of X-ray). °· MRI (magnetic resonance imaging). °· Cultures, for infection. °· Barium enema (dye inserted in the large intestine, to better view it with  X-rays). °· Colonoscopy (looking in intestine with a lighted tube). °· Laparoscopy (minor surgery, looking in abdomen with a lighted tube). °· Major abdominal exploratory surgery (looking in abdomen with a large incision). °TREATMENT  °The treatment will depend on the cause of the pain.  °· Many cases can be observed and treated at home. °· Over-the-counter medicines recommended by your caregiver. °· Prescription medicine. °· Antibiotics, for infection. °· Birth control pills, for painful periods or for ovulation pain. °· Hormone treatment, for endometriosis. °· Nerve blocking injections. °· Physical therapy. °· Antidepressants. °· Counseling with a psychologist or psychiatrist. °· Minor or major surgery. °HOME CARE INSTRUCTIONS  °· Do not take laxatives, unless directed by your caregiver. °· Take over-the-counter pain medicine only if ordered by your caregiver. Do not take aspirin because it can cause an upset stomach or bleeding. °· Try a clear liquid diet (broth or water) as ordered by your caregiver. Slowly move to a bland diet, as tolerated, if the pain is related to the stomach or intestine. °· Have a thermometer and take your temperature several times a day, and record it. °· Bed rest and sleep, if it helps the pain. °· Avoid sexual intercourse, if it causes pain. °· Avoid stressful situations. °· Keep your follow-up appointments and tests, as your caregiver orders. °· If the pain does not go away with medicine or surgery, you may try: °¨ Acupuncture. °¨ Relaxation exercises (yoga, meditation). °¨ Group therapy. °¨ Counseling. °SEEK MEDICAL CARE IF:  °· You notice certain foods cause stomach pain. °· Your home care treatment is not helping your pain. °· You need stronger pain medicine. °· You want your IUD removed. °· You feel faint or   lightheaded. °· You develop nausea and vomiting. °· You develop a rash. °· You are having side effects or an allergy to your medicine. °SEEK IMMEDIATE MEDICAL CARE IF:  °· Your  pain does not go away or gets worse. °· You have a fever. °· Your pain is felt only in portions of the abdomen. The right side could possibly be appendicitis. The left lower portion of the abdomen could be colitis or diverticulitis. °· You are passing blood in your stools (bright red or black tarry stools, with or without vomiting). °· You have blood in your urine. °· You develop chills, with or without a fever. °· You pass out. °MAKE SURE YOU:  °· Understand these instructions. °· Will watch your condition. °· Will get help right away if you are not doing well or get worse. °Document Released: 11/09/2006 Document Revised: 05/29/2013 Document Reviewed: 11/29/2008 °[employer]® Patient Information ©2015 [employer]. This information is not intended to replace advice given to you by your health care provider. Make sure you discuss any questions you have with your health care provider. ° °

## 2013-11-16 NOTE — ED Notes (Signed)
Portable chest x-ray

## 2013-11-16 NOTE — ED Provider Notes (Signed)
CSN: [id number]     Arrival date & time 11/15/13  2349 History   First MD Initiated Contact with Patient 11/16/13 0109     Chief Complaint  Patient presents with  . Chest Pain  . Emesis      HPI  Patient presents evaluation of epigastric and chest pain. She's had some macaroni and cheese and was sitting watching television about an hour and half later. She felt some abdominal pain and cramping. And felt a sudden burning pain in her substernal chest. States she started feeling "spasms of pain" in her upper abdomen. Vomited several times. Felt hot and sweaty and dizzy and lightheaded. This resolved, however pain in her epigastrium is persisted and she presents here.  Smokes. Does not drink. Does not take caffeine. Uses Aleve daily for right hip arthritis. No history of gallbladder disease. History of palpitations. No history of coronary artery disease. Normal nuclear medicine scan 2014.  Pancreas include hypertension diabetes tobacco use.  Past Medical History  Diagnosis Date  . Asthma   . Tachycardia march 2014    admitted to hospital  . Chest pain march 2014    admitted to hospital   . Hypertension   . Obesity   . Degenerative arthritis   . MI (myocardial infarction) March 2014   History reviewed. No pertinent past surgical history. History reviewed. No pertinent family history. History  Substance Use Topics  . Smoking status: Current Every Day Smoker -- 0.50 packs/day for 15 years    Types: Cigarettes  . Smokeless tobacco: Not on file  . Alcohol Use: Yes   OB History   Grav Para Term Preterm Abortions TAB SAB Ect Mult Living                 Review of Systems  Constitutional: Negative for fever, chills, diaphoresis, appetite change and fatigue.  HENT: Negative for mouth sores, sore throat and trouble swallowing.   Eyes: Negative for visual disturbance.  Respiratory: Negative for cough, chest tightness, shortness of breath and wheezing.   Cardiovascular: Positive for  chest pain.  Gastrointestinal: Positive for nausea, vomiting and abdominal pain. Negative for diarrhea and abdominal distention.  Endocrine: Negative for polydipsia, polyphagia and polyuria.  Genitourinary: Negative for dysuria, frequency and hematuria.  Musculoskeletal: Negative for gait problem.  Skin: Negative for color change, pallor and rash.  Neurological: Negative for dizziness, syncope, light-headedness and headaches.  Hematological: Does not bruise/bleed easily.  Psychiatric/Behavioral: Negative for behavioral problems and confusion.      Allergies  Advair diskus; Other; and Pork-derived products  Home Medications   Prior to Admission medications   Medication Sig Start Date End Date Taking? Authorizing Provider  albuterol (PROVENTIL HFA;VENTOLIN HFA) 108 (90 BASE) MCG/ACT inhaler Inhale 2 puffs into the lungs every 6 (six) hours as needed for shortness of breath. For asthma 04/09/12  Yes Sorin June Leap, MD  aspirin EC 81 MG EC tablet Take 1 tablet (81 mg total) by mouth daily. 04/09/12  Yes Sorin June Leap, MD  HYDROcodone-acetaminophen (NORCO/VICODIN) 5-325 MG per tablet Take 2 tablets by mouth every 4 (four) hours as needed for pain. 10/08/12  Yes Elmer Sow, MD  metFORMIN (GLUCOPHAGE) 500 MG tablet Take 500 mg by mouth 2 (two) times daily with a meal.   Yes Historical Provider, MD  metoprolol succinate (TOPROL XL) 50 MG 24 hr tablet Take 1 tablet (50 mg total) by mouth daily. Take with or immediately following a meal. 04/20/13  Yes Sanda Klein, MD  naproxen sodium (ANAPROX) 220 MG tablet Take 440 mg by mouth daily as needed. For pain   Yes Historical Provider, MD  Blood Pressure Monitoring (BLOOD PRESSURE MONITOR AUTOMAT) DEVI 1 mL by Does not apply route daily. 06/12/13   Mihai Croitoru, MD  HYDROcodone-acetaminophen (NORCO/VICODIN) 5-325 MG per tablet Take 1 tablet by mouth every 4 (four) hours as needed. 11/16/13   Tanna Furry, MD  omeprazole (PRILOSEC) 20 MG capsule Take 1  capsule (20 mg total) by mouth 2 (two) times daily. 11/16/13   Tanna Furry, MD  ondansetron (ZOFRAN ODT) 4 MG disintegrating tablet Take 1 tablet (4 mg total) by mouth every 8 (eight) hours as needed for nausea. 11/16/13   Tanna Furry, MD  sucralfate (CARAFATE) 1 G tablet Take 1 tablet (1 g total) by mouth 4 (four) times daily. 11/16/13   Tanna Furry, MD   BP 150/69  Pulse 55  Temp(Src) 99.1 F (37.3 C) (Oral)  Resp 18  Ht 5\' 4"  (1.626 m)  Wt 267 lb (121.11 kg)  BMI 45.81 kg/m2  SpO2 99% Physical Exam  Constitutional: She is oriented to person, place, and time. She appears well-developed and well-nourished. No distress.  HENT:  Head: Normocephalic.  Eyes: Conjunctivae are normal. Pupils are equal, round, and reactive to light. No scleral icterus.  Neck: Normal range of motion. Neck supple. No thyromegaly present.  Cardiovascular: Normal rate and regular rhythm.  Exam reveals no gallop and no friction rub.   No murmur heard. Pulmonary/Chest: Effort normal and breath sounds normal. No respiratory distress. She has no wheezes. She has no rales.  Abdominal: Soft. Bowel sounds are normal. She exhibits no distension. There is tenderness in the epigastric area. There is no rebound.    Musculoskeletal: Normal range of motion.  Neurological: She is alert and oriented to person, place, and time.  Skin: Skin is warm and dry. No rash noted.  Psychiatric: She has a normal mood and affect. Her behavior is normal.    ED Course  Procedures (including critical care time) Labs Review Labs Reviewed  BASIC METABOLIC PANEL - Abnormal; Notable for the following:    Glucose, Bld 142 (*)    GFR calc non Af Amer 67 (*)    GFR calc Af Amer 78 (*)    All other components within normal limits  HEPATIC FUNCTION PANEL - Abnormal; Notable for the following:    Total Bilirubin 0.2 (*)    All other components within normal limits  CBC  PRO B NATRIURETIC PEPTIDE  LIPASE, BLOOD  I-STAT TROPOININ, ED  Randolm Idol, ED    Imaging Review Dg Chest Port 1 View  11/16/2013   CLINICAL DATA:  Chest pain and epigastric pain.  EXAM: PORTABLE CHEST - 1 VIEW  COMPARISON:  10/08/2012  FINDINGS: Mild cardiac enlargement with normal pulmonary vascularity. No focal airspace disease or consolidation in the lungs. No blunting of costophrenic angles. No pneumothorax. Mediastinal contours appear intact.  IMPRESSION: Mild cardiac enlargement.  No evidence of active pulmonary disease.   Electronically Signed   By: Lucienne Capers M.D.   On: 11/16/2013 01:31   US Abdomen Limited Ruq  11/16/2013   CLINICAL DATA:  Abdominal pain.  EXAM: US ABDOMEN LIMITED - RIGHT UPPER QUADRANT  COMPARISON:  None.  FINDINGS: Gallbladder:  No gallstones or wall thickening visualized. No sonographic Murphy sign noted.  Common bile duct:  Diameter: 4.8 mm, normal  Liver:  Mild diffuse increased hepatic parenchymal echotexture suggesting mild fatty infiltration. No  focal lesions demonstrated.  IMPRESSION: No evidence of cholelithiasis or cholecystitis. Mild diffuse fatty infiltration of the liver.   Electronically Signed   By: Lucienne Capers M.D.   On: 11/16/2013 03:17     EKG Interpretation None      MDM   Final diagnoses:  Epigastric abdominal pain  RUQ pain  Gastroesophageal reflux disease with esophagitis    Carotid ultrasound negative. Troponins x2 negative. EKG without acute changes. Normal hepatobiliary and pancreatic enzymes. Symptoms are consistent with an episode of reflux with esophagitis and spasm. Likely vagal reaction. Minimally symptomatic now. Minimal tenderness and epigastric. Plan is proton pump inhibitor, Carafate, reflux precautions information. Primary care followup. Astra stop smoking. Stop her NSAID temporarily.    Tanna Furry, MD 11/16/13 [phone]

## 2013-11-16 NOTE — ED Notes (Signed)
Pt reports sharp left-sided chest pain that began yesterday evening approx 2100 - pt admits to shortness of breath, diaphoresis, lightheadedness, and n/v x4 episodes in the past hour.

## 2013-11-16 NOTE — ED Notes (Signed)
Attempted to call ultrasound at [phone] to check status, no response.

## 2013-12-08 ENCOUNTER — Telehealth: Payer: Self-pay | Admitting: Obstetrics

## 2013-12-12 NOTE — Telephone Encounter (Signed)
11.17.2015 - left patient a vm to please call and schedule np appt. brm

## 2013-12-15 NOTE — Telephone Encounter (Signed)
11.20.2015 - Left patient a VM to please call and schedule N appt. brm

## 2013-12-28 ENCOUNTER — Ambulatory Visit (INDEPENDENT_AMBULATORY_CARE_PROVIDER_SITE_OTHER): Payer: Medicaid Other | Admitting: Cardiovascular Disease

## 2013-12-28 ENCOUNTER — Encounter: Payer: Self-pay | Admitting: Cardiovascular Disease

## 2013-12-28 VITALS — BP 132/80 | HR 69 | Resp 16 | Ht 64.0 in | Wt 264.2 lb

## 2013-12-28 DIAGNOSIS — G4710 Hypersomnia, unspecified: Secondary | ICD-10-CM

## 2013-12-28 DIAGNOSIS — F17200 Nicotine dependence, unspecified, uncomplicated: Secondary | ICD-10-CM

## 2013-12-28 DIAGNOSIS — R5383 Other fatigue: Secondary | ICD-10-CM

## 2013-12-28 DIAGNOSIS — Z720 Tobacco use: Secondary | ICD-10-CM

## 2013-12-28 DIAGNOSIS — R0683 Snoring: Secondary | ICD-10-CM

## 2013-12-28 DIAGNOSIS — I10 Essential (primary) hypertension: Secondary | ICD-10-CM

## 2013-12-28 DIAGNOSIS — R079 Chest pain, unspecified: Secondary | ICD-10-CM

## 2013-12-28 MED ORDER — HYDROCODONE-ACETAMINOPHEN 5-325 MG PO TABS: 2.0000 | ORAL_TABLET | Freq: Four times a day (QID) | ORAL | Status: DC | PRN

## 2013-12-28 NOTE — Progress Notes (Signed)
Patient ID: Rebecca Malone, female   DOB: 05-06-1965, 48 y.o.   MRN: [id number]      Reason for office visit Palpitations, hypertension, chest pain  She was seen in the emergency room about a month ago with epigastric and chest discomfort not long following a meal. She vomited several times and felt diaphoretic and lightheaded. Her electrocardiogram at that time and again today shows minor nonspecific ST-T wave changes especially prominent in the inferior leads. They generally have the appearance of LVH associated repolarization abnormality, with voltage probably masked by obesity.  She tells me that her son has noticed her become unresponsive repeatedly. She'll be sitting in the couch and will "fade out" for 10-15 minutes. During this time he will talk to her and she will not respond. When she recovers she has no recollection of the events. She sleeps very poorly, but attributes this to working third shift to many years and also to worries about her health, especially after 2 of her close friends died unexpectedly from heart disease.  Rebecca Malone is a 48 year old obese woman with hypertension but without structural heart disease that has been troubled by palpitations. Holter monitoring has shown these to be related to isolated PACs and PVCs. She uses bronchodilators for asthma. Unfortunately continues to smoke. Diltiazem did not provide sufficient relief but after switching to metoprolol she feels much better. She has not had any worsening wheezing or dyspnea while on this medication.  She takes nonsteroidal anti-inflammatory drugs 2-3 times a week for knee pain. She had a normal nuclear stress test in March 2014.   Allergies  Allergen Reactions  . Advair Diskus [Fluticasone-Salmeterol] Other (See Comments)    Made sores on the back of her throat  . Other Hives and Itching    Patient states she is allergic to all generic medications. Can only have [place]  . Pork-Derived Products      Religious     Current Outpatient Prescriptions  Medication Sig Dispense Refill  . albuterol (PROVENTIL HFA;VENTOLIN HFA) 108 (90 BASE) MCG/ACT inhaler Inhale 2 puffs into the lungs every 6 (six) hours as needed for shortness of breath. For asthma 6.7 g 1  . aspirin EC 81 MG EC tablet Take 1 tablet (81 mg total) by mouth daily.    . Blood Pressure Monitoring (BLOOD PRESSURE MONITOR AUTOMAT) DEVI 1 mL by Does not apply route daily. 1 Device 0  . HYDROcodone-acetaminophen (NORCO/VICODIN) 5-325 MG per tablet Take 2 tablets by mouth every 4 (four) hours as needed for pain. 6 tablet 0  . HYDROcodone-acetaminophen (NORCO/VICODIN) 5-325 MG per tablet Take 1 tablet by mouth every 4 (four) hours as needed. 10 tablet 0  . metFORMIN (GLUCOPHAGE) 500 MG tablet Take 500 mg by mouth 2 (two) times daily with a meal.    . metoprolol succinate (TOPROL XL) 50 MG 24 hr tablet Take 1 tablet (50 mg total) by mouth daily. Take with or immediately following a meal. 30 tablet 6  . naproxen sodium (ANAPROX) 220 MG tablet Take 440 mg by mouth daily as needed. For pain    . omeprazole (PRILOSEC) 20 MG capsule Take 1 capsule (20 mg total) by mouth 2 (two) times daily. 60 capsule 1  . ondansetron (ZOFRAN ODT) 4 MG disintegrating tablet Take 1 tablet (4 mg total) by mouth every 8 (eight) hours as needed for nausea. 20 tablet 0  . sucralfate (CARAFATE) 1 G tablet Take 1 tablet (1 g total) by mouth 4 (four)  times daily. 60 tablet 0   No current facility-administered medications for this visit.    Past Medical History  Diagnosis Date  . Asthma   . Tachycardia march 2014    admitted to hospital  . Chest pain march 2014    admitted to hospital   . Hypertension   . Obesity   . Degenerative arthritis   . MI (myocardial infarction) March 2014    No past surgical history on file.  No family history on file.  History   Social History  . Marital Status: Divorced    Spouse Name: N/A    Number of Children: N/A    . Years of Education: N/A   Occupational History  . Not on file.   Social History Main Topics  . Smoking status: Current Every Day Smoker -- 0.50 packs/day for 15 years    Types: Cigarettes  . Smokeless tobacco: Not on file  . Alcohol Use: Yes  . Drug Use: Not on file  . Sexual Activity: Yes   Other Topics Concern  . Not on file   Social History Narrative    Review of systems: Described above  PHYSICAL EXAM BP 132/80 mmHg  Pulse 69  Resp 16  Ht 5\' 4"  (1.626 m)  Wt 264 lb 3.2 oz (119.84 kg)  BMI 45.33 kg/m2 General: Alert, oriented x3, no distress, obese Head: no evidence of trauma, PERRL, EOMI, no exophtalmos or lid lag, no myxedema, no xanthelasma; normal ears, nose and oropharynx Neck: normal jugular venous pulsations and no hepatojugular reflux; brisk carotid pulses without delay and no carotid bruits Chest: clear to auscultation, no signs of consolidation by percussion or palpation, normal fremitus, symmetrical and full respiratory excursions Cardiovascular: Unable to locate the apical impulse, regular rhythm, normal first and second heart sounds, no murmurs, rubs or gallops Abdomen: no tenderness or distention, no masses by palpation, no abnormal pulsatility or arterial bruits, normal bowel sounds, no hepatosplenomegaly Extremities: no clubbing, cyanosis or edema; 2+ radial, ulnar and brachial pulses bilaterally; 2+ right femoral, posterior tibial and dorsalis pedis pulses; 2+ left femoral, posterior tibial and dorsalis pedis pulses; no subclavian or femoral bruits Neurological: grossly nonfocal   EKG: Normal sinus rhythm, nonspecific T-wave abnormality, consider LVH mass by obesity.  Lipid Panel  No results found for: CHOL, TRIG, HDL, CHOLHDL, VLDL, LDLCALC, LDLDIRECT  BMET    Component Value Date/Time   NA 140 11/16/2013 0026   K 3.8 11/16/2013 0026   CL 106 11/16/2013 0026   CO2 23 11/16/2013 0026   GLUCOSE 142* 11/16/2013 0026   BUN 18 11/16/2013 0026    CREATININE 0.98 11/16/2013 0026   CALCIUM 9.4 11/16/2013 0026   GFRNONAA 67* 11/16/2013 0026   GFRAA 78* 11/16/2013 0026     ASSESSMENT AND PLAN  Possible obstructive sleep apnea Her episodes of "fading out" maybe daytime hypersomnia secondary to sleep apnea. She has the typical body habitus for this disorder. Recommend sleep study. If this is negative would refer to neurology since she could be having absence seizures.  HTN (hypertension) Her blood pressure is well controlled today.   Palpitations Seems much better symptomatically on metoprolol rather than diltiazem. No worsening of wheezing or breathing. Would not push our luck: would avoid higher doses of beta blocker.  Smoker Complete cessation strongly recommended.  OBESITY, MORBID Significant weight loss is an important long-term goal Orders Placed This Encounter  Procedures  . EKG 12-Lead   No orders of the defined types were placed in  this encounter.    Holli Humbles, MD, [place] [phone] office [phone] pager

## 2013-12-28 NOTE — Patient Instructions (Signed)
Your physician has recommended that you have a sleep study. This test records several body functions during sleep, including: brain activity, eye movement, oxygen and carbon dioxide blood levels, heart rate and rhythm, breathing rate and rhythm, the flow of air through your mouth and nose, snoring, body muscle movements, and chest and belly movement.  One refill for Hydrocodone as been given.  If you need additional refills please get these through your Primary Care Physician.  Dr. Sallyanne Kuster recommends that you schedule a follow-up appointment in: 6 months.

## 2014-01-02 ENCOUNTER — Other Ambulatory Visit: Payer: Self-pay | Admitting: *Deleted

## 2014-01-02 MED ORDER — METOPROLOL SUCCINATE ER 50 MG PO TB24: 50.0000 mg | ORAL_TABLET | Freq: Every day | ORAL | Status: DC

## 2014-01-02 NOTE — Telephone Encounter (Signed)
PA for brand Toprol XL 50mg  daily approved #[id number]. New Rx sent to pharmacy with this #.

## 2014-03-01 ENCOUNTER — Ambulatory Visit: Payer: Medicaid Other | Admitting: Obstetrics

## 2014-03-19 ENCOUNTER — Ambulatory Visit (HOSPITAL_BASED_OUTPATIENT_CLINIC_OR_DEPARTMENT_OTHER): Payer: Medicaid Other | Attending: Cardiovascular Disease

## 2014-03-19 VITALS — Ht 65.0 in | Wt 256.0 lb

## 2014-03-19 DIAGNOSIS — G4710 Hypersomnia, unspecified: Secondary | ICD-10-CM | POA: Diagnosis present

## 2014-03-19 DIAGNOSIS — G4733 Obstructive sleep apnea (adult) (pediatric): Secondary | ICD-10-CM

## 2014-03-19 DIAGNOSIS — R5383 Other fatigue: Secondary | ICD-10-CM | POA: Insufficient documentation

## 2014-03-19 DIAGNOSIS — R0683 Snoring: Secondary | ICD-10-CM | POA: Diagnosis not present

## 2014-03-28 ENCOUNTER — Ambulatory Visit (HOSPITAL_BASED_OUTPATIENT_CLINIC_OR_DEPARTMENT_OTHER): Payer: Medicaid Other | Admitting: Pulmonary Disease

## 2014-03-28 DIAGNOSIS — G4733 Obstructive sleep apnea (adult) (pediatric): Secondary | ICD-10-CM

## 2014-03-31 ENCOUNTER — Encounter: Payer: Self-pay | Admitting: Cardiovascular Disease

## 2014-03-31 NOTE — Addendum Note (Signed)
Addended by: Shelva Majestic A on: 03/31/2014 05:06 PM   Modules accepted: Level of Service

## 2014-03-31 NOTE — Sleep Study (Signed)
NAME: Rebecca Malone DATE OF BIRTH:  1965-08-28 MEDICAL RECORD NUMBER [id number]  LOCATION: [facility]  PHYSICIAN: KELLY,THOMAS A  DATE OF STUDY: 03/19/2014  SLEEP STUDY TYPE: Nocturnal Polysomnogram               REFERRING PHYSICIAN: Croitoru, Mihai, MD  INDICATION FOR STUDY:  [name] is a 49 year old African-American female who is referred for a sleep study to evaluate for sleep apnea.  She complains of apnea, snoring, waking up gasping for breath and poor sleep quality.  EPWORTH SLEEPINESS SCORE:  6 HEIGHT: 5\' 5"  (165.1 cm)  WEIGHT: 256 lb (116.121 kg)    Body mass index is 42.6 kg/(m^2).  NECK SIZE: 15 in.  MEDICATIONS:  Medications sucralfate (CARAFATE) 1 G tablet 1 g, 4 times daily ondansetron (ZOFRAN ODT) 4 MG disintegrating tablet 4 mg, Every 8 hours PRN omeprazole (PRILOSEC) 20 MG capsule 20 mg, 2 times daily naproxen sodium (ANAPROX) 220 MG tablet 440 mg, Daily PRN metoprolol succinate (TOPROL XL) 50 MG 24 hr tablet 50 mg, Daily metFORMIN (GLUCOPHAGE) 500 MG tablet 500 mg, 2 times daily with meals  Note: Patient reports it's "been a few weeks" (Written 11/16/2013 0151)  HYDROcodone-acetaminophen (NORCO/VICODIN) 5-325 MG per tablet 2 tablet, Every 6 hours PRN HYDROcodone-acetaminophen (NORCO/VICODIN) 5-325 MG per tablet 1 tablet, Every 4 hours PRN Blood Pressure Monitoring (BLOOD PRESSURE MONITOR AUTOMAT) DEVI 1 mL, Daily aspirin EC 81 MG EC tablet 81 mg, Daily albuterol (PROVENTIL HFA;VENTOLIN HFA) 108 (90 BASE) MCG/ACT inhaler 2 puff, Every 6 hours PRN   SLEEP ARCHITECTURE:  The patient slept for 79 minutes out of a sleep period of time of 242 minutes.  Sleep efficiency was very poor at only 21.7%.  Sleep latency was normal at 24.5 minutes.  Latency to REM sleep was 70 minutes.  The patient spent 39.5 minutes and (50%) in stage I, 19.5 minutes (24.7%) in stage II, 0 minutes in stage III, and 20 minutes (25.3% in stage REM.  She slept for 22.5 minutes  (28.5% in supine sleep.   During her entire sleep period, she  had 23 arousals with an index of 17.5 per hour.  RESPIRATORY DATA:  Throughout the entire sleep.  She had 0 obstructive apneas, 0 central apneas, 0 mixed apneas, and 17  Hypopneas. The apnea hypopnea index (AHI) was 12.9 per hour.  The respiratory disturbance index was 14.4 per hour.  During REM sleep, the AHI was 27.0 per hour.  This places the patient in the category of mild-to-moderate sleep apnea overall, but moderate sleep apnea during REM sleep.  There was mild to moderate snoring.  OXYGEN DATA:  The baseline oxygen saturation was 95%.  The oxygen nadir both during non-REM and REM sleep was 87%.  CARDIAC DATA:  The patient was in sinus rhythm.  The average heart rate was 68 bpm.  MOVEMENT/PARASOMNIA:  There were 0 periodic limb movements.  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  Events were worse during REM sleep. Significantly reduced sleep efficiency. Mild to moderate snoring. No evidence for nocturnal myoclonus. Arousal index was abnormal.  CPAP titration is recommended in light of the severity of this patient's sleep apnea/hypoxia syndrome and its symptomatic nature in this patient with cardiovascular comorbidities. The patient should be counseled in good sleep hygiene procedures as well as significant weight loss (BMI 43). Effort should be made to optimize nasal and oropharyngeal patency.    Troy Sine Diplomate, American Board of Sleep Medicine  ELECTRONICALLY SIGNED ON:  03/31/2014, 4:55 PM [place] PH: [phone]   FX: [phone] [place]

## 2014-04-06 ENCOUNTER — Telehealth: Payer: Self-pay | Admitting: *Deleted

## 2014-04-06 DIAGNOSIS — G4733 Obstructive sleep apnea (adult) (pediatric): Secondary | ICD-10-CM

## 2014-04-06 NOTE — Telephone Encounter (Signed)
Informed patient of sleep study results and recommendations. CPAP titration study ordered.

## 2014-04-09 ENCOUNTER — Encounter: Payer: Self-pay | Admitting: Cardiovascular Disease

## 2014-04-09 ENCOUNTER — Telehealth: Payer: Self-pay | Admitting: Cardiovascular Disease

## 2014-04-09 NOTE — Telephone Encounter (Signed)
Returned a call to patient. She wanted to know how she will get the CPAP machine once it is ordered. Explained to her that once she has her titration study and it is read all of her information will be sent to a MDE company. They will then verify her insurance benefits. Once this is done they will call her to schedule a set up appointment. Patient was instructed to call [facility] if she has not heard from them by the middle of this week.

## 2014-04-09 NOTE — Telephone Encounter (Signed)
She talked to you Friday,need to ask some question about her getting the C Pap machine please.

## 2014-04-10 DIAGNOSIS — M25561 Pain in right knee: Secondary | ICD-10-CM | POA: Insufficient documentation

## 2014-05-01 ENCOUNTER — Ambulatory Visit (HOSPITAL_BASED_OUTPATIENT_CLINIC_OR_DEPARTMENT_OTHER): Payer: Medicaid Other

## 2014-05-04 ENCOUNTER — Ambulatory Visit (HOSPITAL_BASED_OUTPATIENT_CLINIC_OR_DEPARTMENT_OTHER): Payer: Medicaid Other | Attending: Cardiovascular Disease

## 2014-05-04 VITALS — Ht 64.0 in | Wt 264.0 lb

## 2014-05-04 DIAGNOSIS — G4730 Sleep apnea, unspecified: Secondary | ICD-10-CM | POA: Diagnosis present

## 2014-05-11 ENCOUNTER — Encounter (HOSPITAL_BASED_OUTPATIENT_CLINIC_OR_DEPARTMENT_OTHER): Payer: Medicaid Other

## 2014-05-13 NOTE — Sleep Study (Signed)
   NAME: Rebecca Malone DATE OF BIRTH:  Feb 01, 1965 MEDICAL RECORD NUMBER [id number]  LOCATION: [facility]  PHYSICIAN: Vennie Salsbury A  DATE OF STUDY: 05/04/2014  SLEEP STUDY TYPE: Positive Airway Pressure Titration               REFERRING PHYSICIAN: Troy Sine, MD  INDICATION FOR STUDY:  Rebecca Malone is a 49 year old African American female who was found to have mild to moderate obstructive sleep apnea overall with an AHI of 12.9/hr and  moderately severe sleep apnea at 27 per hour during REM sleep.  She is referred for CPAP titration.  EPWORTH SLEEPINESS SCORE:  6 HEIGHT: 5\' 4"  (162.6 cm)  WEIGHT: 264 lb (119.75 kg)    Body mass index is 45.29 kg/(m^2).  NECK SIZE: 15 in.  MEDICATIONS:   sucralfate (CARAFATE) 1 G tablet 1 g, 4 times daily ondansetron (ZOFRAN ODT) 4 MG disintegrating tablet 4 mg, Every 8 hours PRN omeprazole (PRILOSEC) 20 MG capsule 20 mg, 2 times daily naproxen sodium (ANAPROX) 220 MG tablet 440 mg, Daily PRN metoprolol succinate (TOPROL XL) 50 MG 24 hr tablet 50 mg, Daily metFORMIN (GLUCOPHAGE) 500 MG tablet 500 mg, 2 times daily with meals  Note: Patient reports it's "been a few weeks" (Written 11/16/2013 0151)  HYDROcodone-acetaminophen (NORCO/VICODIN) 5-325 MG per tablet 2 tablet, Every 6 hours PRN HYDROcodone-acetaminophen (NORCO/VICODIN) 5-325 MG per tablet 1 tablet, Every 4 hours PRN Blood Pressure Monitoring (BLOOD PRESSURE MONITOR AUTOMAT) DEVI 1 mL, Daily aspirin EC 81 MG EC tablet 81 mg, Daily albuterol (PROVENTIL HFA;VENTOLIN HFA) 108 (90 BASE) MCG/ACT inhaler  SLEEP ARCHITECTURE:  Sleep efficiency was reduced with a total sleep time of 65.5 out of a total recording time of 135 minutes and sleep.  At time of 80 minutes.  Latency to sleep onset was 18 minutes.  She spent 16 minutes (24.4%) in stage I, and 49.5 minutes (75.6%) in stage II.  Sleep architecture was abnormal with the patient's inability to achieve slow-wave sleep or REM  sleep.  There were 2 arousals with an index of 1.8.  RESPIRATORY DATA:  CPAP titration was initiated at 5 cm water pressure and was titrated up to 7 cm water pressure.  The AHI was 0 at 7 cm water pressure.  No further titration was done due to the patient experiencing a panic attack and she declined to continue to study after this panic attack.  REM sleep was never achieved.  OXYGEN DATA:  The baseline oxygen saturations 92%.  The lowest  oxygen desaturation was 91% with non-REM sleep.  CARDIAC DATA:  The patient maintained sinus rhythm with average heart rate 72 bpm.  MOVEMENT/PARASOMNIA:  There were 0 periodic leg movements.  IMPRESSION/ RECOMMENDATION:   Previously documented mild to moderate obstructive sleep apnea overall with moderately severe obstructive sleep apnea during REM sleep.  The present CPAP titration trial was aborted after the patient experienced a panic attack at only 7 cm water pressure.  At this pressure, her AHI was excellent; however, sleep was not achieved.  Recommend initial use of CPAP auto with C-Flex/EPR of 3 at 5-20 c m water pressure with heated humidification.  If patient refuses CPAP therapy, consider referral for a customized oral dental appliance.  [place], American Board of Sleep Medicine  ELECTRONICALLY SIGNED ON:  05/13/2014, 10:55 AM [place] PH: [phone]   FX: [phone] [place]

## 2014-05-13 NOTE — Addendum Note (Signed)
Addended by: Shelva Majestic A on: 05/13/2014 11:06 AM   Modules accepted: Level of Service

## 2014-05-15 ENCOUNTER — Telehealth: Payer: Self-pay | Admitting: *Deleted

## 2014-05-15 NOTE — Telephone Encounter (Signed)
Informed patient of CPAP titration results. Referral will be done to MDE company for set up. Patient voiced her understanding of this.

## 2014-05-15 NOTE — Telephone Encounter (Signed)
Faxed order, referral and supporting documentation to [facility] for CPAP machine set up.

## 2014-05-17 ENCOUNTER — Encounter: Payer: Self-pay | Admitting: Cardiovascular Disease

## 2014-05-28 ENCOUNTER — Telehealth: Payer: Self-pay | Admitting: Cardiovascular Disease

## 2014-05-28 NOTE — Telephone Encounter (Signed)
Patient had sleep study and was told she needs CPAP and "they" would call her about getting it.  She hasn't heard from anyone and needs to know who to call or what to do.

## 2014-06-18 ENCOUNTER — Encounter: Payer: Self-pay | Admitting: Cardiovascular Disease

## 2014-07-13 ENCOUNTER — Ambulatory Visit: Payer: Medicaid Other | Admitting: Cardiovascular Disease

## 2014-08-03 ENCOUNTER — Ambulatory Visit (INDEPENDENT_AMBULATORY_CARE_PROVIDER_SITE_OTHER): Payer: Medicaid Other | Admitting: Cardiovascular Disease

## 2014-08-03 DIAGNOSIS — I10 Essential (primary) hypertension: Secondary | ICD-10-CM

## 2014-08-04 NOTE — Progress Notes (Signed)
No show

## 2014-08-16 ENCOUNTER — Ambulatory Visit: Payer: Medicaid Other | Admitting: Cardiovascular Disease

## 2014-08-17 ENCOUNTER — Telehealth: Payer: Self-pay | Admitting: Cardiovascular Disease

## 2014-08-17 NOTE — Telephone Encounter (Signed)
Patient states [facility] sent the police to her house because she was not using your CPAP machine this morning.  She states the police officer told they were checking in on her.  Ivin Booty, Glass blower/designer, got nasty with her when she called [facility].  Patient states she feels disrespected every time she has tried to communicate with [facility].   Patient reports she does NOT want [facility] to do anything for her.   She would like another DME company.  Will defer to Dr. Claiborne Billings and Mariann Laster to assist in arranging a new referral

## 2014-08-17 NOTE — Telephone Encounter (Signed)
Please call,pt is very, very upset. She no longer wants to use [facility],they just sent the police to her house ,because they said she was not using her C Pap machine.

## 2014-08-20 NOTE — Telephone Encounter (Signed)
Will refer to either [facility] or [facility]; wait until Mariann Laster is back so she will arrange.

## 2014-08-29 NOTE — Telephone Encounter (Signed)
Spoke with Ivin Booty @ [facility] on 08/24/14 to inform her the patient will be changing MDE providers. She states this is fine however the patient will need to return their CPAP machine to them before medicaid will approve her to get another one from a different company. They said that she can either bring the machine to them or leave it at our office and they will come and pick it up from [place]. The patient was informed of this. She is aware that I cannot do the new referral  Until she returns the other machine.

## 2014-11-30 ENCOUNTER — Telehealth: Payer: Self-pay | Admitting: *Deleted

## 2014-11-30 NOTE — Telephone Encounter (Signed)
Ivin Booty with [facility] called to inform me that they will be picking up patient's CPAP equipment on Monday 12/03/14. Patient usage is 2%. Which is non- compliant.

## 2014-12-17 ENCOUNTER — Ambulatory Visit: Payer: Medicaid Other | Admitting: Cardiovascular Disease

## 2015-03-07 ENCOUNTER — Emergency Department (HOSPITAL_COMMUNITY): Payer: Medicaid Other

## 2015-03-07 ENCOUNTER — Encounter (HOSPITAL_COMMUNITY): Payer: Self-pay

## 2015-03-07 ENCOUNTER — Observation Stay (HOSPITAL_COMMUNITY)
Admission: EM | Admit: 2015-03-07 | Discharge: 2015-03-08 | Disposition: A | Discharge status: Home / Self Care | Payer: Medicaid Other | Attending: Internal Medicine | Admitting: Internal Medicine

## 2015-03-07 ENCOUNTER — Other Ambulatory Visit: Payer: Self-pay

## 2015-03-07 DIAGNOSIS — Z6841 Body Mass Index (BMI) 40.0 and over, adult: Secondary | ICD-10-CM | POA: Diagnosis not present

## 2015-03-07 DIAGNOSIS — J45909 Unspecified asthma, uncomplicated: Secondary | ICD-10-CM | POA: Insufficient documentation

## 2015-03-07 DIAGNOSIS — I493 Ventricular premature depolarization: Secondary | ICD-10-CM | POA: Diagnosis not present

## 2015-03-07 DIAGNOSIS — J4520 Mild intermittent asthma, uncomplicated: Secondary | ICD-10-CM | POA: Insufficient documentation

## 2015-03-07 DIAGNOSIS — G4733 Obstructive sleep apnea (adult) (pediatric): Secondary | ICD-10-CM

## 2015-03-07 DIAGNOSIS — I10 Essential (primary) hypertension: Secondary | ICD-10-CM | POA: Diagnosis not present

## 2015-03-07 DIAGNOSIS — R002 Palpitations: Secondary | ICD-10-CM

## 2015-03-07 DIAGNOSIS — R7303 Prediabetes: Secondary | ICD-10-CM

## 2015-03-07 DIAGNOSIS — R0789 Other chest pain: Secondary | ICD-10-CM | POA: Diagnosis not present

## 2015-03-07 DIAGNOSIS — R000 Tachycardia, unspecified: Secondary | ICD-10-CM | POA: Insufficient documentation

## 2015-03-07 DIAGNOSIS — E119 Type 2 diabetes mellitus without complications: Secondary | ICD-10-CM | POA: Insufficient documentation

## 2015-03-07 DIAGNOSIS — Z7984 Long term (current) use of oral hypoglycemic drugs: Secondary | ICD-10-CM | POA: Insufficient documentation

## 2015-03-07 DIAGNOSIS — J101 Influenza due to other identified influenza virus with other respiratory manifestations: Secondary | ICD-10-CM | POA: Diagnosis present

## 2015-03-07 DIAGNOSIS — J09X2 Influenza due to identified novel influenza A virus with other respiratory manifestations: Secondary | ICD-10-CM | POA: Diagnosis not present

## 2015-03-07 DIAGNOSIS — K219 Gastro-esophageal reflux disease without esophagitis: Secondary | ICD-10-CM | POA: Insufficient documentation

## 2015-03-07 DIAGNOSIS — Z87891 Personal history of nicotine dependence: Secondary | ICD-10-CM | POA: Insufficient documentation

## 2015-03-07 DIAGNOSIS — J45991 Cough variant asthma: Secondary | ICD-10-CM | POA: Diagnosis present

## 2015-03-07 DIAGNOSIS — I252 Old myocardial infarction: Secondary | ICD-10-CM | POA: Diagnosis not present

## 2015-03-07 DIAGNOSIS — Z7982 Long term (current) use of aspirin: Secondary | ICD-10-CM | POA: Insufficient documentation

## 2015-03-07 DIAGNOSIS — J111 Influenza due to unidentified influenza virus with other respiratory manifestations: Secondary | ICD-10-CM | POA: Diagnosis not present

## 2015-03-07 DIAGNOSIS — E669 Obesity, unspecified: Secondary | ICD-10-CM | POA: Insufficient documentation

## 2015-03-07 DIAGNOSIS — Z9989 Dependence on other enabling machines and devices: Secondary | ICD-10-CM

## 2015-03-07 DIAGNOSIS — G8929 Other chronic pain: Secondary | ICD-10-CM | POA: Insufficient documentation

## 2015-03-07 DIAGNOSIS — M25569 Pain in unspecified knee: Secondary | ICD-10-CM | POA: Insufficient documentation

## 2015-03-07 HISTORY — DX: Type 2 diabetes mellitus without complications: E11.9

## 2015-03-07 HISTORY — DX: Pneumonia, unspecified organism: J18.9

## 2015-03-07 HISTORY — DX: Gastro-esophageal reflux disease without esophagitis: K21.9

## 2015-03-07 HISTORY — DX: Personal history of other medical treatment: Z92.89

## 2015-03-07 HISTORY — DX: Dependence on other enabling machines and devices: Z99.89

## 2015-03-07 HISTORY — DX: Obstructive sleep apnea (adult) (pediatric): G47.33

## 2015-03-07 LAB — BASIC METABOLIC PANEL
ANION GAP: 10 (ref 5–15)
BUN: 7 mg/dL (ref 6–20)
CALCIUM: 9.1 mg/dL (ref 8.9–10.3)
CO2: 24 mmol/L (ref 22–32)
CREATININE: 0.98 mg/dL (ref 0.44–1.00)
Chloride: 102 mmol/L (ref 101–111)
GLUCOSE: 371 mg/dL — AB (ref 65–99)
Potassium: 4 mmol/L (ref 3.5–5.1)
Sodium: 136 mmol/L (ref 135–145)

## 2015-03-07 LAB — HEPATIC FUNCTION PANEL
ALT: 20 U/L (ref 14–54)
AST: 24 U/L (ref 15–41)
Albumin: 3.2 g/dL — ABNORMAL LOW (ref 3.5–5.0)
Alkaline Phosphatase: 82 U/L (ref 38–126)
BILIRUBIN DIRECT: 0.1 mg/dL (ref 0.1–0.5)
BILIRUBIN INDIRECT: 0.6 mg/dL (ref 0.3–0.9)
Total Bilirubin: 0.7 mg/dL (ref 0.3–1.2)
Total Protein: 6.9 g/dL (ref 6.5–8.1)

## 2015-03-07 LAB — CBC
HCT: 42.7 % (ref 36.0–46.0)
HEMOGLOBIN: 13.8 g/dL (ref 12.0–15.0)
MCH: 27.5 pg (ref 26.0–34.0)
MCHC: 32.3 g/dL (ref 30.0–36.0)
MCV: 85.2 fL (ref 78.0–100.0)
PLATELETS: 167 10*3/uL (ref 150–400)
RBC: 5.01 MIL/uL (ref 3.87–5.11)
RDW: 13.9 % (ref 11.5–15.5)
WBC: 5.1 10*3/uL (ref 4.0–10.5)

## 2015-03-07 LAB — GLUCOSE, CAPILLARY: Glucose-Capillary: 415 mg/dL — ABNORMAL HIGH (ref 65–99)

## 2015-03-07 LAB — INFLUENZA PANEL BY PCR (TYPE A & B)
H1N1 flu by pcr: NOT DETECTED
Influenza A By PCR: POSITIVE — AB
Influenza B By PCR: NEGATIVE

## 2015-03-07 LAB — I-STAT TROPONIN, ED: TROPONIN I, POC: 0 ng/mL (ref 0.00–0.08)

## 2015-03-07 MED ORDER — ACETAMINOPHEN 325 MG PO TABS
650.0000 mg | ORAL_TABLET | Freq: Four times a day (QID) | ORAL | Status: DC | PRN
  Filled 2015-03-07: qty 2

## 2015-03-07 MED ORDER — ACETAMINOPHEN 650 MG RE SUPP: 650.0000 mg | Freq: Four times a day (QID) | RECTAL | Status: DC | PRN

## 2015-03-07 MED ORDER — ALBUTEROL SULFATE (2.5 MG/3ML) 0.083% IN NEBU
2.5000 mg | INHALATION_SOLUTION | RESPIRATORY_TRACT | Status: DC
  Administered 2015-03-07: 2.5 mg via RESPIRATORY_TRACT
  Filled 2015-03-07: qty 3

## 2015-03-07 MED ORDER — GUAIFENESIN ER 600 MG PO TB12: 600.0000 mg | ORAL_TABLET | Freq: Two times a day (BID) | ORAL | Status: DC | PRN

## 2015-03-07 MED ORDER — METOPROLOL SUCCINATE ER 100 MG PO TB24
100.0000 mg | ORAL_TABLET | Freq: Every day | ORAL | Status: DC
  Administered 2015-03-07 – 2015-03-08 (×2): 100 mg via ORAL
  Filled 2015-03-07 (×2): qty 1

## 2015-03-07 MED ORDER — ACETAMINOPHEN 325 MG PO TABS
650.0000 mg | ORAL_TABLET | Freq: Once | ORAL | Status: DC
  Filled 2015-03-07: qty 2

## 2015-03-07 MED ORDER — PREDNISONE 20 MG PO TABS: 60.0000 mg | ORAL_TABLET | Freq: Once | ORAL | Status: DC

## 2015-03-07 MED ORDER — IPRATROPIUM BROMIDE 0.02 % IN SOLN
0.5000 mg | Freq: Once | RESPIRATORY_TRACT | Status: AC
  Administered 2015-03-07: 0.5 mg via RESPIRATORY_TRACT
  Filled 2015-03-07: qty 2.5

## 2015-03-07 MED ORDER — ALBUTEROL (5 MG/ML) CONTINUOUS INHALATION SOLN
10.0000 mg/h | INHALATION_SOLUTION | Freq: Once | RESPIRATORY_TRACT | Status: DC
Start: 2015-03-07 — End: 2015-03-07

## 2015-03-07 MED ORDER — INSULIN ASPART 100 UNIT/ML ~~LOC~~ SOLN
9.0000 [IU] | Freq: Once | SUBCUTANEOUS | Status: AC
  Administered 2015-03-07: 9 [IU] via SUBCUTANEOUS

## 2015-03-07 MED ORDER — PANTOPRAZOLE SODIUM 40 MG PO TBEC
40.0000 mg | DELAYED_RELEASE_TABLET | Freq: Every day | ORAL | Status: DC
  Administered 2015-03-07 – 2015-03-08 (×2): 40 mg via ORAL
  Filled 2015-03-07 (×2): qty 1

## 2015-03-07 MED ORDER — OSELTAMIVIR PHOSPHATE 75 MG PO CAPS
75.0000 mg | ORAL_CAPSULE | Freq: Two times a day (BID) | ORAL | Status: DC
  Administered 2015-03-08 (×2): 75 mg via ORAL
  Filled 2015-03-07 (×3): qty 1

## 2015-03-07 MED ORDER — INSULIN ASPART 100 UNIT/ML ~~LOC~~ SOLN
0.0000 [IU] | Freq: Three times a day (TID) | SUBCUTANEOUS | Status: DC
  Administered 2015-03-07: 9 [IU] via SUBCUTANEOUS
  Administered 2015-03-08: 7 [IU] via SUBCUTANEOUS
  Administered 2015-03-08: 3 [IU] via SUBCUTANEOUS

## 2015-03-07 MED ORDER — IPRATROPIUM-ALBUTEROL 0.5-2.5 (3) MG/3ML IN SOLN: 3.0000 mL | Freq: Once | RESPIRATORY_TRACT | Status: DC

## 2015-03-07 MED ORDER — ALBUTEROL SULFATE HFA 108 (90 BASE) MCG/ACT IN AERS
4.0000 | INHALATION_SPRAY | Freq: Once | RESPIRATORY_TRACT | Status: AC
  Administered 2015-03-07: 4 via RESPIRATORY_TRACT
  Filled 2015-03-07: qty 6.7

## 2015-03-07 MED ORDER — BENZONATATE 100 MG PO CAPS
100.0000 mg | ORAL_CAPSULE | Freq: Once | ORAL | Status: AC
  Administered 2015-03-07: 100 mg via ORAL
  Filled 2015-03-07: qty 1

## 2015-03-07 MED ORDER — INSULIN ASPART 100 UNIT/ML ~~LOC~~ SOLN: 0.0000 [IU] | Freq: Every day | SUBCUTANEOUS | Status: DC

## 2015-03-07 MED ORDER — IBUPROFEN 200 MG PO TABS
600.0000 mg | ORAL_TABLET | Freq: Once | ORAL | Status: AC
Start: 2015-03-07 — End: 2015-03-07
  Administered 2015-03-07: 600 mg via ORAL
  Filled 2015-03-07: qty 1

## 2015-03-07 MED ORDER — SODIUM CHLORIDE 0.9 % IV BOLUS (SEPSIS)
1000.0000 mL | Freq: Once | INTRAVENOUS | Status: AC
  Administered 2015-03-07: 1000 mL via INTRAVENOUS

## 2015-03-07 MED ORDER — ASPIRIN EC 81 MG PO TBEC
81.0000 mg | DELAYED_RELEASE_TABLET | Freq: Every day | ORAL | Status: DC
  Administered 2015-03-07 – 2015-03-08 (×2): 81 mg via ORAL
  Filled 2015-03-07 (×2): qty 1

## 2015-03-07 NOTE — ED Notes (Signed)
Pt O2 sat at this time on RA is 87%. Pt placed on 2L nasal cannula sats up to 95%. Pt very dyspneic on exertion.

## 2015-03-07 NOTE — Progress Notes (Signed)
NURSING PROGRESS NOTE  AURIE CUSTIS LL:[id number] Admission Data: 03/07/2015 5:48 PM Attending Provider: Thayer Headings, MD PCP:No PCP Per Patient Code Status: Full  Rebecca Malone is a 50 y.o. female patient admitted from ED:  -No acute distress noted.  -No complaints of shortness of breath.  -No complaints of chest pain.    Blood pressure 110/97, pulse 99, temperature 99.1 F (37.3 C), temperature source Oral, resp. rate 20, height 5\' 4"  (1.626 m), weight 120.203 kg (265 lb), SpO2 96 %.   IV Fluids:  IV in place, occlusive dsg intact without redness, IV cath forearm right, condition patent and no redness none.   Allergies:  Advair diskus; Other; and Pork-derived products  Past Medical History:   has a past medical history of Asthma; Tachycardia (march 2014); Chest pain (march 2014); Hypertension; Obesity; Degenerative arthritis; and MI (myocardial infarction) [employer]) (March 2014).  Past Surgical History:   has no past surgical history on file.  Social History:   reports that she has been smoking Cigarettes.  She has a 7.5 pack-year smoking history. She does not have any smokeless tobacco history on file. She reports that she drinks alcohol.  Skin: Intact  Patient/Family orientated to room. Information packet given to patient/family. Admission inpatient armband information verified with patient/family to include name and date of birth and placed on patient arm. Side rails up x 2, fall assessment and education completed with patient/family. Patient/family able to verbalize understanding of risk associated with falls and verbalized understanding to call for assistance before getting out of bed. Call light within reach. Patient/family able to voice and demonstrate understanding of unit orientation instructions.    Will continue to evaluate and treat per MD orders.

## 2015-03-07 NOTE — ED Provider Notes (Signed)
CSN: [id number]     Arrival date & time 03/07/15  1151 History   First MD Initiated Contact with Patient 03/07/15 1436     Chief Complaint  Patient presents with  . cough, CP, fever      (Consider location/radiation/quality/duration/timing/severity/associated sxs/prior Treatment) Patient is a 50 y.o. female presenting with cough.  Cough Cough characteristics:  Non-productive Severity:  Moderate Timing:  Constant Progression:  Unchanged Chronicity:  New Relieved by:  None tried Worsened by:  Nothing tried Ineffective treatments:  None tried Associated symptoms: no chest pain, no chills, no fever, no rash and no shortness of breath     Past Medical History  Diagnosis Date  . Asthma   . Tachycardia march 2014    admitted to hospital  . Chest pain march 2014    admitted to hospital   . Hypertension   . Obesity   . Degenerative arthritis   . MI (myocardial infarction) [facility]) March 2014   History reviewed. No pertinent past surgical history. No family history on file. Social History  Substance Use Topics  . Smoking status: Current Every Day Smoker -- 0.50 packs/day for 15 years    Types: Cigarettes  . Smokeless tobacco: None  . Alcohol Use: Yes   OB History    No data available     Review of Systems  Constitutional: Negative for fever and chills.  HENT: Negative for nosebleeds.   Eyes: Negative for visual disturbance.  Respiratory: Positive for cough. Negative for shortness of breath.   Cardiovascular: Negative for chest pain.  Gastrointestinal: Negative for nausea, vomiting, abdominal pain, diarrhea and constipation.  Genitourinary: Negative for dysuria.  Skin: Negative for rash.  Neurological: Negative for weakness.  All other systems reviewed and are negative.     Allergies  Advair diskus; Other; and Pork-derived products  Home Medications   Prior to Admission medications   Medication Sig Start Date End Date Taking? Authorizing Provider  albuterol  (PROVENTIL HFA;VENTOLIN HFA) 108 (90 BASE) MCG/ACT inhaler Inhale 2 puffs into the lungs every 6 (six) hours as needed for shortness of breath. For asthma 04/09/12  Yes Sorin June Leap, MD  aspirin EC 81 MG EC tablet Take 1 tablet (81 mg total) by mouth daily. 04/09/12  Yes Sorin June Leap, MD  Blood Pressure Monitoring (BLOOD PRESSURE MONITOR AUTOMAT) DEVI 1 mL by Does not apply route daily. 06/12/13  Yes Mihai Croitoru, MD  HYDROcodone-acetaminophen (NORCO/VICODIN) 5-325 MG per tablet Take 2 tablets by mouth every 6 (six) hours as needed. 12/28/13  Yes Mihai Croitoru, MD  metFORMIN (GLUCOPHAGE) 500 MG tablet Take 500 mg by mouth 2 (two) times daily with a meal.   Yes Historical Provider, MD  metoprolol succinate (TOPROL XL) 50 MG 24 hr tablet Take 1 tablet (50 mg total) by mouth daily. Take with or immediately following a meal. 01/02/14  Yes Mihai Croitoru, MD  metoprolol succinate (TOPROL-XL) 100 MG 24 hr tablet Take 100 mg by mouth daily. Take with or immediately following a meal.   Yes Historical Provider, MD  naproxen sodium (ANAPROX) 220 MG tablet Take 440 mg by mouth daily as needed. For pain   Yes Historical Provider, MD  omeprazole (PRILOSEC) 20 MG capsule Take 1 capsule (20 mg total) by mouth 2 (two) times daily. 11/16/13  Yes Tanna Furry, MD  ondansetron (ZOFRAN ODT) 4 MG disintegrating tablet Take 1 tablet (4 mg total) by mouth every 8 (eight) hours as needed for nausea. 11/16/13  Yes Tanna Furry, MD  sucralfate (CARAFATE) 1 G tablet Take 1 tablet (1 g total) by mouth 4 (four) times daily. 11/16/13  Yes Tanna Furry, MD   BP 151/84 mmHg  Pulse 110  Temp(Src) 101.3 F (38.5 C) (Oral)  Resp 22  Ht 5\' 4"  (1.626 m)  Wt 120.203 kg  BMI 45.46 kg/m2  SpO2 93% Physical Exam  Constitutional: She is oriented to person, place, and time. No distress.  HENT:  Head: Normocephalic and atraumatic.  Eyes: EOM are normal. Pupils are equal, round, and reactive to light.  Neck: Normal range of motion. Neck  supple.  Cardiovascular: Normal rate and intact distal pulses.   Pulmonary/Chest: No respiratory distress. She has wheezes.  Abdominal: Soft. There is no tenderness.  Musculoskeletal: Normal range of motion.  Neurological: She is alert and oriented to person, place, and time.  Skin: No rash noted. She is not diaphoretic.  Psychiatric: She has a normal mood and affect.    ED Course  Procedures (including critical care time) Labs Review Labs Reviewed  BASIC METABOLIC PANEL - Abnormal; Notable for the following:    Glucose, Bld 371 (*)    All other components within normal limits  CBC  HEPATIC FUNCTION PANEL  I-STAT TROPOININ, ED    Imaging Review Dg Chest 2 View  03/07/2015  CLINICAL DATA:  Cough and shortness of breath for 2 days EXAM: CHEST  2 VIEW COMPARISON:  11/16/2013 FINDINGS: The heart size and mediastinal contours are within normal limits. Both lungs are clear. The visualized skeletal structures are unremarkable. IMPRESSION: No active cardiopulmonary disease. Electronically Signed   By: Inez Catalina M.D.   On: 03/07/2015 12:36   I have personally reviewed and evaluated these images and lab results as part of my medical decision-making.   EKG Interpretation   Date/Time:  Thursday March 07 2015 11:56:18 EST Ventricular Rate:  110 PR Interval:  130 QRS Duration: 82 QT Interval:  334 QTC Calculation: 452 R Axis:   59 Text Interpretation:  Sinus tachycardia Nonspecific T wave abnormality  Abnormal ECG Confirmed by Hazle Coca [phone]) on 03/07/2015 12:00:20 PM      MDM   Final diagnoses:  Asthma, unspecified asthma severity, uncomplicated    73 y F w pmh asthma comes in with a couple of days of cough/sob  Exam as above.  Wheezing, seems consistent with asthma. Will try albut.  Will obtain cbc/bmp/cxr  After albuterol, pt more wheezing.  Cbc/bmp unremarkable.  cxr w/o infiltrate, doubt pna.  Will give pred and admit for asthma exacerbation with hypoxia to 87%.  I  have considered PE but feel asthma is more likely given sob improving with albut.  Will hold off on dimer or cta pe study if pt continues to improve clinically     Jarome Matin, MD 03/07/15 [id number]  Ezequiel Essex, MD 03/07/15 2328

## 2015-03-07 NOTE — ED Notes (Addendum)
Patient here with dry cough, congestion, CP, fever and body aches x 2 days. Reports that she feels weak with same and near syncope when she coughs

## 2015-03-07 NOTE — H&P (Signed)
Date: 03/07/2015               Patient Name:  Rebecca Malone MRN: [id number]  DOB: [birth year] Age / Sex: 50 y.o., female   PCP: No Pcp Per Patient         Medical Service: Internal Medicine Teaching Service         Attending Physician: Dr. Ezequiel Essex, MD    First Contact: Dr. Loleta Chance Pager: [phone]  Second Contact: Dr. Charlott Rakes Pager: [phone]       After Hours (After 5p/  First Contact Pager: [phone]  weekends / holidays): Second Contact Pager: [phone]   Chief Complaint: "I've been coughing."  History of Present Illness:  Rebecca Malone is a 50 year old lady with mild intermittent asthma, obstructive sleep apnea, gastroesophageal reflux disease, and pre-diabetes presenting with cough.  Four days ago, she had a sudden onset of congestion, cough, fevers, myalgias, and malaise. Her symptoms have persisted and she's developed left costal rib pain when she coughs and presses on her chest. She denies any exertional chest pain or radiation. Her mother had a similar illness and is getting over it now. She did not get her flu shot this year. Otherwise, she denies wheezing, headaches, vomiting, or diarrhea; review of systems was otherwise non-revealing.  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 rate 22, height 5\' 4"  (1.626 m), weight 120.203 kg (265 lb), SpO2 93 %.  General: obese friendly lady resting in bed comfortably, appropriately conversational HEENT: no scleral icterus, extra-ocular muscles intact, oropharynx without lesions Cardiac: tachycardic with regular rhythm, no rubs, murmurs or gallops Pulm: breathing well, clear to auscultation bilaterally Abd: bowel sounds normal, soft, nondistended, non-tender Ext: warm and well perfused, without pedal edema Lymph: no cervical or supraclavicular lymphadenopathy Skin: no rash, hair, or nail changes Neuro: alert and oriented X3, cranial nerves II-XII grossly intact, moving all extremities well  Lab results: Basic Metabolic Panel:  Recent Labs  03/07/15 1204  NA 136  K 4.0  CL 102  CO2 24  GLUCOSE 371*  BUN 7  CREATININE 0.98  CALCIUM 9.1   CBC:  Recent Labs  03/07/15 1204  WBC 5.1  HGB 13.8  HCT 42.7  MCV 85.2  PLT 167   Imaging results:  Dg Chest 2 View  03/07/2015  CLINICAL DATA:  Cough and shortness of breath for 2 days EXAM: CHEST  2 VIEW COMPARISON:  11/16/2013 FINDINGS: The heart size and mediastinal contours are within normal limits. Both lungs are clear. The visualized skeletal structures are unremarkable. IMPRESSION: No active cardiopulmonary disease. Electronically Signed   By: Inez Catalina M.D.   On: 03/07/2015 12:36    Other  results: EKG: sinus tachycardia without ischemic changes  Assessment & Plan by Problem:  Acute bronchitis: Her acute dry cough, myalgias, and malaise sound most like a viral process such as influenza or acute bronchitis. Chest x-ray did not show any consolidations concerning for bacterial pneumonia so we won't give antibiotics. She's saturating well on room air. We'll check an influenza panel and continue albuterol nebulizers as needed. She's  not wheezing so I don't think there's any utility in systemic steroids.  -Checking influenza panel -Albuterol nebulizers every 4 hours while awake -Check oxygenation while walking tomorrow -Checking HIV and hepatitis C  Musculoskeletal chest pain: Her chest pain is directly related to coughing and is tender to the touch. Her EKG was non-ischemic and point-of-care troponin was normal. -Will not trend troponins  Mild intermittent asthma: She is not wheezing so I don't think this is an acute asthma exacerbation. I question whether she has asthma at all so outpatient PFTs would be helpful. -Outpatient pulmonary function tests  Obstructive sleep apnea: She has confirmed OSA and uses CPAP at home. -CPAP at night  Palpitations from PVCs: She has been evaluated by cardiology in the past; external cardiac monitor did not show arrhythmias. -Continue metoprolol succinate 100mg  daily  Pre-diabetes: Her a1c was 6.8 in 2014. She's on metformin 500mg  twice daily. -Checking a1c -Will send home on higher dose metformin pending a1c  Dispo: Disposition is deferred at this time, awaiting improvement of current medical problems. Anticipated discharge in approximately 1 day(s).   The patient does not have a current PCP (No Pcp Per Patient) and does need an [facility] hospital follow-up appointment after discharge. We'll schedule her an appointment in our clinic.  The patient does not know have transportation limitations that hinder transportation to clinic appointments.  Signed: Loleta Chance, MD 03/07/2015, 4:22 PM

## 2015-03-08 DIAGNOSIS — J09X2 Influenza due to identified novel influenza A virus with other respiratory manifestations: Secondary | ICD-10-CM | POA: Diagnosis not present

## 2015-03-08 DIAGNOSIS — R7303 Prediabetes: Secondary | ICD-10-CM | POA: Diagnosis not present

## 2015-03-08 LAB — GLUCOSE, CAPILLARY
GLUCOSE-CAPILLARY: 234 mg/dL — AB (ref 65–99)
Glucose-Capillary: 378 mg/dL — ABNORMAL HIGH (ref 65–99)
Glucose-Capillary: 333 mg/dL — ABNORMAL HIGH (ref 65–99)

## 2015-03-08 LAB — HIV-1 RNA ULTRAQUANT REFLEX TO GENTYP+
HIV-1 RNA BY PCR: <20 copies/mL
HIV-1 RNA Quant, Log: UNDETERMINED log10copy/mL

## 2015-03-08 LAB — HEPATITIS C ANTIBODY (REFLEX)

## 2015-03-08 LAB — HEMOGLOBIN A1C
Hgb A1c MFr Bld: 10.9 % — ABNORMAL HIGH (ref 4.8–5.6)
Mean Plasma Glucose: 266 mg/dL

## 2015-03-08 LAB — HCV COMMENT:

## 2015-03-08 MED ORDER — ALBUTEROL SULFATE (2.5 MG/3ML) 0.083% IN NEBU
2.5000 mg | INHALATION_SOLUTION | RESPIRATORY_TRACT | Status: DC
  Administered 2015-03-08 (×2): 2.5 mg via RESPIRATORY_TRACT
  Filled 2015-03-08 (×2): qty 3

## 2015-03-08 MED ORDER — ALBUTEROL SULFATE HFA 108 (90 BASE) MCG/ACT IN AERS: 2.0000 | INHALATION_SPRAY | Freq: Four times a day (QID) | RESPIRATORY_TRACT | Status: DC | PRN

## 2015-03-08 MED ORDER — METFORMIN HCL 500 MG PO TABS: 1000.0000 mg | ORAL_TABLET | Freq: Two times a day (BID) | ORAL | Status: DC

## 2015-03-08 MED ORDER — OSELTAMIVIR PHOSPHATE 75 MG PO CAPS: 75.0000 mg | ORAL_CAPSULE | Freq: Two times a day (BID) | ORAL | Status: DC

## 2015-03-08 MED ORDER — ACETAMINOPHEN 325 MG PO TABS: 650.0000 mg | ORAL_TABLET | Freq: Four times a day (QID) | ORAL | Status: DC | PRN

## 2015-03-08 MED ORDER — KETOROLAC TROMETHAMINE 30 MG/ML IJ SOLN
30.0000 mg | Freq: Three times a day (TID) | INTRAMUSCULAR | Status: DC | PRN
  Administered 2015-03-08: 30 mg via INTRAVENOUS
  Filled 2015-03-08: qty 1

## 2015-03-08 MED ORDER — KETOROLAC TROMETHAMINE 30 MG/ML IJ SOLN: 30.0000 mg | Freq: Three times a day (TID) | INTRAMUSCULAR | Status: DC | PRN

## 2015-03-08 NOTE — Progress Notes (Signed)
Reviewed discharge instructions with patient.  PIV removed.  Pt denied any needs at this time.  Pt taken to discharge location via wheelchair.

## 2015-03-08 NOTE — Discharge Summary (Signed)
Name: Rebecca Malone MRN: [id number] DOB: Apr 24, 1965 50 y.o. PCP: Ricke Hey, MD  Date of Admission: 03/07/2015  1:59 PM Date of Discharge: 03/08/2015 Attending Physician: Thayer Headings, MD  Discharge Diagnosis: 1. Influenza A 2. Type 2 diabetes  Discharge Medications:    Medication List    TAKE these medications        acetaminophen 325 MG tablet  Commonly known as:  TYLENOL  Take 2 tablets (650 mg total) by mouth every 6 (six) hours as needed for mild pain or moderate pain (or Fever >/= 101).     albuterol 108 (90 Base) MCG/ACT inhaler  Commonly known as:  PROVENTIL HFA;VENTOLIN HFA  Inhale 2 puffs into the lungs every 6 (six) hours as needed for shortness of breath. For asthma     aspirin 81 MG EC tablet  Take 1 tablet (81 mg total) by mouth daily.     Blood Pressure Monitor Automat Devi  1 mL by Does not apply route daily.     metFORMIN 500 MG tablet  Commonly known as:  GLUCOPHAGE  Take 2 tablets (1,000 mg total) by mouth 2 (two) times daily with a meal.     metoprolol succinate 100 MG 24 hr tablet  Commonly known as:  TOPROL-XL  Take 100 mg by mouth daily. Take with or immediately following a meal.     omeprazole 20 MG capsule  Commonly known as:  PRILOSEC  Take 1 capsule (20 mg total) by mouth 2 (two) times daily.     oseltamivir 75 MG capsule  Commonly known as:  TAMIFLU  Take 1 capsule (75 mg total) by mouth 2 (two) times daily.        Disposition and follow-up:   Ms.Rebecca Malone was discharged from [facility] in Good condition.  At the hospital follow up visit please address:  1. Consider starting her on basal insulin or Victoza given her a1c of 11  2. Follow-up HIV and hepatitis C  3. Consider outpatient pulmonary function tests given history of asthma  Procedures Performed:  Dg Chest 2 View  03/07/2015  CLINICAL DATA:  Cough and shortness of breath for 2 days EXAM: CHEST  2 VIEW COMPARISON:  11/16/2013 FINDINGS:  The heart size and mediastinal contours are within normal limits. Both lungs are clear. The visualized skeletal structures are unremarkable. IMPRESSION: No active cardiopulmonary disease. Electronically Signed   By: Inez Catalina M.D.   On: 03/07/2015 12:36    Admission HPI:   Ms. Rebecca Malone is a 50 year old lady with mild intermittent asthma, obstructive sleep apnea, gastroesophageal reflux disease, and pre-diabetes presenting with cough.  Four days ago, she had a sudden onset of congestion, cough, fevers, myalgias, and malaise. Her symptoms have persisted and she's developed left costal rib pain when she coughs and presses on her chest. She denies any exertional chest pain or radiation. Her mother had a similar illness and is getting over it now. She did not get her flu shot this year. Otherwise, she denies wheezing, headaches, vomiting, or diarrhea; review of systems was otherwise non-revealing.  Hospital Course by problem list:   1. Influenza A: She presented with congestion, cough, fevers, and myalgias, and was found to have influenza A. She was feeling better after getting some fluids, and was sent home on a 5 day course of oseltamivir.  2. Type 2 diabetes: Her a1c was 11 this admission, and was 6.8 back in 2014. She was on metformin  but had not been taking this medication in a while. She has hirsutism so I think she likely has polycystic ovarian syndrome. I think she'll need to start on either basal insulin or perhaps Victoza given her obesity. We increased her metformin to 1000mg  twice daily upon discharge.  Discharge Vitals:   BP 164/70 mmHg  Pulse 81  Temp(Src) 99.5 F (37.5 C) (Oral)  Resp 20  Ht 5\' 4"  (1.626 m)  Wt 120.203 kg (265 lb)  BMI 45.46 kg/m2  SpO2 95%  LMP 09/30/2012  Discharge Labs:  Results for orders placed or performed during the hospital encounter of 03/07/15 (from the past 24 hour(s))  Basic metabolic panel     Status: Abnormal   Collection Time: 03/07/15 12:04 PM    Result Value Ref Range   Sodium 136 135 - 145 mmol/L   Potassium 4.0 3.5 - 5.1 mmol/L   Chloride 102 101 - 111 mmol/L   CO2 24 22 - 32 mmol/L   Glucose, Bld 371 (H) 65 - 99 mg/dL   BUN 7 6 - 20 mg/dL   Creatinine, Ser 0.98 0.44 - 1.00 mg/dL   Calcium 9.1 8.9 - 10.3 mg/dL   GFR calc non Af Amer >60 >60 mL/min   GFR calc Af Amer >60 >60 mL/min   Anion gap 10 5 - 15  CBC     Status: None   Collection Time: 03/07/15 12:04 PM  Result Value Ref Range   WBC 5.1 4.0 - 10.5 K/uL   RBC 5.01 3.87 - 5.11 MIL/uL   Hemoglobin 13.8 12.0 - 15.0 g/dL   HCT 42.7 36.0 - 46.0 %   MCV 85.2 78.0 - 100.0 fL   MCH 27.5 26.0 - 34.0 pg   MCHC 32.3 30.0 - 36.0 g/dL   RDW 13.9 11.5 - 15.5 %   Platelets 167 150 - 400 K/uL  I-stat troponin, ED (not at [facility])     Status: None   Collection Time: 03/07/15 12:35 PM  Result Value Ref Range   Troponin i, poc 0.00 0.00 - 0.08 ng/mL   Comment 3          Glucose, capillary     Status: Abnormal   Collection Time: 03/07/15  5:38 PM  Result Value Ref Range   Glucose-Capillary 378 (H) 65 - 99 mg/dL  Hemoglobin A1c     Status: Abnormal   Collection Time: 03/07/15  6:06 PM  Result Value Ref Range   Hgb A1c MFr Bld 10.9 (H) 4.8 - 5.6 %   Mean Plasma Glucose 266 mg/dL  Hepatic function panel     Status: Abnormal   Collection Time: 03/07/15  6:10 PM  Result Value Ref Range   Total Protein 6.9 6.5 - 8.1 g/dL   Albumin 3.2 (L) 3.5 - 5.0 g/dL   AST 24 15 - 41 U/L   ALT 20 14 - 54 U/L   Alkaline Phosphatase 82 38 - 126 U/L   Total Bilirubin 0.7 0.3 - 1.2 mg/dL   Bilirubin, Direct 0.1 0.1 - 0.5 mg/dL   Indirect Bilirubin 0.6 0.3 - 0.9 mg/dL  Hepatitis c antibody (reflex)     Status: None   Collection Time: 03/07/15  6:10 PM  Result Value Ref Range   HCV Ab <0.1 0.0 - 0.9 s/co ratio  HCV Comment:     Status: None   Collection Time: 03/07/15  6:10 PM  Result Value Ref Range   Comment: Comment   Influenza panel by  PCR (type A & B, H1N1)     Status: Abnormal    Collection Time: 03/07/15  6:49 PM  Result Value Ref Range   Influenza A By PCR POSITIVE (A) NEGATIVE   Influenza B By PCR NEGATIVE NEGATIVE   H1N1 flu by pcr NOT DETECTED NOT DETECTED  Glucose, capillary     Status: Abnormal   Collection Time: 03/07/15 10:05 PM  Result Value Ref Range   Glucose-Capillary 415 (H) 65 - 99 mg/dL  Glucose, capillary     Status: Abnormal   Collection Time: 03/08/15  8:01 AM  Result Value Ref Range   Glucose-Capillary 234 (H) 65 - 99 mg/dL    Signed: Loleta Chance, MD 03/08/2015, 11:54 AM

## 2015-03-08 NOTE — Discharge Instructions (Signed)
We have made you a follow up appointment with our internal medicine center.  Please make this appointment.  For your flu, please get plenty of rest, use tylenol or ibuprofen as needed for aches and pains but use this sparingly.  Drink plenty of fluids and continue supportive care.  Also a prescription for Tamiflu was sent to your pharmacy.   You need to take this for 5 more days.  We are increasing your Metformin dose to 1000mg  twice daily from 500mg  twice daily because your diabetes is very poorly controlled.  In the future, you may require being on insulin but this will be discussed when you follow up as an outpatient.

## 2015-03-08 NOTE — Progress Notes (Signed)
Subjective: Ms. Giller is doing well this morning, sitting up in a chair, and without complaints.  She wants to follow up with [facility] and is ready to go home today.  She has the flu.  Objective: Vital signs in last 24 hours: Filed Vitals:   03/07/15 2100 03/07/15 2209 03/08/15 0623 03/08/15 1117  BP:  164/70    Pulse:  81    Temp:  99.5 F (37.5 C)    TempSrc:  Oral    Resp:  20    Height:      Weight:      SpO2: 96% 96% 95% 95%   Weight change:  No intake or output data in the 24 hours ending 03/08/15 1133 General: sitting up in chair, no distress HEENT: EOMI, no scleral icterus Pulm: normal work of breathing Ext: warm and well perfused, no pedal edema Neuro: alert and oriented, no focal deficits  Lab Results: Basic Metabolic Panel:  Recent Labs Lab 03/07/15 1204  NA 136  K 4.0  CL 102  CO2 24  GLUCOSE 371*  BUN 7  CREATININE 0.98  CALCIUM 9.1   Liver Function Tests:  Recent Labs Lab 03/07/15 1810  AST 24  ALT 20  ALKPHOS 82  BILITOT 0.7  PROT 6.9  ALBUMIN 3.2*   No results for input(s): LIPASE, AMYLASE in the last 168 hours. No results for input(s): AMMONIA in the last 168 hours. CBC:  Recent Labs Lab 03/07/15 1204  WBC 5.1  HGB 13.8  HCT 42.7  MCV 85.2  PLT 167   Cardiac Enzymes: No results for input(s): CKTOTAL, CKMB, CKMBINDEX, TROPONINI in the last 168 hours. BNP: No results for input(s): PROBNP in the last 168 hours. D-Dimer: No results for input(s): DDIMER in the last 168 hours. CBG:  Recent Labs Lab 03/07/15 1738 03/07/15 2205 03/08/15 0801  GLUCAP 378* 415* 234*   Hemoglobin A1C:  Recent Labs Lab 03/07/15 1806  HGBA1C 10.9*   Fasting Lipid Panel: No results for input(s): CHOL, HDL, LDLCALC, TRIG, CHOLHDL, LDLDIRECT in the last 168 hours. Thyroid Function Tests: No results for input(s): TSH, T4TOTAL, FREET4, T3FREE, THYROIDAB in the last 168 hours. Coagulation: No results for input(s): LABPROT, INR in the last 168  hours. Anemia Panel: No results for input(s): VITAMINB12, FOLATE, FERRITIN, TIBC, IRON, RETICCTPCT in the last 168 hours. Urine Drug Screen: Drugs of Abuse  No results found for: LABOPIA, COCAINSCRNUR, LABBENZ, AMPHETMU, THCU, LABBARB  Alcohol Level: No results for input(s): ETH in the last 168 hours. Urinalysis: No results for input(s): COLORURINE, LABSPEC, PHURINE, GLUCOSEU, HGBUR, BILIRUBINUR, KETONESUR, PROTEINUR, UROBILINOGEN, NITRITE, LEUKOCYTESUR in the last 168 hours.  Invalid input(s): APPERANCEUR Misc. Labs:   Micro Results: No results found for this or any previous visit (from the past 240 hour(s)). Studies/Results: Dg Chest 2 View  03/07/2015  CLINICAL DATA:  Cough and shortness of breath for 2 days EXAM: CHEST  2 VIEW COMPARISON:  11/16/2013 FINDINGS: The heart size and mediastinal contours are within normal limits. Both lungs are clear. The visualized skeletal structures are unremarkable. IMPRESSION: No active cardiopulmonary disease. Electronically Signed   By: Inez Catalina M.D.   On: 03/07/2015 12:36   Medications: I have reviewed the patient's current medications. Scheduled Meds: . albuterol  2.5 mg Nebulization Q4H WA  . aspirin EC  81 mg Oral Daily  . insulin aspart  0-5 Units Subcutaneous QHS  . insulin aspart  0-9 Units Subcutaneous TID WC  . metoprolol succinate  100 mg Oral  Daily  . oseltamivir  75 mg Oral BID  . pantoprazole  40 mg Oral Daily   Continuous Infusions:  PRN Meds:.acetaminophen **OR** acetaminophen, guaiFENesin, ketorolac Assessment/Plan: Principal Problem:   Influenza A Active Problems:   Asthma  Influenza: Her acute dry cough, myalgias, and malaise are likely due to her influenza. Chest x-ray did not show any consolidations concerning for bacterial pneumonia so we did not give antibiotics. She's saturating well on room air. We'll check continue albuterol nebulizers as needed while inpatient. -Influenza A positive -Albuterol nebulizers  every 4 hours while awake -Home today with supportive care - ibuprofen, tylenol and plenty of fluids -Checking HIV -HCV negative  Mild intermittent asthma: She is not wheezing so I don't think this is an acute asthma exacerbation. I question whether she has asthma at all so outpatient PFTs would be helpful. -Outpatient pulmonary function tests  Obstructive sleep apnea: She has confirmed OSA and uses CPAP at home. -CPAP at night  Palpitations from PVCs: She has been evaluated by cardiology in the past; external cardiac monitor did not show arrhythmias. -Continue metoprolol succinate 100mg  daily  Diabetes Mellitus Type 2:  Her a1c was 6.8 in 2014. She's on metformin 500mg  twice daily. -A1c here is 10.9 -increase metformin to 1000mg  twice daily -follow up outpatient to initiate insulin therapy  Dispo: Home today  The patient does not have a current PCP Ricke Hey, MD) and does need an [facility] hospital follow-up appointment after discharge.  The patient does not have transportation limitations that hinder transportation to clinic appointments.  .Services Needed at time of discharge: Y = Yes, Blank = No PT:   OT:   RN:   Equipment:   Other:       Jule Ser, DO 03/08/2015, 11:33 AM

## 2015-03-18 ENCOUNTER — Telehealth: Payer: Self-pay | Admitting: Family Medicine

## 2015-03-18 NOTE — Telephone Encounter (Signed)
APPT REMINDER CALL, LMTCB IF SHE NEEDS TO CANCEL °

## 2015-03-19 ENCOUNTER — Ambulatory Visit: Payer: Medicaid Other | Admitting: Internal Medicine

## 2015-12-27 ENCOUNTER — Ambulatory Visit: Payer: Medicaid Other | Admitting: Obstetrics

## 2016-01-30 ENCOUNTER — Ambulatory Visit: Payer: Medicaid Other | Admitting: Obstetrics

## 2016-02-04 ENCOUNTER — Emergency Department (HOSPITAL_COMMUNITY)
Admission: EM | Admit: 2016-02-04 | Discharge: 2016-02-04 | Disposition: A | Discharge status: Home / Self Care | Payer: Medicaid Other | Attending: Emergency Medicine | Admitting: Emergency Medicine

## 2016-02-04 ENCOUNTER — Emergency Department (HOSPITAL_COMMUNITY): Payer: Medicaid Other

## 2016-02-04 ENCOUNTER — Encounter (HOSPITAL_COMMUNITY): Payer: Self-pay

## 2016-02-04 DIAGNOSIS — I252 Old myocardial infarction: Secondary | ICD-10-CM | POA: Insufficient documentation

## 2016-02-04 DIAGNOSIS — J45909 Unspecified asthma, uncomplicated: Secondary | ICD-10-CM | POA: Insufficient documentation

## 2016-02-04 DIAGNOSIS — Z7982 Long term (current) use of aspirin: Secondary | ICD-10-CM | POA: Insufficient documentation

## 2016-02-04 DIAGNOSIS — J209 Acute bronchitis, unspecified: Secondary | ICD-10-CM

## 2016-02-04 DIAGNOSIS — Z87891 Personal history of nicotine dependence: Secondary | ICD-10-CM | POA: Insufficient documentation

## 2016-02-04 DIAGNOSIS — I10 Essential (primary) hypertension: Secondary | ICD-10-CM | POA: Insufficient documentation

## 2016-02-04 DIAGNOSIS — E1140 Type 2 diabetes mellitus with diabetic neuropathy, unspecified: Secondary | ICD-10-CM | POA: Insufficient documentation

## 2016-02-04 MED ORDER — PREDNISONE 20 MG PO TABS
60.0000 mg | ORAL_TABLET | ORAL | Status: AC
  Administered 2016-02-04: 60 mg via ORAL
  Filled 2016-02-04: qty 3

## 2016-02-04 MED ORDER — ALBUTEROL SULFATE (2.5 MG/3ML) 0.083% IN NEBU
5.0000 mg | INHALATION_SOLUTION | Freq: Once | RESPIRATORY_TRACT | Status: AC
  Administered 2016-02-04: 5 mg via RESPIRATORY_TRACT

## 2016-02-04 MED ORDER — ALBUTEROL SULFATE HFA 108 (90 BASE) MCG/ACT IN AERS
2.0000 | INHALATION_SPRAY | Freq: Four times a day (QID) | RESPIRATORY_TRACT | Status: DC
  Administered 2016-02-04: 2 via RESPIRATORY_TRACT
  Filled 2016-02-04: qty 6.7

## 2016-02-04 MED ORDER — ALBUTEROL SULFATE (2.5 MG/3ML) 0.083% IN NEBU
INHALATION_SOLUTION | RESPIRATORY_TRACT | Status: AC
  Filled 2016-02-04: qty 6

## 2016-02-04 MED ORDER — PREDNISONE 20 MG PO TABS: 40.0000 mg | ORAL_TABLET | Freq: Every day | ORAL | 0 refills | Status: DC

## 2016-02-04 NOTE — ED Notes (Signed)
Pt called out and asked about wait times. Pt informed of longest wait time. Breath sounds reassessed and clear, pt does not appear to be in any acute distress.

## 2016-02-04 NOTE — ED Triage Notes (Signed)
Onset 2 days cough, shortness of breath.  Is out of Albuterol inhaler.  Cough causing vomiting.  Pt has labored breathing, talking in short sentences.  Lungs are clear.

## 2016-02-04 NOTE — ED Provider Notes (Signed)
[facility] DEPT Provider Note   CSN: [id number] Arrival date & time: 02/04/16  1513   By signing my name below, I, Soijett Blue, attest that this documentation has been prepared under the direction and in the presence of Carmin Muskrat, MD. Electronically Signed: Soijett Blue, ED Scribe. 02/04/16. 7:53 PM.  History   Chief Complaint Chief Complaint  Patient presents with  . Shortness of Breath    HPI Rebecca Malone is a 51 y.o. female with a PMHx of asthma, HTN, DM, who presents to the Emergency Department brought in by EMS complaining of SOB onset 2 days ago. Pt notes that she didn't obtain her flu vaccination this past year. Pt is having associated symptoms of left sided CP x burning sensation, left arm pain, cough, nausea, vomiting x 2 days, max fever of 103, voice change, and HA. She has tried albuterol inhaler with no relief of her symptoms. Pt notes that she is out of her albuterol inhaler that she takes for her hx of asthma. She denies abdominal pain, LOC, and any other symptoms.    The history is provided by the patient. No language interpreter was used.    Past Medical History:  Diagnosis Date  . Asthma   . Chest pain march 2014   admitted to hospital   . Degenerative arthritis    "knees" (03/07/2015)  . GERD (gastroesophageal reflux disease)   . History of blood transfusion 10/2009   "related to losing too much blood w/my period"  . Hypertension   . MI (myocardial infarction) March 2014  . Obesity   . OSA on CPAP   . Pneumonia 2008  . Tachycardia march 2014   admitted to hospital  . Type II diabetes mellitus [facility])     Patient Active Problem List   Diagnosis Date Noted  . Influenza A 03/07/2015  . HTN (hypertension) 06/09/2013  . Palpitations 06/07/2012  . Smoker 06/07/2012  . Acute asthma exacerbation 04/08/2012  . CAP (community acquired pneumonia) 04/08/2012  . SOB (shortness of breath) 04/08/2012  . Diabetes mellitus ([place]) 09/11/2011  .  Degenerative arthritis of knee 09/11/2011  . 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 Chest pain on exertion 09/09/2011  . Edema extremities 09/09/2011  . Anemia 09/09/2011  . OBESITY, MORBID 06/28/2008  . Asthma 06/28/2008  . BACK PAIN, LUMBAR 06/28/2008    Past Surgical History:  Procedure Laterality Date  . CESAREAN SECTION  1999    OB History    No data available       Home Medications    Prior to Admission medications   Medication Sig Start Date End Date Taking? Authorizing Provider  acetaminophen (TYLENOL) 325 MG tablet Take 2 tablets (650 mg total) by mouth every 6 (six) hours as needed for mild pain or moderate pain (or Fever >/= 101). 03/08/15   Jule Ser, DO  albuterol (PROVENTIL HFA;VENTOLIN HFA) 108 (90 Base) MCG/ACT inhaler Inhale 2 puffs into the lungs every 6 (six) hours as needed for shortness of breath. For asthma 03/08/15   Jule Ser, DO  aspirin EC 81 MG EC tablet Take 1 tablet (81 mg total) by mouth daily. 04/09/12   Sorin June Leap, MD  Blood Pressure Monitoring (BLOOD PRESSURE MONITOR AUTOMAT) DEVI 1 mL by Does not apply route daily. 06/12/13   Mihai Croitoru, MD  metFORMIN (GLUCOPHAGE) 500 MG tablet Take 2 tablets (1,000 mg total) by mouth 2 (two) times daily with a meal. 03/08/15   Jule Ser, DO  metoprolol succinate (TOPROL-XL) 100 MG 24 hr tablet Take 100 mg by mouth daily. Take with or immediately following a meal.    Historical Provider, MD  omeprazole (PRILOSEC) 20 MG capsule Take 1 capsule (20 mg total) by mouth 2 (two) times daily. 11/16/13   Tanna Furry, MD  oseltamivir (TAMIFLU) 75 MG capsule Take 1 capsule (75 mg total) by mouth 2 (two) times daily. 03/08/15   Jule Ser, DO    Family History History reviewed. No pertinent family history.  Social History Social History  Substance Use Topics  . Smoking status: Former Smoker    Packs/day: 0.12    Years: 20.00    Types: Cigarettes    Quit date: 02/10/2015  .  Smokeless tobacco: Never Used  . Alcohol use Yes     Comment: 03/07/2015 "might have 4 mixed drinks/month"     Allergies   Advair diskus [fluticasone-salmeterol]; Other; and Pork-derived products   Review of Systems Review of Systems  Constitutional:       Per HPI, otherwise negative  HENT:       Per HPI, otherwise negative  Respiratory: Positive for shortness of breath.        Per HPI, otherwise negative  Cardiovascular:       Per HPI, otherwise negative  Gastrointestinal: Negative for vomiting.  Endocrine:       Negative aside from HPI  Genitourinary:       Neg aside from HPI   Musculoskeletal:       Per HPI, otherwise negative  Skin: Negative.   Neurological: Negative for syncope.    Physical Exam Updated Vital Signs BP 145/72 (BP Location: Right Arm)   Pulse 100   Temp 98.2 F (36.8 C) (Oral)   Resp 20   LMP 09/30/2012   SpO2 99%   Physical Exam  Constitutional: She is oriented to person, place, and time. She appears well-developed and well-nourished. No distress.  HENT:  Head: Normocephalic and atraumatic.  Eyes: Conjunctivae and EOM are normal.  Cardiovascular: Normal rate and regular rhythm.   Pulmonary/Chest: Effort normal. No stridor. No respiratory distress.  Diminished breath sounds bilaterally.  Abdominal: She exhibits no distension.  Musculoskeletal: She exhibits no edema.  Neurological: She is alert and oriented to person, place, and time. No cranial nerve deficit.  Skin: Skin is warm and dry.  Psychiatric: She has a normal mood and affect.  Nursing note and vitals reviewed.   ED Treatments / Results  DIAGNOSTIC STUDIES: Oxygen Saturation is 99% on RA, nl by my interpretation.    COORDINATION OF CARE: 7:50 PM Discussed treatment plan with pt at bedside which includes albuterol inhaler, CXR, prednisone Rx, follow up with PCP, and pt agreed to plan.   Radiology Dg Chest 2 View  Result Date: 02/04/2016 CLINICAL DATA:  Chest pain radiating  down the left arm associated with shortness of breath, vomiting, and chills for the past 2 days. History of MI in 2014. History of asthma and diabetes. Discontinue smoking 1 year ago. EXAM: CHEST  2 VIEW COMPARISON:  PA and lateral chest x-ray dated March 07, 2015 FINDINGS: The lungs are well-expanded. There is linear density lateral to the left heart border peripherally in the left lung. It is not visualized on the lateral view. There is no pleural effusion or alveolar infiltrate. The heart and pulmonary vascularity are normal. The mediastinum is normal in width. The trachea is midline. The bony thorax exhibits no acute abnormality. IMPRESSION: Mild chronic bronchitic change. Subsegmental atelectasis  in the left lower lung laterally. No discrete pneumonia. Electronically Signed   By: David  Martinique M.D.   On: 02/04/2016 16:17    Procedures Procedures (including critical care time)  Medications Ordered in ED Medications  albuterol (PROVENTIL) (2.5 MG/3ML) 0.083% nebulizer solution (not administered)  albuterol (PROVENTIL) (2.5 MG/3ML) 0.083% nebulizer solution 5 mg (5 mg Nebulization Given 02/04/16 1528)    EKG Interpretation  Date/Time:  Tuesday February 04 2016 15:23:05 EST Ventricular Rate:  91 PR Interval:  126 QRS Duration: 82 QT Interval:  374 QTC Calculation: 460 R Axis:   54 Text Interpretation:  Normal sinus rhythm Nonspecific T wave abnormality No significant change since last tracing Abnormal ekg Confirmed by Carmin Muskrat  MD [phone]) on 02/04/2016 8:36:10 PM        Initial Impression / Assessment and Plan / ED Course  I have reviewed the triage vital signs and the nursing notes.  Pertinent imaging results that were available during my care of the patient were reviewed by me and considered in my medical decision making (see chart for details).  Clinical Course     Patient with a history of asthma presents with concern of ongoing chest pain, dyspnea. Here she is awake and  alert, has improved essentially after multiple albuterol treatment sessions. No evidence for ACS, pneumonia, bacteremia, sepsis. Patient likely has bronchitis. Patient started on steroids, scheduled albuterol, discharged in stable condition.  Final Clinical Impressions(s) / ED Diagnoses   Final diagnoses:  Acute bronchitis, unspecified organism    New Prescriptions New Prescriptions   PREDNISONE (DELTASONE) 20 MG TABLET    Take 2 tablets (40 mg total) by mouth daily with breakfast. For the next four days   I personally performed the services described in this documentation, which was scribed in my presence. The recorded information has been reviewed and is accurate.       Carmin Muskrat, MD 02/04/16 2036

## 2016-11-18 ENCOUNTER — Emergency Department (HOSPITAL_COMMUNITY): Payer: Self-pay

## 2016-11-18 ENCOUNTER — Encounter (HOSPITAL_COMMUNITY): Payer: Self-pay | Admitting: *Deleted

## 2016-11-18 ENCOUNTER — Emergency Department (HOSPITAL_COMMUNITY)
Admission: EM | Admit: 2016-11-18 | Discharge: 2016-11-18 | Disposition: A | Discharge status: Home / Self Care | Payer: Self-pay | Attending: Emergency Medicine | Admitting: Emergency Medicine

## 2016-11-18 DIAGNOSIS — R072 Precordial pain: Secondary | ICD-10-CM | POA: Insufficient documentation

## 2016-11-18 DIAGNOSIS — R112 Nausea with vomiting, unspecified: Secondary | ICD-10-CM | POA: Insufficient documentation

## 2016-11-18 DIAGNOSIS — K5792 Diverticulitis of intestine, part unspecified, without perforation or abscess without bleeding: Secondary | ICD-10-CM | POA: Insufficient documentation

## 2016-11-18 DIAGNOSIS — R1084 Generalized abdominal pain: Secondary | ICD-10-CM | POA: Insufficient documentation

## 2016-11-18 DIAGNOSIS — N939 Abnormal uterine and vaginal bleeding, unspecified: Secondary | ICD-10-CM | POA: Insufficient documentation

## 2016-11-18 DIAGNOSIS — I252 Old myocardial infarction: Secondary | ICD-10-CM | POA: Insufficient documentation

## 2016-11-18 DIAGNOSIS — J45909 Unspecified asthma, uncomplicated: Secondary | ICD-10-CM | POA: Insufficient documentation

## 2016-11-18 DIAGNOSIS — A5901 Trichomonal vulvovaginitis: Secondary | ICD-10-CM | POA: Insufficient documentation

## 2016-11-18 DIAGNOSIS — E1140 Type 2 diabetes mellitus with diabetic neuropathy, unspecified: Secondary | ICD-10-CM | POA: Insufficient documentation

## 2016-11-18 DIAGNOSIS — Z87891 Personal history of nicotine dependence: Secondary | ICD-10-CM | POA: Insufficient documentation

## 2016-11-18 LAB — URINALYSIS, ROUTINE W REFLEX MICROSCOPIC
BILIRUBIN URINE: NEGATIVE
GLUCOSE, UA: 150 mg/dL — AB
HGB URINE DIPSTICK: NEGATIVE
Ketones, ur: NEGATIVE mg/dL
NITRITE: NEGATIVE
PH: 5 (ref 5.0–8.0)
Protein, ur: NEGATIVE mg/dL
SPECIFIC GRAVITY, URINE: 1.031 — AB (ref 1.005–1.030)

## 2016-11-18 LAB — BASIC METABOLIC PANEL
Anion gap: 8 (ref 5–15)
BUN: 11 mg/dL (ref 6–20)
CHLORIDE: 107 mmol/L (ref 101–111)
CO2: 23 mmol/L (ref 22–32)
Calcium: 9 mg/dL (ref 8.9–10.3)
Creatinine, Ser: 0.85 mg/dL (ref 0.44–1.00)
GFR calc Af Amer: >60 mL/min (ref >60)
GFR calc non Af Amer: >60 mL/min (ref >60)
Glucose, Bld: 263 mg/dL — ABNORMAL HIGH (ref 65–99)
Potassium: 3.7 mmol/L (ref 3.5–5.1)
SODIUM: 138 mmol/L (ref 135–145)

## 2016-11-18 LAB — I-STAT TROPONIN, ED: Troponin i, poc: 0 ng/mL (ref 0.00–0.08)

## 2016-11-18 LAB — WET PREP, GENITAL
SPERM: NONE SEEN
Yeast Wet Prep HPF POC: NONE SEEN

## 2016-11-18 LAB — CBC
HEMATOCRIT: 37.8 % (ref 36.0–46.0)
Hemoglobin: 12 g/dL (ref 12.0–15.0)
MCH: 26.7 pg (ref 26.0–34.0)
MCHC: 31.7 g/dL (ref 30.0–36.0)
MCV: 84.2 fL (ref 78.0–100.0)
Platelets: 238 10*3/uL (ref 150–400)
RBC: 4.49 MIL/uL (ref 3.87–5.11)
RDW: 14.1 % (ref 11.5–15.5)
WBC: 8.2 10*3/uL (ref 4.0–10.5)

## 2016-11-18 LAB — I-STAT BETA HCG BLOOD, ED (MC, WL, AP ONLY)

## 2016-11-18 MED ORDER — CEFTRIAXONE SODIUM 250 MG IJ SOLR
250.0000 mg | Freq: Once | INTRAMUSCULAR | Status: AC
  Administered 2016-11-18: 250 mg via INTRAMUSCULAR
  Filled 2016-11-18: qty 250

## 2016-11-18 MED ORDER — ONDANSETRON 4 MG PO TBDP: 4.0000 mg | ORAL_TABLET | Freq: Three times a day (TID) | ORAL | 0 refills | Status: DC | PRN

## 2016-11-18 MED ORDER — CIPROFLOXACIN HCL 500 MG PO TABS: 500.0000 mg | ORAL_TABLET | Freq: Two times a day (BID) | ORAL | 0 refills | Status: DC

## 2016-11-18 MED ORDER — SUCRALFATE 1 G PO TABS: 1.0000 g | ORAL_TABLET | Freq: Three times a day (TID) | ORAL | 0 refills | Status: DC

## 2016-11-18 MED ORDER — ONDANSETRON 4 MG PO TBDP
4.0000 mg | ORAL_TABLET | Freq: Once | ORAL | Status: AC
  Administered 2016-11-18: 4 mg via ORAL
  Filled 2016-11-18: qty 1

## 2016-11-18 MED ORDER — ALBUTEROL SULFATE HFA 108 (90 BASE) MCG/ACT IN AERS: 2.0000 | INHALATION_SPRAY | Freq: Four times a day (QID) | RESPIRATORY_TRACT | 1 refills | Status: DC | PRN

## 2016-11-18 MED ORDER — METOPROLOL SUCCINATE ER 100 MG PO TB24: 100.0000 mg | ORAL_TABLET | Freq: Every day | ORAL | 0 refills | Status: DC

## 2016-11-18 MED ORDER — METRONIDAZOLE 500 MG PO TABS
1500.0000 mg | ORAL_TABLET | Freq: Once | ORAL | Status: AC
  Administered 2016-11-18: 1500 mg via ORAL
  Filled 2016-11-18: qty 3

## 2016-11-18 MED ORDER — CIPROFLOXACIN HCL 500 MG PO TABS
500.0000 mg | ORAL_TABLET | Freq: Once | ORAL | Status: AC
  Administered 2016-11-18: 500 mg via ORAL
  Filled 2016-11-18: qty 1

## 2016-11-18 MED ORDER — AZITHROMYCIN 250 MG PO TABS
1000.0000 mg | ORAL_TABLET | Freq: Once | ORAL | Status: AC
  Administered 2016-11-18: 1000 mg via ORAL
  Filled 2016-11-18: qty 4

## 2016-11-18 MED ORDER — METRONIDAZOLE 500 MG PO TABS
500.0000 mg | ORAL_TABLET | Freq: Once | ORAL | Status: AC
  Administered 2016-11-18: 500 mg via ORAL
  Filled 2016-11-18: qty 1

## 2016-11-18 MED ORDER — METRONIDAZOLE 500 MG PO TABS: 500.0000 mg | ORAL_TABLET | Freq: Two times a day (BID) | ORAL | 0 refills | Status: DC

## 2016-11-18 MED ORDER — METFORMIN HCL 500 MG PO TABS: 1000.0000 mg | ORAL_TABLET | Freq: Two times a day (BID) | ORAL | 1 refills | Status: DC

## 2016-11-18 MED ORDER — LIDOCAINE HCL (PF) 1 % IJ SOLN
5.0000 mL | Freq: Once | INTRAMUSCULAR | Status: AC
  Administered 2016-11-18: 5 mL
  Filled 2016-11-18: qty 5

## 2016-11-18 NOTE — ED Provider Notes (Signed)
Emergency Department Provider Note   I have reviewed the triage vital signs and the nursing notes.   HISTORY  Chief Complaint Chest Pain and Vaginal Bleeding   HPI Rebecca Malone is a 51 y.o. female with PMH of GERD, HTN, DM, and CAD presented to the emergency department for evaluation multiple complaints including nausea vomiting, chest pain, vaginal spotting, and severe right flank pain.  Patient also has been experiencing some epigastric discomfort.  Symptoms are intermittent with no obvious provoking factors.  No radiation of symptoms.  Patient has been off of all medications for the past month because of poor PCP follow up.  Patient states his symptoms are concerned her most to her vaginal.  She is not having any chest pain or shortness of breath currently.  She was sexually active in July but nothing since.  Her last menstrual period was 2014.  She does note a history of fibroids and has followed with an OB/GYN in the past.  No fevers or chills.  No exertional symptoms.   Past Medical History:  Diagnosis Date  . Asthma   . Chest pain march 2014   admitted to hospital   . Degenerative arthritis    "knees" (03/07/2015)  . GERD (gastroesophageal reflux disease)   . History of blood transfusion 10/2009   "related to losing too much blood w/my period"  . Hypertension   . MI (myocardial infarction) [facility]) March 2014  . Obesity   . OSA on CPAP   . Pneumonia 2008  . Tachycardia march 2014   admitted to hospital  . Type II diabetes mellitus [facility])     Patient Active Problem List   Diagnosis Date Noted  . Influenza A 03/07/2015  . HTN (hypertension) 06/09/2013  . Palpitations 06/07/2012  . Smoker 06/07/2012  . Acute asthma exacerbation 04/08/2012  . CAP (community acquired pneumonia) 04/08/2012  . SOB (shortness of breath) 04/08/2012  . Diabetes mellitus ([place]) 09/11/2011  . Degenerative arthritis of knee 09/11/2011  . 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 Chest pain on exertion 09/09/2011  . Edema extremities 09/09/2011  . Anemia 09/09/2011  . OBESITY, MORBID 06/28/2008  . Asthma 06/28/2008  . BACK PAIN, LUMBAR 06/28/2008    Past Surgical History:  Procedure Laterality Date  . CESAREAN SECTION  1999    Current Outpatient Rx  . Order #: [id number] Class: No Print  . Order #: [id number] Class: OTC  . Order #: [id number] Class: Historical Med  . Order #: [id number] Class: Print  . Order #: [id number] Class: Print  . Order #: [id number] Class: Print  . Order #: [id number] Class: Normal  . Order #: [id number] Class: Print  . Order #: [id number] Class: Print  . Order #: [id number] Class: Print  . Order #: [id number] Class: Print  . Order #: [id number] Class: Print  . Order #: [id number] Class: Normal  . Order #: [id number] Class: Print    Allergies Advair diskus [fluticasone-salmeterol]; Other; and Pork-derived products  No family history on file.  Social History Social History  Substance Use Topics  . Smoking status: Former Smoker    Packs/day: 0.12    Years: 20.00    Types: Cigarettes    Quit date: 02/10/2015  . Smokeless tobacco: Never Used  . Alcohol use Yes     Comment: 03/07/2015 "might have 4 mixed drinks/month"    Review of Systems  Constitutional: No fever/chills Eyes: No visual changes. ENT: No sore throat. Cardiovascular: Positive chest pain. Respiratory: Denies shortness of breath. Gastrointestinal: Positive abdominal pain. Positive nausea  and vomiting.  No diarrhea.  No constipation. Genitourinary: Negative for dysuria. Positive vaginal spotting.  Musculoskeletal: Negative for back pain. Skin: Negative for rash. Neurological: Negative for headaches, focal weakness or numbness.  10-point ROS otherwise negative.  ____________________________________________   PHYSICAL EXAM:  VITAL SIGNS: ED Triage Vitals  Enc Vitals Group     BP 11/18/16 1338 (!) 170/91     Pulse Rate 11/18/16 1338 86     Resp 11/18/16 1338 18     Temp  11/18/16 1338 98.5 F (36.9 C)     Temp Source 11/18/16 1338 Oral     SpO2 11/18/16 1338 98 %     Weight 11/18/16 1339 265 lb (120.2 kg)     Height 11/18/16 1339 5\' 5"  (1.651 m)     Pain Score 11/18/16 1337 10    Constitutional: Alert and oriented. Well appearing and in no acute distress. Eyes: Conjunctivae are normal. Head: Atraumatic. Nose: No congestion/rhinnorhea. Mouth/Throat: Mucous membranes are moist.  Neck: No stridor.  Cardiovascular: Normal rate, regular rhythm. Good peripheral circulation. Grossly normal heart sounds.   Respiratory: Normal respiratory effort.  No retractions. Lungs CTAB. Gastrointestinal: Soft with right abdominal and flank tenderness. No rebound or guarding. No distention.  Genitourinary: No BR in the vaginal vault. Normal external genitalia. Mild-moderate discharge noted. No CMT or adnexal tenderness.  Musculoskeletal: No lower extremity tenderness nor edema. No gross deformities of extremities. Neurologic:  Normal speech and language. No gross focal neurologic deficits are appreciated.  Skin:  Skin is warm, dry and intact. No rash noted.  ____________________________________________   LABS (all labs ordered are listed, but only abnormal results are displayed)  Labs Reviewed  WET PREP, GENITAL - Abnormal; Notable for the following:       Result Value   Trich, Wet Prep PRESENT (*)    Clue Cells Wet Prep HPF POC PRESENT (*)    WBC, Wet Prep HPF POC FEW (*)    All other components within normal limits  BASIC METABOLIC PANEL - Abnormal; Notable for the following:    Glucose, Bld 263 (*)    All other components within normal limits  URINALYSIS, ROUTINE W REFLEX MICROSCOPIC - Abnormal; Notable for the following:    Specific Gravity, Urine 1.031 (*)    Glucose, UA 150 (*)    Leukocytes, UA SMALL (*)    Bacteria, UA FEW (*)    Squamous Epithelial / LPF 0-5 (*)    All other components within normal limits  CBC  I-STAT TROPONIN, ED  I-STAT BETA HCG  BLOOD, ED ([facility], [facility], [facility] ONLY)  GC/CHLAMYDIA PROBE AMP ([place]) NOT AT [facility]   ____________________________________________  EKG   EKG Interpretation  Date/Time:  Wednesday November 18 2016 13:34:27 EDT Ventricular Rate:  85 PR Interval:  134 QRS Duration: 82 QT Interval:  366 QTC Calculation: 435 R Axis:   69 Text Interpretation:  Normal sinus rhythm T wave abnormality, consider inferolateral ischemia Abnormal ECG No STEMI.  Confirmed by Nanda Quinton [phone]) on 11/18/2016 5:53:20 PM       ____________________________________________  RADIOLOGY  Dg Chest 2 View  Result Date: 11/18/2016 CLINICAL DATA:  Chest pain.  Shortness of breath for 5 days. EXAM: CHEST  2 VIEW COMPARISON:  None. FINDINGS: Heart size is normal. There is no edema or effusion. No focal airspace disease is present. The visualized soft tissues and bony thorax are unremarkable. IMPRESSION: Negative two view chest x-ray Electronically Signed   By: Wynetta Fines.D.  On: 11/18/2016 14:47   Ct Renal Stone Study  Result Date: 11/18/2016 CLINICAL DATA:  Right-sided flank pain EXAM: CT ABDOMEN AND PELVIS WITHOUT CONTRAST TECHNIQUE: Multidetector CT imaging of the abdomen and pelvis was performed following the standard protocol without IV contrast. COMPARISON:  11/14/2009 FINDINGS: Lower chest: No acute abnormality. Hepatobiliary: No focal liver abnormality is seen. No gallstones, gallbladder wall thickening, or biliary dilatation. Pancreas: Unremarkable. No pancreatic ductal dilatation or surrounding inflammatory changes. Spleen: Normal in size without focal abnormality. Adrenals/Urinary Tract: Adrenal glands are unremarkable. Kidneys are normal, without renal calculi, focal lesion, or hydronephrosis. Bladder is unremarkable. Stomach/Bowel: Diverticular change of the colon is noted. Some inflammatory changes are noted surrounding the sigmoid colon without evidence of perforation or abscess formation consistent with  early diverticulitis. The appendix is within normal limits. No obstructive changes are seen. Vascular/Lymphatic: No significant vascular findings are present. No enlarged abdominal or pelvic lymph nodes. Reproductive: The uterus is bulky consistent with the known history of uterine fibroids. Other: No abdominal wall hernia or abnormality. No abdominopelvic ascites. Musculoskeletal: Mild degenerative changes of the lumbar spine are noted. IMPRESSION: Changes of early sigmoid diverticulitis. No abscess or perforation is identified. No other acute abnormality is noted. Chronic changes as described above. Electronically Signed   By: Inez Catalina M.D.   On: 11/18/2016 18:30    ____________________________________________   PROCEDURES  Procedure(s) performed:   Procedures  None ____________________________________________   INITIAL IMPRESSION / ASSESSMENT AND PLAN / ED COURSE  Pertinent labs & imaging results that were available during my care of the patient were reviewed by me and considered in my medical decision making (see chart for details).  Patient presents emergency department for evaluation of multiple medical complaints which began simultaneously including chest pain, nausea/vomiting, epigastric abdominal pain, right flank pain, vaginal spotting.  Patient is very well-appearing and abdominal exam is nonfocal.  She does seem to have some more discomfort in the right abdomen and flank.  EKG shows T wave inversions which seems somewhat new since prior but seem more consistent with LVH. CP is intermittent and ongoing for the last 3 days. Plan for Zofran, pelvic exam, and CT abdomen/pelvis to evaluate for renal stone.   Imaging remarkable for early diverticulitis. Patient tolerating PO abx. Pelvic exam wet prep with positive for trichomoniasis. Treated with additional flagyl and also treated for Gonorrhea and Chlamydia. No evidence of TOA or PID on exam. Patient requesting short duration refill  for home meds which I have provided but encouraged her to f/u with PCP.   At this time, I do not feel there is any life-threatening condition present. I have reviewed and discussed all results (EKG, imaging, lab, urine as appropriate), exam findings with patient. I have reviewed nursing notes and appropriate previous records.  I feel the patient is safe to be discharged home without further emergent workup. Discussed usual and customary return precautions. Patient and family (if present) verbalize understanding and are comfortable with this plan.  Patient will follow-up with their primary care provider. If they do not have a primary care provider, information for follow-up has been provided to them. All questions have been answered.  ____________________________________________  FINAL CLINICAL IMPRESSION(S) / ED DIAGNOSES  Final diagnoses:  Generalized abdominal pain  Non-intractable vomiting with nausea, unspecified vomiting type  Precordial chest pain  Trichomonal vaginitis  Diverticulitis     MEDICATIONS GIVEN DURING THIS VISIT:  Medications  ondansetron (ZOFRAN-ODT) disintegrating tablet 4 mg (4 mg Oral Given 11/18/16 1853)  metroNIDAZOLE (  FLAGYL) tablet 500 mg (500 mg Oral Given 11/18/16 1920)  ciprofloxacin (CIPRO) tablet 500 mg (500 mg Oral Given 11/18/16 1920)  metroNIDAZOLE (FLAGYL) tablet 1,500 mg (1,500 mg Oral Given 11/18/16 2008)  azithromycin (ZITHROMAX) tablet 1,000 mg (1,000 mg Oral Given 11/18/16 2009)  cefTRIAXone (ROCEPHIN) injection 250 mg (250 mg Intramuscular Given 11/18/16 2011)  lidocaine (PF) (XYLOCAINE) 1 % injection 5 mL (5 mLs Other Given 11/18/16 2012)     NEW OUTPATIENT MEDICATIONS STARTED DURING THIS VISIT:  Discharge Medication List as of 11/18/2016  7:58 PM    START taking these medications   Details  ciprofloxacin (CIPRO) 500 MG tablet Take 1 tablet (500 mg total) by mouth 2 (two) times daily., Starting Wed 11/18/2016, Print    metroNIDAZOLE  (FLAGYL) 500 MG tablet Take 1 tablet (500 mg total) by mouth 2 (two) times daily., Starting Wed 11/18/2016, Print    sucralfate (CARAFATE) 1 g tablet Take 1 tablet (1 g total) by mouth 4 (four) times daily -  with meals and at bedtime., Starting Wed 11/18/2016, Until Fri 12/18/2016, Print        Note:  This document was prepared using Dragon voice recognition software and may include unintentional dictation errors.  Nanda Quinton, MD Emergency Medicine    Long, [facility], MD 11/18/16 [phone]

## 2016-11-18 NOTE — ED Notes (Signed)
Patient transported to CT 

## 2016-11-18 NOTE — Discharge Instructions (Signed)
We believe your symptoms are caused by diverticulitis.  Most of the time this condition (please read through the included information) can be cured with outpatient antibiotics.  Please take the full course of prescribed medication(s) and follow up with the doctors recommended above. ° °Return to the ED if your abdominal pain worsens or fails to improve, you develop bloody vomiting, bloody diarrhea, you are unable to tolerate fluids due to vomiting, fever greater than 101, or other symptoms that concern you. °  °Diverticulitis °Diverticulitis is inflammation or infection of small pouches in your colon that form when you have a condition called diverticulosis. The pouches in your colon are called diverticula. Your colon, or large intestine, is where water is absorbed and stool is formed. °Complications of diverticulitis can include: °Bleeding. °Severe infection. °Severe pain. °Perforation of your colon. °Obstruction of your colon. °CAUSES  °Diverticulitis is caused by bacteria. °Diverticulitis happens when stool becomes trapped in diverticula. This allows bacteria to grow in the diverticula, which can lead to inflammation and infection. °RISK FACTORS °People with diverticulosis are at risk for diverticulitis. Eating a diet that does not include enough fiber from fruits and vegetables may make diverticulitis more likely to develop. °SYMPTOMS  °Symptoms of diverticulitis may include: °Abdominal pain and tenderness. The pain is normally located on the left side of the abdomen, but may occur in other areas. °Fever and chills. °Bloating. °Cramping. °Nausea. °Vomiting. °Constipation. °Diarrhea. °Blood in your stool. °DIAGNOSIS  °Your health care provider will ask you about your medical history and do a physical exam. You may need to have tests done because many medical conditions can cause the same symptoms as diverticulitis. Tests may include: °Blood tests. °Urine tests. °Imaging tests of the abdomen, including X-rays and  CT scans. °When your condition is under control, your health care provider may recommend that you have a colonoscopy. A colonoscopy can show how severe your diverticula are and whether something else is causing your symptoms. °TREATMENT  °Most cases of diverticulitis are mild and can be treated at home. Treatment may include: °Taking over-the-counter pain medicines. °Following a clear liquid diet. °Taking antibiotic medicines by mouth for 7-10 days. °More severe cases may be treated at a hospital. Treatment may include: °Not eating or drinking. °Taking prescription pain medicine. °Receiving antibiotic medicines through an IV tube. °Receiving fluids and nutrition through an IV tube. °Surgery. °HOME CARE INSTRUCTIONS  °Follow your health care provider's instructions carefully. °Follow a full liquid diet or other diet as directed by your health care provider. After your symptoms improve, your health care provider may tell you to change your diet. He or she may recommend you eat a high-fiber diet. Fruits and vegetables are good sources of fiber. Fiber makes it easier to pass stool. °Take fiber supplements or probiotics as directed by your health care provider. °Only take medicines as directed by your health care provider. °Keep all your follow-up appointments. °SEEK MEDICAL CARE IF:  °Your pain does not improve. °You have a hard time eating food. °Your bowel movements do not return to normal. °SEEK IMMEDIATE MEDICAL CARE IF:  °Your pain becomes worse. °Your symptoms do not get better. °Your symptoms suddenly get worse. °You have a fever. °You have repeated vomiting. °You have bloody or black, tarry stools. °MAKE SURE YOU:  °Understand these instructions. °Will watch your condition. °Will get help right away if you are not doing well or get worse. °Document Released: 10/22/2004 Document Revised: 01/17/2013 Document Reviewed: 12/07/2012 °Exit[employer]® Patient Information ©2015 [employer].   This information is not intended  to replace advice given to you by your health care provider. Make sure you discuss any questions you have with your health care provider. ° ° °

## 2016-11-18 NOTE — ED Triage Notes (Signed)
Pt presents with multiple complaints.  Emesis x 20 since Sat, chest pain, blood on tp when she wipes, back and abdominal pain.  Pt has not had meds x 1 month.

## 2016-11-19 LAB — GC/CHLAMYDIA PROBE AMP (~~LOC~~) NOT AT ARMC
CHLAMYDIA, DNA PROBE: NEGATIVE
Neisseria Gonorrhea: NEGATIVE

## 2017-08-11 ENCOUNTER — Telehealth: Payer: Self-pay | Admitting: Cardiovascular Disease

## 2017-08-11 DIAGNOSIS — R002 Palpitations: Secondary | ICD-10-CM

## 2017-08-11 NOTE — Telephone Encounter (Signed)
Patient c/o Palpitations:  High priority if patient c/o lightheadedness, shortness of breath, or chest pain  1) How long have you had palpitations/irregular HR/ Afib? Are you having the symptoms now? Been quite awhile  2) Are you currently experiencing lightheadedness, SOB or CP? Only when heart racing-feels sob and dizzinessi  3) Do you have a history of afib (atrial fibrillation) or irregular heart rhythm? Yes, she thinks so 4) Have you checked your BP or HR? (document readings if available): no 5) Are you experiencing any other symptoms? no

## 2017-08-11 NOTE — Telephone Encounter (Signed)
Left message for patient to call back  

## 2017-08-17 NOTE — Telephone Encounter (Signed)
Spoke with pt, she reports having a fast heart rate 3 to 4 times daily. It comes on suddenly and will last about 3 to 4 min and go away. She will get the dizziness and SOB with the palpitations only. She has a follow up appointment scheduled for 09-02-17 with extender. Will forward to dr croitoru to see if monitor prior to appointment is okay.

## 2017-08-17 NOTE — Telephone Encounter (Signed)
Spoke with pt, monitor scheduled for 08-24-17 @ 2 pm.

## 2017-08-17 NOTE — Telephone Encounter (Signed)
It would be great if we could get a 48-hour Holter monitor done before her follow-up appointment MCr

## 2017-08-18 ENCOUNTER — Ambulatory Visit (INDEPENDENT_AMBULATORY_CARE_PROVIDER_SITE_OTHER): Payer: Medicare Other

## 2017-08-18 ENCOUNTER — Telehealth: Payer: Self-pay

## 2017-08-18 DIAGNOSIS — R002 Palpitations: Secondary | ICD-10-CM

## 2017-08-18 NOTE — Telephone Encounter (Signed)
Pt walked in before her Orthopaedic appt to inform us that she is still having palpitations but not currently having them right them. She says she is having some dizziness and fatigue with them. I arranged for her to have her 48 hour Holter monitor to be put on today I also advised her that if they get worse or she feels like passing out to call EMS and pt verbalized understanding.

## 2017-08-24 ENCOUNTER — Telehealth: Payer: Self-pay | Admitting: *Deleted

## 2017-08-24 NOTE — Telephone Encounter (Signed)
Patient walked in today wanting to get an event monitor. She just turned in her 48 hour monitor today and had symptoms while she was wearing. Explained to her that insurance would not likely cover at this point and Dr Sallyanne Kuster would need to review monitor results before ordering anything further.  She will keep appointment as scheduled

## 2017-08-25 NOTE — Telephone Encounter (Signed)
Thanks for the info.

## 2017-09-01 ENCOUNTER — Emergency Department (HOSPITAL_COMMUNITY): Payer: Medicare Other

## 2017-09-01 ENCOUNTER — Emergency Department (HOSPITAL_COMMUNITY)
Admission: EM | Admit: 2017-09-01 | Discharge: 2017-09-01 | Disposition: A | Discharge status: Home / Self Care | Payer: Medicare Other | Attending: Emergency Medicine | Admitting: Emergency Medicine

## 2017-09-01 ENCOUNTER — Encounter (HOSPITAL_COMMUNITY): Payer: Self-pay | Admitting: Emergency Medicine

## 2017-09-01 DIAGNOSIS — Z79899 Other long term (current) drug therapy: Secondary | ICD-10-CM | POA: Diagnosis not present

## 2017-09-01 DIAGNOSIS — R0602 Shortness of breath: Secondary | ICD-10-CM

## 2017-09-01 DIAGNOSIS — Z87891 Personal history of nicotine dependence: Secondary | ICD-10-CM | POA: Diagnosis not present

## 2017-09-01 DIAGNOSIS — R062 Wheezing: Secondary | ICD-10-CM | POA: Diagnosis not present

## 2017-09-01 DIAGNOSIS — E119 Type 2 diabetes mellitus without complications: Secondary | ICD-10-CM | POA: Insufficient documentation

## 2017-09-01 DIAGNOSIS — Z7984 Long term (current) use of oral hypoglycemic drugs: Secondary | ICD-10-CM | POA: Insufficient documentation

## 2017-09-01 DIAGNOSIS — Z7982 Long term (current) use of aspirin: Secondary | ICD-10-CM | POA: Insufficient documentation

## 2017-09-01 DIAGNOSIS — I10 Essential (primary) hypertension: Secondary | ICD-10-CM | POA: Insufficient documentation

## 2017-09-01 LAB — CBC WITH DIFFERENTIAL/PLATELET
Abs Immature Granulocytes: 0 10*3/uL (ref 0.0–0.1)
Basophils Absolute: 0 10*3/uL (ref 0.0–0.1)
Basophils Relative: 0 %
EOS PCT: 2 %
Eosinophils Absolute: 0.2 10*3/uL (ref 0.0–0.7)
HEMATOCRIT: 42.2 % (ref 36.0–46.0)
HEMOGLOBIN: 13.2 g/dL (ref 12.0–15.0)
Immature Granulocytes: 0 %
LYMPHS ABS: 3 10*3/uL (ref 0.7–4.0)
LYMPHS PCT: 39 %
MCH: 26.9 pg (ref 26.0–34.0)
MCHC: 31.3 g/dL (ref 30.0–36.0)
MCV: 86.1 fL (ref 78.0–100.0)
MONO ABS: 0.5 10*3/uL (ref 0.1–1.0)
Monocytes Relative: 6 %
Neutro Abs: 4 10*3/uL (ref 1.7–7.7)
Neutrophils Relative %: 53 %
Platelets: 234 10*3/uL (ref 150–400)
RBC: 4.9 MIL/uL (ref 3.87–5.11)
RDW: 13.9 % (ref 11.5–15.5)
WBC: 7.6 10*3/uL (ref 4.0–10.5)

## 2017-09-01 LAB — COMPREHENSIVE METABOLIC PANEL
ALK PHOS: 91 U/L (ref 38–126)
ALT: 22 U/L (ref 0–44)
ANION GAP: 13 (ref 5–15)
AST: 19 U/L (ref 15–41)
Albumin: 3.7 g/dL (ref 3.5–5.0)
BILIRUBIN TOTAL: 0.6 mg/dL (ref 0.3–1.2)
BUN: 14 mg/dL (ref 6–20)
CALCIUM: 9 mg/dL (ref 8.9–10.3)
CO2: 19 mmol/L — ABNORMAL LOW (ref 22–32)
CREATININE: 1.02 mg/dL — AB (ref 0.44–1.00)
Chloride: 106 mmol/L (ref 98–111)
GFR calc Af Amer: >60 mL/min (ref >60)
GFR calc non Af Amer: >60 mL/min (ref >60)
Glucose, Bld: 236 mg/dL — ABNORMAL HIGH (ref 70–99)
Potassium: 3.9 mmol/L (ref 3.5–5.1)
Sodium: 138 mmol/L (ref 135–145)
TOTAL PROTEIN: 6.6 g/dL (ref 6.5–8.1)

## 2017-09-01 LAB — I-STAT TROPONIN, ED: Troponin i, poc: 0 ng/mL (ref 0.00–0.08)

## 2017-09-01 LAB — BRAIN NATRIURETIC PEPTIDE: B Natriuretic Peptide: 35.3 pg/mL (ref 0.0–100.0)

## 2017-09-01 MED ORDER — METOPROLOL SUCCINATE ER 100 MG PO TB24: 100.0000 mg | ORAL_TABLET | Freq: Every day | ORAL | 0 refills | Status: DC

## 2017-09-01 MED ORDER — IPRATROPIUM-ALBUTEROL 0.5-2.5 (3) MG/3ML IN SOLN
3.0000 mL | Freq: Once | RESPIRATORY_TRACT | Status: AC
  Administered 2017-09-01: 3 mL via RESPIRATORY_TRACT
  Filled 2017-09-01: qty 3

## 2017-09-01 MED ORDER — METHYLPREDNISOLONE SODIUM SUCC 125 MG IJ SOLR
125.0000 mg | Freq: Once | INTRAMUSCULAR | Status: AC
  Administered 2017-09-01: 125 mg via INTRAVENOUS
  Filled 2017-09-01: qty 2

## 2017-09-01 MED ORDER — ALBUTEROL SULFATE (5 MG/ML) 0.5% IN NEBU: 2.5000 mg | INHALATION_SOLUTION | Freq: Four times a day (QID) | RESPIRATORY_TRACT | 0 refills | Status: DC | PRN

## 2017-09-01 MED ORDER — ALBUTEROL SULFATE HFA 108 (90 BASE) MCG/ACT IN AERS
1.0000 | INHALATION_SPRAY | Freq: Once | RESPIRATORY_TRACT | Status: DC
Start: 2017-09-01 — End: 2017-09-01
  Filled 2017-09-01: qty 6.7

## 2017-09-01 MED ORDER — METFORMIN HCL 500 MG PO TABS: 1000.0000 mg | ORAL_TABLET | Freq: Two times a day (BID) | ORAL | 0 refills | Status: DC

## 2017-09-01 MED ORDER — ALBUTEROL SULFATE (2.5 MG/3ML) 0.083% IN NEBU
5.0000 mg | INHALATION_SOLUTION | Freq: Once | RESPIRATORY_TRACT | Status: AC
  Administered 2017-09-01: 5 mg via RESPIRATORY_TRACT
  Filled 2017-09-01: qty 6

## 2017-09-01 NOTE — Discharge Instructions (Addendum)
Use the albuterol inhaler as prescribed as needed for shortness of breath.  Continue to take your home medicines including your metoprolol and metformin as prescribed.  Drink plenty water and get plenty of rest.  Follow-up with Dr. Sallyanne Kuster tomorrow as scheduled to discuss your work-up today and your recent palpitations.  Follow-up with your PCP Dr. Harrington Challenger for reevaluation of your symptoms and medication management.  Return to the emergency department if any concerning signs or symptoms develop such as worsening shortness of breath or chest pain especially with use of your albuterol inhaler, lightheadedness, fevers, or passing out.  If your blood pressure (BP) was elevated on multiple readings during this visit above 130 for the top number or above 80 for the bottom number, please have this repeated by your primary care provider within one month. You can also check your blood pressure when you are out at a pharmacy or grocery store. Many have machines that will check your blood pressure.  If your blood pressure remains elevated, please follow-up with your PCP.

## 2017-09-01 NOTE — ED Provider Notes (Signed)
[facility] EMERGENCY DEPARTMENT Provider Note   CSN: [id number] Arrival date & time: 09/01/17  0035     History   Chief Complaint Chief Complaint  Patient presents with  . Shortness of Breath    HPI Rebecca Malone is a 52 y.o. female with history of asthma, hypertension, obesity, type 2 diabetes mellitus, OSA, degenerative arthritis, GERD presents today for evaluation of acute onset, progressively worsening shortness of breath since May.  She states that she moved to a new home in May which has very poor air circulation and has had worsening shortness of breath since then.  Shortness of breath primarily occurs at night.  She states she will wake up in the middle the night short of breath feeling as though there is a bowling ball on her chest.  She also notes intermittent chest tightness for the past several months.  Tightness is left-sided, radiates to the left upper extremity causing pins-and-needles sensation.  Also describes it as a squeezing pain.  She states this can occur at rest or at night but is not exertional in nature or pleuritic.  She does endorse orthopnea and states she has to sleep on 6 pillows at night.  Her symptoms improve when she gets fresh air outside.  She has been using an expired pro-air inhaler which has been helpful at times but is now out of this.  She has been out of all of her home medicines for 1 month including her blood pressure medicines.  She also notes palpitations and lightheadedness intermittently which is being evaluated by her cardiologist Dr. Sallyanne Kuster.  She has undergone 48-hour Holter monitoring.  She has an appointment to see him tomorrow.  She is a current smoker of a third of a pack of cigarettes daily, drinks alcohol occasionally.  Denies recreational drug use or excessive caffeine intake.  Denies fevers, chills, abdominal pain, nausea, vomiting, diaphoresis, or syncope.   The history is provided by the patient.    Past Medical  History:  Diagnosis Date  . Asthma   . Chest pain march 2014   admitted to hospital   . Degenerative arthritis    "knees" (03/07/2015)  . GERD (gastroesophageal reflux disease)   . History of blood transfusion 10/2009   "related to losing too much blood w/my period"  . Hypertension   . MI (myocardial infarction) [facility]) March 2014  . Obesity   . OSA on CPAP   . Pneumonia 2008  . Tachycardia march 2014   admitted to hospital  . Type II diabetes mellitus [facility])     Patient Active Problem List   Diagnosis Date Noted  . Influenza A 03/07/2015  . HTN (hypertension) 06/09/2013  . Palpitations 06/07/2012  . Smoker 06/07/2012  . Acute asthma exacerbation 04/08/2012  . CAP (community acquired pneumonia) 04/08/2012  . SOB (shortness of breath) 04/08/2012  . Diabetes mellitus ([place]) 09/11/2011  . Degenerative arthritis of knee 09/11/2011  . Neuropathy due to secondary diabetes ([place]) 09/11/2011  . Chest pain on exertion 09/09/2011  . Edema extremities 09/09/2011  . Anemia 09/09/2011  . OBESITY, MORBID 06/28/2008  . Asthma 06/28/2008  . BACK PAIN, LUMBAR 06/28/2008    Past Surgical History:  Procedure Laterality Date  . CESAREAN SECTION  1999     OB History   None      Home Medications    Prior to Admission medications   Medication Sig Start Date End Date Taking? Authorizing Provider  aspirin EC 81 MG EC  tablet Take 1 tablet (81 mg total) by mouth daily. 04/09/12  Yes Laza, Sorin C, MD  acetaminophen (TYLENOL) 325 MG tablet Take 2 tablets (650 mg total) by mouth every 6 (six) hours as needed for mild pain or moderate pain (or Fever >/= 101). Patient not taking: Reported on 09/01/2017 03/08/15   Jule Ser, DO  albuterol (PROVENTIL HFA;VENTOLIN HFA) 108 [phone] Base) MCG/ACT inhaler Inhale 2 puffs into the lungs every 6 (six) hours as needed for shortness of breath. For asthma Patient not taking: Reported on 09/01/2017 11/18/16   Long, Wonda Olds, MD  albuterol (PROVENTIL) (5 MG/ML)  0.5% nebulizer solution Take 0.5 mLs (2.5 mg total) by nebulization every 6 (six) hours as needed for wheezing or shortness of breath. 09/01/17   Rutherford Alarie A, PA-C  Blood Pressure Monitoring (BLOOD PRESSURE MONITOR AUTOMAT) DEVI 1 mL by Does not apply route daily. 06/12/13   Croitoru, Mihai, MD  ciprofloxacin (CIPRO) 500 MG tablet Take 1 tablet (500 mg total) by mouth 2 (two) times daily. Patient not taking: Reported on 09/01/2017 11/18/16   Margette Fast, MD  metFORMIN (GLUCOPHAGE) 500 MG tablet Take 2 tablets (1,000 mg total) by mouth 2 (two) times daily with a meal. 09/01/17 10/01/17  Nils Flack, Lamarr Feenstra A, PA-C  metoprolol succinate (TOPROL-XL) 100 MG 24 hr tablet Take 1 tablet (100 mg total) by mouth daily. Take with or immediately following a meal. 09/01/17 10/01/17  Inas Avena A, PA-C  metroNIDAZOLE (FLAGYL) 500 MG tablet Take 1 tablet (500 mg total) by mouth 2 (two) times daily. Patient not taking: Reported on 09/01/2017 11/18/16   Long, Wonda Olds, MD  omeprazole (PRILOSEC) 20 MG capsule Take 1 capsule (20 mg total) by mouth 2 (two) times daily. Patient not taking: Reported on 09/01/2017 11/16/13   Tanna Furry, MD  ondansetron (ZOFRAN ODT) 4 MG disintegrating tablet Take 1 tablet (4 mg total) by mouth every 8 (eight) hours as needed for nausea or vomiting. Patient not taking: Reported on 09/01/2017 11/18/16   Long, Wonda Olds, MD  oseltamivir (TAMIFLU) 75 MG capsule Take 1 capsule (75 mg total) by mouth 2 (two) times daily. Patient not taking: Reported on 11/18/2016 03/08/15   Jule Ser, DO  predniSONE (DELTASONE) 20 MG tablet Take 2 tablets (40 mg total) by mouth daily with breakfast. For the next four days Patient not taking: Reported on 09/01/2017 02/04/16   Carmin Muskrat, MD  sucralfate (CARAFATE) 1 g tablet Take 1 tablet (1 g total) by mouth 4 (four) times daily -  with meals and at bedtime. Patient not taking: Reported on 09/01/2017 11/18/16 09/01/25  Long, Wonda Olds, MD    Family History No family history  on file.  Social History Social History   Tobacco Use  . Smoking status: Former Smoker    Packs/day: 0.12    Years: 20.00    Pack years: 2.40    Types: Cigarettes    Last attempt to quit: 02/10/2015    Years since quitting: 2.5  . Smokeless tobacco: Never Used  Substance Use Topics  . Alcohol use: Yes    Comment: 03/07/2015 "might have 4 mixed drinks/month"  . Drug use: No     Allergies   Advair diskus [fluticasone-salmeterol]; Other; and Pork-derived products   Review of Systems Review of Systems  Constitutional: Positive for fatigue. Negative for chills and fever.  Respiratory: Positive for chest tightness and shortness of breath.   Cardiovascular: Positive for chest pain and palpitations.  Gastrointestinal: Negative for abdominal  pain, nausea and vomiting.  Neurological: Positive for light-headedness. Negative for syncope.  All other systems reviewed and are negative.    Physical Exam Updated Vital Signs BP (!) 182/85   Pulse 78   Temp 98.8 F (37.1 C) (Oral)   Resp (!) 25   Ht 5\' 5"  (1.651 m)   Wt 103.4 kg (228 lb)   LMP 09/30/2012   SpO2 96%   BMI 37.94 kg/m   Physical Exam  Constitutional: She appears well-developed and well-nourished. No distress.  HENT:  Head: Normocephalic and atraumatic.  Eyes: Conjunctivae are normal. Right eye exhibits no discharge. Left eye exhibits no discharge.  Neck: Normal range of motion. Neck supple. No JVD present. No tracheal deviation present.  Cardiovascular: Normal rate, regular rhythm and normal heart sounds.  Pulmonary/Chest: Effort normal. No accessory muscle usage. No tachypnea. No respiratory distress. She has wheezes. She has rales in the right lower field and the left lower field. She exhibits no tenderness.  Diffuse is between expiratory wheezing, superimposed bibasilar crackles.  No increased work of breathing.  Speaking in full sentences without difficulty.  Abdominal: Soft. Bowel sounds are normal. She  exhibits no distension. There is no tenderness.  Musculoskeletal: She exhibits no edema.       Right lower leg: Normal. She exhibits no tenderness and no edema.       Left lower leg: Normal. She exhibits no tenderness and no edema.  Neurological: She is alert.  Skin: Skin is warm and dry. No erythema.  Psychiatric: She has a normal mood and affect. Her behavior is normal.  Nursing note and vitals reviewed.    ED Treatments / Results  Labs (all labs ordered are listed, but only abnormal results are displayed) Labs Reviewed  COMPREHENSIVE METABOLIC PANEL - Abnormal; Notable for the following components:      Result Value   CO2 19 (*)    Glucose, Bld 236 (*)    Creatinine, Ser 1.02 (*)    All other components within normal limits  CBC WITH DIFFERENTIAL/PLATELET  BRAIN NATRIURETIC PEPTIDE  I-STAT TROPONIN, ED    EKG EKG Interpretation  Date/Time:  Wednesday September 01 2017 00:47:55 EDT Ventricular Rate:  77 PR Interval:  118 QRS Duration: 76 QT Interval:  380 QTC Calculation: 430 R Axis:   33 Text Interpretation:  Normal sinus rhythm T wave abnormality, consider lateral ischemia Abnormal ECG Unchanged Confirmed by Thayer Jew [phone]) on 09/01/2017 6:52:34 AM   Radiology Dg Chest 2 View  Result Date: 09/01/2017 CLINICAL DATA:  Acute onset of shortness of breath and chest congestion. EXAM: CHEST - 2 VIEW COMPARISON:  Chest radiograph performed 11/18/2016 FINDINGS: The lungs are well-aerated. Vascular congestion is noted. Peribronchial thickening is seen. There is no evidence of focal opacification, pleural effusion or pneumothorax. The heart is borderline normal in size. No acute osseous abnormalities are seen. IMPRESSION: Vascular congestion noted; peribronchial thickening seen. Lungs otherwise grossly clear. Electronically Signed   By: Garald Balding M.D.   On: 09/01/2017 01:31    Procedures Procedures (including critical care time)  Medications Ordered in ED Medications   albuterol (PROVENTIL HFA;VENTOLIN HFA) 108 (90 Base) MCG/ACT inhaler 1 puff (has no administration in time range)  albuterol (PROVENTIL) (2.5 MG/3ML) 0.083% nebulizer solution 5 mg (5 mg Nebulization Given 09/01/17 0050)  ipratropium-albuterol (DUONEB) 0.5-2.5 (3) MG/3ML nebulizer solution 3 mL (3 mLs Nebulization Given 09/01/17 0726)  methylPREDNISolone sodium succinate (SOLU-MEDROL) 125 mg/2 mL injection 125 mg (125 mg Intravenous Given 09/01/17 0724)  Initial Impression / Assessment and Plan / ED Course  I have reviewed the triage vital signs and the nursing notes.  Pertinent labs & imaging results that were available during my care of the patient were reviewed by me and considered in my medical decision making (see chart for details).     Patient presents for evaluation of intermittent shortness of breath and chest tightness.  She is afebrile, hypertensive while in the ED but has been out of her hypertension medicines for 1 month.  She is nontoxic in appearance.  No increased work of breathing on examination.  Diffuse wheezing on auscultation of the lungs.  Lab work reviewed by me shows no leukocytosis, no anemia.  She is hyperglycemic with glucose of 236.  Very mild elevation in creatinine of 1.02, though not consistent with AKI.  No evidence of DKA, normal anion gap.  Chest x-ray shows vascular congestion, peribronchial thickening, no evidence of edema or consolidation otherwise.  BNP is within normal limits and she has no lower extremity edema.  Last echo was in 2014 with preserved EF.  Low suspicion of fulminant CHF and when patient was ambulated SPO2 saturations remained stable.  She had no increased work of breathing with ambulation.  She did receive a breathing treatment and 1 dose of site Medrol in the ED with improvement in her wheezing.  Patient states she feels much better.  Troponin is negative, EKG shows no acute ischemic changes.  I doubt ACS/MI.  Doubt PE.  Suspect asthma exacerbation  secondary to irritants at home.  We will refill the patient's albuterol inhaler and her home medicines.  Will hold on prednisone burst due to her currently unmedicated diabetes mellitus.  She has an appointment with her cardiologist tomorrow and will discuss her work-up today with him at that time.  It is reasonable for cardiology to set up an echo on an outpatient basis though I see no indication for admission to the hospital at this time.  Also recommend follow-up with her PCP Dr. Harrington Challenger with [facility] for medication management and refills.  Discussed strict ED return precautions. Pt verbalized understanding of and agreement with plan and is safe for discharge home at this time.   Final Clinical Impressions(s) / ED Diagnoses   Final diagnoses:  Shortness of breath  Wheezing    ED Discharge Orders        Ordered    metFORMIN (GLUCOPHAGE) 500 MG tablet  2 times daily with meals     09/01/17 0853    metoprolol succinate (TOPROL-XL) 100 MG 24 hr tablet  Daily     09/01/17 0853    albuterol (PROVENTIL) (5 MG/ML) 0.5% nebulizer solution  Every 6 hours PRN     09/01/17 0859       Renita Papa, PA-C 09/01/17 0920    Merryl Hacker, MD 09/03/17 [phone]

## 2017-09-01 NOTE — ED Notes (Signed)
Pt ambulated in the hallway, O2 stayed between 96-99%, dropped to 95 once. Pt was talking to me while ambulating.

## 2017-09-01 NOTE — ED Triage Notes (Signed)
Patient reports SOB for several days worse this evening with chest congestion , denies fever or chills .

## 2017-09-02 ENCOUNTER — Ambulatory Visit (INDEPENDENT_AMBULATORY_CARE_PROVIDER_SITE_OTHER): Payer: Medicare Other | Admitting: Cardiology

## 2017-09-02 ENCOUNTER — Encounter: Payer: Self-pay | Admitting: Cardiology

## 2017-09-02 VITALS — BP 160/84 | HR 69 | Ht 65.0 in | Wt 226.4 lb

## 2017-09-02 DIAGNOSIS — R9431 Abnormal electrocardiogram [ECG] [EKG]: Secondary | ICD-10-CM | POA: Diagnosis not present

## 2017-09-02 DIAGNOSIS — I110 Hypertensive heart disease with heart failure: Secondary | ICD-10-CM

## 2017-09-02 DIAGNOSIS — I5031 Acute diastolic (congestive) heart failure: Secondary | ICD-10-CM | POA: Diagnosis not present

## 2017-09-02 DIAGNOSIS — I10 Essential (primary) hypertension: Secondary | ICD-10-CM | POA: Diagnosis not present

## 2017-09-02 DIAGNOSIS — R002 Palpitations: Secondary | ICD-10-CM

## 2017-09-02 DIAGNOSIS — I119 Hypertensive heart disease without heart failure: Secondary | ICD-10-CM | POA: Insufficient documentation

## 2017-09-02 DIAGNOSIS — E119 Type 2 diabetes mellitus without complications: Secondary | ICD-10-CM

## 2017-09-02 MED ORDER — LISINOPRIL-HYDROCHLOROTHIAZIDE 10-12.5 MG PO TABS: 1.0000 | ORAL_TABLET | Freq: Every day | ORAL | 3 refills | Status: DC

## 2017-09-02 NOTE — Assessment & Plan Note (Signed)
Chronic complaint- no serious arrhythmia has been documented.  Pt requests a 30 day monitor

## 2017-09-02 NOTE — Addendum Note (Signed)
Addended by: Ulice Brilliant T on: 09/02/2017 04:37 PM   Modules accepted: Orders

## 2017-09-02 NOTE — Assessment & Plan Note (Signed)
I suspect she had diastolic CHF yesterday based on her history and CXR.

## 2017-09-02 NOTE — Assessment & Plan Note (Signed)
LVH on EKG 

## 2017-09-02 NOTE — Assessment & Plan Note (Signed)
Glucophage just resumed

## 2017-09-02 NOTE — Patient Instructions (Signed)
Medication Instructions:  Your physician recommends that you continue on your current medications as directed. Please refer to the Current Medication list given to you today.  Labwork: Your physician recommends that you return for lab work in: NEXT WEEK SAME DAY AS TESTING-BMET, TSH  Testing/Procedures: Your physician has requested that you have an echocardiogram. Echocardiography is a painless test that uses sound waves to create images of your heart. It provides your doctor with information about the size and shape of your heart and how well your heart's chambers and valves are working. This procedure takes approximately one hour. There are no restrictions for this procedure.  Your physician has recommended that you wear an 30 DAY event monitor. Event monitors are medical devices that record the heart's electrical activity. Doctors most often [place] these monitors to diagnose arrhythmias. Arrhythmias are problems with the speed or rhythm of the heartbeat. The monitor is a small, portable device. You can wear one while you do your normal daily activities. This is usually used to diagnose what is causing palpitations/syncope (passing out). [place]: Your physician recommends that you schedule a follow-up appointment in: 3 WKS with LUKE KILROY FOR BP CHECK & 6-8 WEEKS with DR CROITORU  Any Other Special Instructions Will Be Listed Below (If Applicable). If you need a refill on your cardiac medications before your next appointment, please call your pharmacy.

## 2017-09-02 NOTE — Assessment & Plan Note (Signed)
B/P by me 063 sytsolic

## 2017-09-02 NOTE — Progress Notes (Signed)
09/02/2017 Rebecca Malone   1965-07-25  [id number]  Primary Physician Lawerance Cruel, MD Primary Cardiologist: Dr Sallyanne Kuster  HPI:  Pleasant 52 y/o AA female with a history of palpitations-"every day for the past 5 years". She says she is disabled because of heart condition. We saw her in 2014. She had a low risk Myoview and unremarkable echocardiogram. Dr Sallyanne Kuster placed her on Diltiazem and according to the records she had improvement in her palpitations. She also had sleep apnea but was never able to follow through with C-pap titration. The last time we saw her was in 2015. She called the office recently for an appointment for palpitations and she had a 48 hour Holter 08/18/17 which showed NSR with rare PACs, PVCs, and one 3 bt PSVT.  Other medical issues include NIDDM, diverticulosis, and asthma.   She presented to the ED yesterday with orthopnea. Her CXR suggested vascular congestion but her BNP was low. She was given IV Lasix in the ED. She also admitted she had been  Out of all her medications except a ASA 81 mg for at least a month. Toprol and Glucophage were resumed by the ED. She was unable to tell me who originally prescribed these medications but she says she is in the process of getting a new PCP. She feels better but still has palpitations. She says she wants another monitor done- a 30 day monitor because she doesn't feel the 48 hour monitor showed enough. She tells me she wants to go back to work and wants to make sure her heart is OK.    Current Outpatient Medications  Medication Sig Dispense Refill  . albuterol (PROVENTIL) (5 MG/ML) 0.5% nebulizer solution Take 0.5 mLs (2.5 mg total) by nebulization every 6 (six) hours as needed for wheezing or shortness of breath. 20 mL 0  . aspirin EC 81 MG EC tablet Take 1 tablet (81 mg total) by mouth daily.    . Blood Pressure Monitoring (BLOOD PRESSURE MONITOR AUTOMAT) DEVI 1 mL by Does not apply route daily. 1 Device 0  . metFORMIN  (GLUCOPHAGE) 500 MG tablet Take 2 tablets (1,000 mg total) by mouth 2 (two) times daily with a meal. 120 tablet 0  . metoprolol succinate (TOPROL-XL) 100 MG 24 hr tablet Take 1 tablet (100 mg total) by mouth daily. Take with or immediately following a meal. 30 tablet 0  . albuterol (PROVENTIL HFA;VENTOLIN HFA) 108 (90 Base) MCG/ACT inhaler Inhale 2 puffs into the lungs every 6 (six) hours as needed for shortness of breath. For asthma (Patient not taking: Reported on 09/02/2017) 6.7 g 1  . omeprazole (PRILOSEC) 20 MG capsule Take 1 capsule (20 mg total) by mouth 2 (two) times daily. (Patient not taking: Reported on 09/01/2017) 60 capsule 1  . ondansetron (ZOFRAN ODT) 4 MG disintegrating tablet Take 1 tablet (4 mg total) by mouth every 8 (eight) hours as needed for nausea or vomiting. (Patient not taking: Reported on 09/01/2017) 20 tablet 0  . sucralfate (CARAFATE) 1 g tablet Take 1 tablet (1 g total) by mouth 4 (four) times daily -  with meals and at bedtime. (Patient not taking: Reported on 09/01/2017) 120 tablet 0   No current facility-administered medications for this visit.     Allergies  Allergen Reactions  . Advair Diskus [Fluticasone-Salmeterol] Other (See Comments)    Made sores on the back of her throat  . Other Hives and Itching    Patient states she is allergic to all  generic medications. Can only have [place]  . Pork-Derived Products     Religious     Past Medical History:  Diagnosis Date  . Asthma   . Chest pain march 2014   admitted to hospital   . Degenerative arthritis    "knees" (03/07/2015)  . GERD (gastroesophageal reflux disease)   . History of blood transfusion 10/2009   "related to losing too much blood w/my period"  . Hypertension   . MI (myocardial infarction) [facility]) March 2014  . Obesity   . OSA on CPAP   . Pneumonia 2008  . Tachycardia march 2014   admitted to hospital  . Type II diabetes mellitus ([place])     Social History   Socioeconomic History    . Marital status: Divorced    Spouse name: Not on file  . Number of children: Not on file  . Years of education: Not on file  . Highest education level: Not on file  Occupational History  . Not on file  Social Needs  . Financial resource strain: Not on file  . Food insecurity:    Worry: Not on file    Inability: Not on file  . Transportation needs:    Medical: Not on file    Non-medical: Not on file  Tobacco Use  . Smoking status: Former Smoker    Packs/day: 0.12    Years: 20.00    Pack years: 2.40    Types: Cigarettes    Last attempt to quit: 02/10/2015    Years since quitting: 2.5  . Smokeless tobacco: Never Used  Substance and Sexual Activity  . Alcohol use: Yes    Comment: 03/07/2015 "might have 4 mixed drinks/month"  . Drug use: No  . Sexual activity: Not Currently  Lifestyle  . Physical activity:    Days per week: Not on file    Minutes per session: Not on file  . Stress: Not on file  Relationships  . Social connections:    Talks on phone: Not on file    Gets together: Not on file    Attends religious service: Not on file    Active member of club or organization: Not on file    Attends meetings of clubs or organizations: Not on file    Relationship status: Not on file  . Intimate partner violence:    Fear of current or ex partner: Not on file    Emotionally abused: Not on file    Physically abused: Not on file    Forced sexual activity: Not on file  Other Topics Concern  . Not on file  Social History Narrative  . Not on file     Review of Systems: General: negative for chills, fever, night sweats -she lost 80 lbs in the last year by changing her diet Cardiovascular: negative for paroxysmal nocturnal dyspnea  Dermatological: negative for rash Respiratory: negative for cough or wheezing Urologic: negative for hematuria Abdominal: negative for nausea, vomiting, diarrhea, bright red blood per rectum, melena, or hematemesis Neurologic: negative for visual  changes, syncope, or dizziness All other systems reviewed and are otherwise negative except as noted above.    Blood pressure (!) 160/84, pulse 69, height 5\' 5"  (1.651 m), weight 226 lb 6.4 oz (102.7 kg), last menstrual period 09/30/2012.  General appearance: alert, cooperative, no distress and moderately obese Neck: no carotid bruit and no JVD Lungs: clear to auscultation bilaterally Heart: regular rate and rhythm Extremities: extremities normal, atraumatic, no cyanosis  or edema Pulses: 2+ and symmetric Skin: Skin color, texture, turgor normal. No rashes or lesions Neurologic: Grossly normal  EKG NSR, LVH with repol changes  ASSESSMENT AND PLAN:   Uncontrolled hypertension B/P by me 425 sytsolic  Diastolic CHF, acute ([place]) I suspect she had diastolic CHF yesterday based on her history and CXR.  HCVD (hypertensive cardiovascular disease) LVH on EKG  Non-insulin treated type 2 diabetes mellitus ([place]) Glucophage just resumed  Palpitations Chronic complaint- no serious arrhythmia has been documented.  Pt requests a 30 day monitor   PLAN  Check echo. Add Lisinopril HCTZ. Check BMP when she comes in for her echo. Check 30 day event monitor for persistent palpitations and tachycardia, not picked up on 48 hour Holter. F/U OV 3 weeks to check B/P.   Kerin Ransom PA-C 09/02/2017 2:26 PM

## 2017-09-03 ENCOUNTER — Encounter: Payer: Self-pay | Admitting: Internal Medicine

## 2017-09-03 ENCOUNTER — Ambulatory Visit (INDEPENDENT_AMBULATORY_CARE_PROVIDER_SITE_OTHER): Payer: Medicare Other | Admitting: Internal Medicine

## 2017-09-03 VITALS — BP 146/80 | HR 53 | Ht 65.0 in | Wt 227.0 lb

## 2017-09-03 DIAGNOSIS — J45991 Cough variant asthma: Secondary | ICD-10-CM | POA: Diagnosis not present

## 2017-09-03 DIAGNOSIS — F17210 Nicotine dependence, cigarettes, uncomplicated: Secondary | ICD-10-CM | POA: Diagnosis not present

## 2017-09-03 DIAGNOSIS — I10 Essential (primary) hypertension: Secondary | ICD-10-CM | POA: Diagnosis not present

## 2017-09-03 MED ORDER — BUDESONIDE-FORMOTEROL FUMARATE 80-4.5 MCG/ACT IN AERO
INHALATION_SPRAY | RESPIRATORY_TRACT | 12 refills | Status: DC
Start: 2017-09-03 — End: 2018-09-12

## 2017-09-03 MED ORDER — PANTOPRAZOLE SODIUM 40 MG PO TBEC: 40.0000 mg | DELAYED_RELEASE_TABLET | Freq: Every day | ORAL | 2 refills | Status: DC

## 2017-09-03 MED ORDER — FAMOTIDINE 20 MG PO TABS: ORAL_TABLET | ORAL | 11 refills | Status: DC

## 2017-09-03 MED ORDER — BISOPROLOL FUMARATE 5 MG PO TABS: ORAL_TABLET | ORAL | 11 refills | Status: DC

## 2017-09-03 MED ORDER — LOSARTAN POTASSIUM-HCTZ 50-12.5 MG PO TABS: 1.0000 | ORAL_TABLET | Freq: Every day | ORAL | 11 refills | Status: DC

## 2017-09-03 MED ORDER — METHYLPREDNISOLONE ACETATE 80 MG/ML IJ SUSP
120.0000 mg | Freq: Once | INTRAMUSCULAR | Status: AC
  Administered 2017-09-03: 120 mg via INTRAMUSCULAR

## 2017-09-03 NOTE — Assessment & Plan Note (Signed)
Trial off acei 09/03/2017 due to pseudowheeze Trial off high dose lopressor 09/03/2017 due to ? Asthma   Not entirely clear she has asthma at this point but whatever she has is very difficult to sort out/ treat  1) ACE inhibitors are problematic in  pts with airway complaints because  even experienced pulmonologists can't always distinguish ace effects from copd/asthma.  By themselves they don't actually cause a problem, much like oxygen can't by itself start a fire, but they certainly serve as a powerful catalyst or enhancer for any "fire"  or inflammatory process in the upper airway, be it caused by an ET  tube or more commonly reflux (especially in the obese or pts with known GERD or who are on biphoshonates).    In the era of ARB near equivalency until we have a better handle on the reversibility of the airway problem, it just makes sense to avoid ACEI  entirely in the short run and then decide later, having established a level of airway control using a reasonable limited regimen, whether to add back ace but even then being very careful to observe the pt for worsening airway control and number of meds used/ needed to control symptoms.> try losartan 50-12.5 one daily    2) In the setting of respiratory symptoms of unknown etiology,  It would be preferable to use bystolic, the most beta -1  selective Beta blocker available in sample form, with bisoprolol the most selective generic choice  on the market, at least on a trial basis, to make sure the spillover Beta 2 effects of the less specific Beta blockers are not contributing to this patient's symptoms. >  Try bisoprolol 5 mg bid

## 2017-09-03 NOTE — Assessment & Plan Note (Addendum)
Spirometry 09/03/2017  FEV1 1.90 (80%)  Ratio 81 p saba with active "wheeze"  insp > exp on ACEi and high dose lopressor > both d/c'd   - 09/03/2017  After extensive coaching inhaler device  effectiveness =    50% at best > try symb 80 2bid   DDX of  difficult airways management almost all start with A and  include Adherence, Ace Inhibitors, Acid Reflux, Active Sinus Disease, Alpha 1 Antitripsin deficiency, Anxiety masquerading as Airways dz,  ABPA,  Allergy(esp in young), Aspiration (esp in elderly), Adverse effects of meds,  Active smokers, A bunch of PE's (a small clot burden can't cause this syndrome unless there is already severe underlying pulm or vascular dz with poor reserve) plus two Bs  = Bronchiectasis and Beta blocker use..and one C= CHF   Adherence is always the initial "prime suspect" and is a multilayered concern that requires a "trust but verify" approach in every patient - starting with knowing how to use medications, especially inhalers, correctly, keeping up with refills and understanding the fundamental difference between maintenance and prns vs those medications only taken for a very short course and then stopped and not refilled.  - see hfa teaching - return one week with meds in 2 bags, maint vs prn  using a trust but verify approach to confirm accurate Medication  Reconciliation The principal here is that until we are certain that the  patients are doing what we've asked, it makes no sense to ask them to do more.   Active smoking > (see separate a/p)   ACEi adverse effects also at the  top of the usual list of suspects and the only way to rule it out is a trial off > see a/p     ? Acid (or non-acid) GERD > always difficult to exclude as up to 75% of pts in some series report no assoc GI/ Heartburn symptoms> rec max (24h)  acid suppression and diet restrictions/ reviewed and instructions given in writing.    ? Allergy > depomedrol 120 mg IM but no further steroids  ?  Anxiety/depression/ vcd  > usually at the bottom of this list of usual suspects  As may interfere with adherence and also interpretation of response or lack thereof to symptom management which can be quite subjective.  ? Bb effects > see hbp  ? chf > unlikely with bnp so low

## 2017-09-03 NOTE — Assessment & Plan Note (Signed)
4-5 min discussion re active cigarette smoking in addition to office E&M  Ask about tobacco use:   active Advise quitting   I emphasized that although we never turn away smokers from the pulmonary clinic, we do ask that they understand that the recommendations that we make  won't work nearly as well in the presence of continued cigarette exposure. In fact, we may very well  reach a point where we can't promise to help the patient if he/she can't quit smoking. (We can and will promise to try to help, we just can't promise what we recommend will really work)  Assess willingness:  Not committed at this point Assist in quit attempt:  Per PCP when ready Arrange follow up:   Follow up per Primary Care planned         Total time devoted to counseling  > 50 % of initial 60 min office visit:  review case with pt/ discussion of options/alternatives/ personally creating written customized instructions  in presence of pt  then going over those specific  Instructions directly with the pt including how to use all of the meds but in particular covering each new medication in detail and the difference between the maintenance= "automatic" meds and the prns using an action plan format for the latter (If this problem/symptom => do that organization reading Left to right).  Please see AVS from this visit for a full list of these instructions which I personally wrote for this pt and  are unique to this visit.   See device teaching which extended face to face time for this visit

## 2017-09-03 NOTE — Progress Notes (Signed)
Rebecca Malone, female    DOB: 15-Jan-1966, 52 y.o.   MRN: [id number]    Brief patient profile:  9 yobf active smoker with lifelong asthma worse since 05/2017 attributed to lack of ac and marked increase use of saba since then so self referred to pulmonary clinic 09/03/2017  with nl spirometry p saba    09/03/2017  1st pulmonary eval/ Samayra Hebel  Chief Complaint  Patient presents with  . Pulmonary Consult    Self referral. Pt states dxed with Asthma as a child. She has occ wheezing and increased SOB since May 2019. She is using her albuterol inhaler 8-9 x per day on average.   variable sob x years with daily need for saba and not clear she's been on effective maint rx  Though thinks symbicort may have helped in past much worse x 3 m of no a/c in bedroom though assoc dry cough/ hoarsness.  No obvious day to day or daytime variability or assoc excess/ purulent sputum or mucus plugs or hemoptysis or cp or chest tightness,   or overt sinus or hb symptoms.    Also denies any obvious fluctuation of symptoms with weather or environmental changes or other aggravating or alleviating factors except as outlined above   No unusual exposure hx or h/o childhood pna/ asthma or knowledge of premature birth.  Current Allergies, Complete Past Medical History, Past Surgical History, Family History, and Social History were reviewed in [employer] record.  ROS  The following are not active complaints unless bolded Hoarseness, sore throat, dysphagia, dental problems, itching, sneezing,  nasal congestion or discharge of excess mucus or purulent secretions, ear ache,   fever, chills, sweats, unintended wt loss or wt gain, classically pleuritic or exertional cp,  orthopnea pnd or arm/hand swelling  or leg swelling, presyncope, palpitations, abdominal pain, anorexia, nausea, vomiting, diarrhea  or change in bowel habits or change in bladder habits, change in stools or change in urine, dysuria,  hematuria,  rash, arthralgias, visual complaints, headache, numbness, weakness or ataxia or problems with walking or coordination,  change in mood or  memory.        Current Meds  Medication Sig  . albuterol (PROVENTIL HFA;VENTOLIN HFA) 108 (90 Base) MCG/ACT inhaler Inhale 2 puffs into the lungs every 6 (six) hours as needed for shortness of breath. For asthma  . albuterol (PROVENTIL) (5 MG/ML) 0.5% nebulizer solution Take 0.5 mLs (2.5 mg total) by nebulization every 6 (six) hours as needed for wheezing or shortness of breath.  Marland Kitchen aspirin EC 81 MG EC tablet Take 1 tablet (81 mg total) by mouth daily.  . diclofenac sodium (VOLTAREN) 1 % GEL Apply topically 4 (four) times daily as needed.  Marland Kitchen HYDROcodone-acetaminophen (NORCO) 10-325 MG tablet Take 1 tablet by mouth every 6 (six) hours as needed.  . metFORMIN (GLUCOPHAGE) 500 MG tablet Take 2 tablets (1,000 mg total) by mouth 2 (two) times daily with a meal.  .   lisinopril-hydrochlorothiazide (ZESTORETIC) 10-12.5 MG tablet Take 1 tablet by mouth daily.  . [  metoprolol succinate (TOPROL-XL) 100 MG 24 hr tablet Take 1 tablet (100 mg total) by mouth daily. Take with or immediately following a meal.         Past Medical History:  Diagnosis Date  . Asthma   . Chest pain march 2014   admitted to hospital   . Degenerative arthritis    "knees" (03/07/2015)  . GERD (gastroesophageal reflux disease)   . History of  blood transfusion 10/2009   "related to losing too much blood w/my period"  . Hypertension   . MI (myocardial infarction) [facility]) March 2014  . Obesity   . OSA on CPAP   . Pneumonia 2008  . Tachycardia march 2014   admitted to hospital  . Type II diabetes mellitus [facility])     Outpatient Medications Prior to Visit  Medication Sig Dispense Refill  . albuterol (PROVENTIL HFA;VENTOLIN HFA) 108 (90 Base) MCG/ACT inhaler Inhale 2 puffs into the lungs every 6 (six) hours as needed for shortness of breath. For asthma 6.7 g 1  . albuterol  (PROVENTIL) (5 MG/ML) 0.5% nebulizer solution Take 0.5 mLs (2.5 mg total) by nebulization every 6 (six) hours as needed for wheezing or shortness of breath. 20 mL 0  . aspirin EC 81 MG EC tablet Take 1 tablet (81 mg total) by mouth daily.    . diclofenac sodium (VOLTAREN) 1 % GEL Apply topically 4 (four) times daily as needed.    Marland Kitchen HYDROcodone-acetaminophen (NORCO) 10-325 MG tablet Take 1 tablet by mouth every 6 (six) hours as needed.    Marland Kitchen lisinopril-hydrochlorothiazide (ZESTORETIC) 10-12.5 MG tablet Take 1 tablet by mouth daily. 90 tablet 3  . metFORMIN (GLUCOPHAGE) 500 MG tablet Take 2 tablets (1,000 mg total) by mouth 2 (two) times daily with a meal. 120 tablet 0  . metoprolol succinate (TOPROL-XL) 100 MG 24 hr tablet Take 1 tablet (100 mg total) by mouth daily. Take with or immediately following a meal. 30 tablet 0  . Blood Pressure Monitoring (BLOOD PRESSURE MONITOR AUTOMAT) DEVI 1 mL by Does not apply route daily. 1 Device 0  . omeprazole (PRILOSEC) 20 MG capsule Take 1 capsule (20 mg total) by mouth 2 (two) times daily. (Patient not taking: Reported on 09/01/2017) 60 capsule 1  . ondansetron (ZOFRAN ODT) 4 MG disintegrating tablet Take 1 tablet (4 mg total) by mouth every 8 (eight) hours as needed for nausea or vomiting. (Patient not taking: Reported on 09/01/2017) 20 tablet 0  . sucralfate (CARAFATE) 1 g tablet Take 1 tablet (1 g total) by mouth 4 (four) times daily -  with meals and at bedtime. (Patient not taking: Reported on 09/01/2017) 120 tablet 0   No facility-administered medications prior to visit.              Objective:     BP (!) 146/80 (BP Location: Left Arm, Cuff Size: Large)   Pulse (!) 53   Ht 5\' 5"  (1.651 m)   Wt 227 lb (103 kg)   LMP 09/30/2012   SpO2 98%   BMI 37.77 kg/m   SpO2: 98 % RA   Animate pleasant amb bf nad  HEENT: nl dentition, turbinates bilaterally, and oropharynx. Nl external ear canals without cough reflex   NECK :  without JVD/Nodes/TM/ nl  carotid upstrokes bilaterally   LUNGS: no acc muscle use,  Nl contour chest with striking pseudowheeze   CV:  RRR  no s3 or murmur or increase in P2, and no edema   ABD:  soft and nontender with nl inspiratory excursion in the supine position. No bruits or organomegaly appreciated, bowel sounds nl  MS:  Nl gait/ ext warm without deformities, calf tenderness, cyanosis or clubbing No obvious joint restrictions   SKIN: warm and dry without lesions    NEURO:  alert, approp, nl sensorium with  no motor or cerebellar deficits apparent.       I personally reviewed images and agree with  radiology impression as follows:  CXR:   09/01/17 Vascular congestion noted; peribronchial thickening seen. Lungs otherwise grossly clear.    Lab Results  Component Value Date   WBC 7.6 09/01/2017   HGB 13.2 09/01/2017   HCT 42.2 09/01/2017   MCV 86.1 09/01/2017   PLT 234 09/01/2017       EOS                                                              0.2                                      09/01/17        BNP                                                               35                                       09/01/17    Assessment   Cough variant asthma vs uacs Spirometry 09/03/2017  FEV1 1.90 (80%)  Ratio 81 p saba with active "wheeze"  insp > exp on ACEi and high dose lopressor > both d/c'd   - 09/03/2017  After extensive coaching inhaler device  effectiveness =    50% at best > try symb 80 2bid   DDX of  difficult airways management almost all start with A and  include Adherence, Ace Inhibitors, Acid Reflux, Active Sinus Disease, Alpha 1 Antitripsin deficiency, Anxiety masquerading as Airways dz,  ABPA,  Allergy(esp in young), Aspiration (esp in elderly), Adverse effects of meds,  Active smokers, A bunch of PE's (a small clot burden can't cause this syndrome unless there is already severe underlying pulm or vascular dz with poor reserve) plus two Bs  = Bronchiectasis and Beta blocker use..and one C=  CHF   Adherence is always the initial "prime suspect" and is a multilayered concern that requires a "trust but verify" approach in every patient - starting with knowing how to use medications, especially inhalers, correctly, keeping up with refills and understanding the fundamental difference between maintenance and prns vs those medications only taken for a very short course and then stopped and not refilled.  - see hfa teaching - return one week with meds in 2 bags, maint vs prn  using a trust but verify approach to confirm accurate Medication  Reconciliation The principal here is that until we are certain that the  patients are doing what we've asked, it makes no sense to ask them to do more.   Active smoking > (see separate a/p)   ACEi adverse effects also at the  top of the usual list of suspects and the only way to rule it out is a trial off > see a/p     ? Acid (or non-acid) GERD > always difficult to exclude as up to 75%  of pts in some series report no assoc GI/ Heartburn symptoms> rec max (24h)  acid suppression and diet restrictions/ reviewed and instructions given in writing.    ? Allergy > depomedrol 120 mg IM but no further steroids  ? Anxiety/depression/ vcd  > usually at the bottom of this list of usual suspects  As may interfere with adherence and also interpretation of response or lack thereof to symptom management which can be quite subjective.  ? Bb effects > see hbp  ? chf > unlikely with bnp so low        Essential hypertension Trial off acei 09/03/2017 due to pseudowheeze Trial off high dose lopressor 09/03/2017 due to ? Asthma   Not entirely clear she has asthma at this point but whatever she has is very difficult to sort out/ treat  1) ACE inhibitors are problematic in  pts with airway complaints because  even experienced pulmonologists can't always distinguish ace effects from copd/asthma.  By themselves they don't actually cause a problem, much like oxygen can't  by itself start a fire, but they certainly serve as a powerful catalyst or enhancer for any "fire"  or inflammatory process in the upper airway, be it caused by an ET  tube or more commonly reflux (especially in the obese or pts with known GERD or who are on biphoshonates).    In the era of ARB near equivalency until we have a better handle on the reversibility of the airway problem, it just makes sense to avoid ACEI  entirely in the short run and then decide later, having established a level of airway control using a reasonable limited regimen, whether to add back ace but even then being very careful to observe the pt for worsening airway control and number of meds used/ needed to control symptoms.> try losartan 50-12.5 one daily    2) In the setting of respiratory symptoms of unknown etiology,  It would be preferable to use bystolic, the most beta -1  selective Beta blocker available in sample form, with bisoprolol the most selective generic choice  on the market, at least on a trial basis, to make sure the spillover Beta 2 effects of the less specific Beta blockers are not contributing to this patient's symptoms. >  Try bisoprolol 5 mg bid   Cigarette smoker 4-5 min discussion re active cigarette smoking in addition to office E&M  Ask about tobacco use:   active Advise quitting   I emphasized that although we never turn away smokers from the pulmonary clinic, we do ask that they understand that the recommendations that we make  won't work nearly as well in the presence of continued cigarette exposure. In fact, we may very well  reach a point where we can't promise to help the patient if he/she can't quit smoking. (We can and will promise to try to help, we just can't promise what we recommend will really work)  Assess willingness:  Not committed at this point Assist in quit attempt:  Per PCP when ready Arrange follow up:   Follow up per Primary Care planned         Total time devoted to  counseling  > 50 % of initial 60 min office visit:  review case with pt/ discussion of options/alternatives/ personally creating written customized instructions  in presence of pt  then going over those specific  Instructions directly with the pt including how to use all of the meds but in particular covering each new  medication in detail and the difference between the maintenance= "automatic" meds and the prns using an action plan format for the latter (If this problem/symptom => do that organization reading Left to right).  Please see AVS from this visit for a full list of these instructions which I personally wrote for this pt and  are unique to this visit.   See device teaching which extended face to face time for this visit          Christinia Gully, MD 09/03/2017

## 2017-09-03 NOTE — Patient Instructions (Addendum)
Depomerol 120 mg IM  Stop lisinopril and start losartan 50- 12.5 mg daily   Stop lopressor and start bisoprolol 5 mg twice daily   Plan A = Automatic = symbiocort 80 Take 2 puffs first thing in am and then another 2 puffs about 12 hours later.   Work on inhaler technique:  relax and gently blow all the way out then take a nice smooth deep breath back in, triggering the inhaler at same time you start breathing in.  Hold for up to 5 seconds if you can. Blow out thru nose. Rinse and gargle with water when done     Plan B = Backup Only use your albuterol as a rescue medication to be used if you can't catch your breath by resting or doing a relaxed purse lip breathing pattern.  - The less you use it, the better it will work when you need it. - Ok to use the inhaler up to 2 puffs  every 4 hours if you must but call for appointment if use goes up over your usual need - Don't leave home without it !!  (think of it like the spare tire for your car)   Plan C = Crisis - only use your albuterol nebulizer if you first try Plan B and it fails to help > ok to use the nebulizer up to every 4 hours but if start needing it regularly call for immediate appointment   Pantoprazole (protonix) 40 mg   Take  30-60 min before first meal of the day and Pepcid (famotidine)  20 mg one @  bedtime until return to office - this is the best way to tell whether stomach acid is contributing to your problem.     GERD (REFLUX)  is an extremely common cause of respiratory symptoms just like yours , many times with no obvious heartburn at all.    It can be treated with medication, but also with lifestyle changes including elevation of the head of your bed (ideally with 6 inch  bed blocks),  Smoking cessation, avoidance of late meals, excessive alcohol, and avoid fatty foods, chocolate, peppermint, colas, red wine, and acidic juices such as orange juice.  NO MINT OR MENTHOL PRODUCTS SO NO COUGH DROPS   USE SUGARLESS CANDY  INSTEAD (Jolley ranchers or Stover's or Life Savers) or even ice chips will also do - the key is to swallow to prevent all throat clearing. NO OIL BASED VITAMINS - use powdered substitutes.    The key is to stop smoking completely before smoking completely stops you!    Please schedule a follow up office visit in  1 week, call sooner if needed with all medications /inhalers/ solutions in hand so we can verify exactly what you are taking. This includes all medications from all doctors and over the [place] separate them into two bags:  the ones you take automatically, no matter what, vs the ones you take just when you feel you need them "BAG #2 is UP TO YOU"  - this will really help [place] help you take your medications more effectively.

## 2017-09-08 ENCOUNTER — Other Ambulatory Visit (HOSPITAL_COMMUNITY): Payer: Medicare Other

## 2017-09-08 ENCOUNTER — Ambulatory Visit: Payer: Medicare Other | Admitting: Internal Medicine

## 2017-09-13 ENCOUNTER — Ambulatory Visit (HOSPITAL_COMMUNITY): Payer: Medicare Other | Attending: Cardiology

## 2017-09-13 ENCOUNTER — Ambulatory Visit (INDEPENDENT_AMBULATORY_CARE_PROVIDER_SITE_OTHER): Payer: Medicare Other

## 2017-09-13 ENCOUNTER — Other Ambulatory Visit: Payer: Self-pay

## 2017-09-13 ENCOUNTER — Other Ambulatory Visit: Payer: Self-pay | Admitting: Cardiology

## 2017-09-13 DIAGNOSIS — I491 Atrial premature depolarization: Secondary | ICD-10-CM

## 2017-09-13 DIAGNOSIS — R002 Palpitations: Secondary | ICD-10-CM

## 2017-09-13 DIAGNOSIS — R0600 Dyspnea, unspecified: Secondary | ICD-10-CM | POA: Insufficient documentation

## 2017-09-13 DIAGNOSIS — I10 Essential (primary) hypertension: Secondary | ICD-10-CM

## 2017-09-13 DIAGNOSIS — R000 Tachycardia, unspecified: Secondary | ICD-10-CM

## 2017-09-13 DIAGNOSIS — I509 Heart failure, unspecified: Secondary | ICD-10-CM | POA: Insufficient documentation

## 2017-09-13 DIAGNOSIS — I110 Hypertensive heart disease with heart failure: Secondary | ICD-10-CM | POA: Diagnosis not present

## 2017-09-13 DIAGNOSIS — I493 Ventricular premature depolarization: Secondary | ICD-10-CM

## 2017-09-13 DIAGNOSIS — I361 Nonrheumatic tricuspid (valve) insufficiency: Secondary | ICD-10-CM | POA: Diagnosis not present

## 2017-09-13 DIAGNOSIS — Z720 Tobacco use: Secondary | ICD-10-CM | POA: Diagnosis not present

## 2017-09-13 DIAGNOSIS — R9431 Abnormal electrocardiogram [ECG] [EKG]: Secondary | ICD-10-CM

## 2017-09-13 DIAGNOSIS — Z6837 Body mass index (BMI) 37.0-37.9, adult: Secondary | ICD-10-CM | POA: Diagnosis not present

## 2017-09-13 DIAGNOSIS — D649 Anemia, unspecified: Secondary | ICD-10-CM | POA: Insufficient documentation

## 2017-09-13 DIAGNOSIS — E119 Type 2 diabetes mellitus without complications: Secondary | ICD-10-CM | POA: Diagnosis not present

## 2017-09-22 ENCOUNTER — Ambulatory Visit: Payer: Medicare Other | Admitting: Cardiology

## 2017-10-01 ENCOUNTER — Telehealth: Payer: Self-pay | Admitting: Cardiovascular Disease

## 2017-10-01 NOTE — Telephone Encounter (Signed)
New Message:     Patient is calling for Echo results

## 2017-10-01 NOTE — Telephone Encounter (Signed)
Spoke with pt. Pt aware of echo results with verbal understanding.   Notes recorded by Erlene Quan, PA-C on 09/20/2017 at 9:05 AM EDT Please let the patient know her echo looked good, she has some mild changes secondary to HTN and its important she keeps that under control. Keep f/u with Dr Sallyanne Kuster as scheduled.

## 2017-10-13 ENCOUNTER — Telehealth: Payer: Self-pay | Admitting: Internal Medicine

## 2017-10-13 DIAGNOSIS — J45991 Cough variant asthma: Secondary | ICD-10-CM

## 2017-10-13 MED ORDER — ALBUTEROL SULFATE (5 MG/ML) 0.5% IN NEBU: 2.5000 mg | INHALATION_SOLUTION | Freq: Four times a day (QID) | RESPIRATORY_TRACT | 5 refills | Status: DC | PRN

## 2017-10-13 MED ORDER — ALBUTEROL SULFATE HFA 108 (90 BASE) MCG/ACT IN AERS: 2.0000 | INHALATION_SPRAY | Freq: Four times a day (QID) | RESPIRATORY_TRACT | 3 refills | Status: DC | PRN

## 2017-10-13 MED ORDER — ALBUTEROL SULFATE (2.5 MG/3ML) 0.083% IN NEBU: 2.5000 mg | INHALATION_SOLUTION | Freq: Four times a day (QID) | RESPIRATORY_TRACT | 5 refills | Status: DC | PRN

## 2017-10-13 NOTE — Telephone Encounter (Signed)
Spoke with patient, patient requesting refills of Proventil inhaler and proventil nebulizer solution. Patient request that we call in the proventil vials instead of the bottle as it does not last for 30 days. Refill sent. Voiced understanding. [facility] contacted, proventil in vial form needs to be order under albuterol as they are the same thing. Order placed under albuterol. Nothing further needed at this time.

## 2017-10-27 ENCOUNTER — Encounter: Payer: Self-pay | Admitting: Internal Medicine

## 2017-10-27 ENCOUNTER — Ambulatory Visit: Payer: Medicare Other | Admitting: Internal Medicine

## 2017-10-28 ENCOUNTER — Ambulatory Visit: Payer: Medicare Other | Admitting: Cardiovascular Disease

## 2017-10-29 ENCOUNTER — Encounter: Payer: Self-pay | Admitting: *Deleted

## 2017-11-02 ENCOUNTER — Ambulatory Visit: Payer: Medicare Other | Admitting: Family

## 2017-11-02 DIAGNOSIS — Z0289 Encounter for other administrative examinations: Secondary | ICD-10-CM

## 2017-11-29 ENCOUNTER — Ambulatory Visit: Payer: Medicare Other | Admitting: Internal Medicine

## 2017-12-01 ENCOUNTER — Telehealth: Payer: Self-pay | Admitting: Internal Medicine

## 2017-12-01 NOTE — Telephone Encounter (Signed)
Called patients pharmacy. Unable to reach as they are on lunch. Left message to give [place] a call back.

## 2017-12-03 MED ORDER — LOSARTAN POTASSIUM 50 MG PO TABS: 50.0000 mg | ORAL_TABLET | Freq: Every day | ORAL | 1 refills | Status: DC

## 2017-12-03 MED ORDER — HYDROCHLOROTHIAZIDE 12.5 MG PO TABS: 12.5000 mg | ORAL_TABLET | Freq: Every day | ORAL | 1 refills | Status: DC

## 2017-12-03 NOTE — Telephone Encounter (Signed)
Fine with me

## 2017-12-03 NOTE — Telephone Encounter (Signed)
Spoke with pharmacist and advised him that I would send in the medications per approval from Dr. Melvyn Novas. He verbalized understanding and nothing further is needed.

## 2017-12-03 NOTE — Telephone Encounter (Signed)
Called and spoke with [place] at [phone] Per Barnabas Lister the pharmacist, he states that Hyzaar 50-12.5mg  tabs are on back order currently. He would like to know if MW would be okay if he filled RX Losartan 50mg  and Hctz 10mg  for pt at this time  MW please advise.

## 2017-12-03 NOTE — Telephone Encounter (Signed)
Will route to triage for follow as [facility] is not open yet.

## 2018-02-05 ENCOUNTER — Other Ambulatory Visit: Payer: Self-pay

## 2018-02-05 ENCOUNTER — Emergency Department (HOSPITAL_COMMUNITY)
Admission: EM | Admit: 2018-02-05 | Discharge: 2018-02-05 | Disposition: A | Discharge status: Home / Self Care | Payer: Medicare Other | Attending: Emergency Medicine | Admitting: Emergency Medicine

## 2018-02-05 ENCOUNTER — Emergency Department (HOSPITAL_COMMUNITY): Payer: Medicare Other

## 2018-02-05 ENCOUNTER — Encounter (HOSPITAL_COMMUNITY): Payer: Self-pay

## 2018-02-05 DIAGNOSIS — I5031 Acute diastolic (congestive) heart failure: Secondary | ICD-10-CM | POA: Diagnosis not present

## 2018-02-05 DIAGNOSIS — J209 Acute bronchitis, unspecified: Secondary | ICD-10-CM | POA: Insufficient documentation

## 2018-02-05 DIAGNOSIS — Z7982 Long term (current) use of aspirin: Secondary | ICD-10-CM | POA: Diagnosis not present

## 2018-02-05 DIAGNOSIS — I110 Hypertensive heart disease with heart failure: Secondary | ICD-10-CM | POA: Insufficient documentation

## 2018-02-05 DIAGNOSIS — R05 Cough: Secondary | ICD-10-CM

## 2018-02-05 DIAGNOSIS — Z7984 Long term (current) use of oral hypoglycemic drugs: Secondary | ICD-10-CM | POA: Diagnosis not present

## 2018-02-05 DIAGNOSIS — R059 Cough, unspecified: Secondary | ICD-10-CM

## 2018-02-05 DIAGNOSIS — J45909 Unspecified asthma, uncomplicated: Secondary | ICD-10-CM | POA: Diagnosis not present

## 2018-02-05 DIAGNOSIS — E119 Type 2 diabetes mellitus without complications: Secondary | ICD-10-CM | POA: Diagnosis not present

## 2018-02-05 DIAGNOSIS — Z79899 Other long term (current) drug therapy: Secondary | ICD-10-CM | POA: Insufficient documentation

## 2018-02-05 DIAGNOSIS — F17210 Nicotine dependence, cigarettes, uncomplicated: Secondary | ICD-10-CM | POA: Diagnosis not present

## 2018-02-05 LAB — CBG MONITORING, ED: Glucose-Capillary: 181 mg/dL — ABNORMAL HIGH (ref 70–99)

## 2018-02-05 MED ORDER — IPRATROPIUM-ALBUTEROL 0.5-2.5 (3) MG/3ML IN SOLN
3.0000 mL | Freq: Once | RESPIRATORY_TRACT | Status: AC
Start: 2018-02-05 — End: 2018-02-05
  Administered 2018-02-05: 3 mL via RESPIRATORY_TRACT
  Filled 2018-02-05: qty 3

## 2018-02-05 MED ORDER — PREDNISONE 20 MG PO TABS: 40.0000 mg | ORAL_TABLET | Freq: Every day | ORAL | 0 refills | Status: DC

## 2018-02-05 MED ORDER — PREDNISONE 20 MG PO TABS
40.0000 mg | ORAL_TABLET | Freq: Once | ORAL | Status: AC
  Administered 2018-02-05: 40 mg via ORAL
  Filled 2018-02-05: qty 2

## 2018-02-05 NOTE — ED Triage Notes (Signed)
Pt reports cough for the last 4 days. She states that it prevents a good night's sleep. Pt has had to increasingly use her nebulizer and inhaler d/t wheezing as well. Pt suspects bronchitis and has a hx of same. She reports anterior chest pain with coughing.

## 2018-02-05 NOTE — ED Notes (Signed)
Ambulated  In hallway without distress , O2 sat sustained > 95% on RA.

## 2018-02-05 NOTE — ED Provider Notes (Signed)
[facility] DEPT Provider Note   CSN: [id number] Arrival date & time: 02/05/18  1940     History   Chief Complaint Chief Complaint  Patient presents with  . Cough    HPI Rebecca Malone is a 53 y.o. female.  The history is provided by the patient and medical records. No language interpreter was used.  Cough  Associated symptoms: chills, fever and wheezing   Associated symptoms: no shortness of breath and no sore throat     Rebecca Malone is a 53 y.o. female  with a PMH as listed below who presents to the Emergency Department complaining of worsening cough for the last 4 days.  Mostly dry, but intermittently productive.  Associated with wheezing.  She reports a fever 2 days ago, but none yesterday or today.  She has taken neb treatments as well as using both of her inhalers without any improvement.  She endorses some chest pain with coughing.  No exertional chest pain or pain when not coughing.  No shortness of breath.  Past Medical History:  Diagnosis Date  . Asthma   . Chest pain march 2014   admitted to hospital   . Degenerative arthritis    "knees" (03/07/2015)  . GERD (gastroesophageal reflux disease)   . History of blood transfusion 10/2009   "related to losing too much blood w/my period"  . Hypertension   . MI (myocardial infarction) [facility]) March 2014  . Obesity   . OSA on CPAP   . Pneumonia 2008  . Tachycardia march 2014   admitted to hospital  . Type II diabetes mellitus [facility])     Patient Active Problem List   Diagnosis Date Noted  . Diastolic CHF, acute ([place]) 09/02/2017  . HCVD (hypertensive cardiovascular disease) 09/02/2017  . Influenza A 03/07/2015  . Essential hypertension 06/09/2013  . Palpitations 06/07/2012  . Cigarette smoker 06/07/2012  . Acute asthma exacerbation 04/08/2012  . CAP (community acquired pneumonia) 04/08/2012  . SOB (shortness of breath) 04/08/2012  . Non-insulin treated type 2 diabetes mellitus  ([place]) 09/11/2011  . Degenerative arthritis of knee 09/11/2011  . Neuropathy due to secondary diabetes ([place]) 09/11/2011  . Chest pain on exertion 09/09/2011  . Edema extremities 09/09/2011  . Anemia 09/09/2011  . OBESITY, MORBID 06/28/2008  . Cough variant asthma vs uacs 06/28/2008  . BACK PAIN, LUMBAR 06/28/2008    Past Surgical History:  Procedure Laterality Date  . CESAREAN SECTION  1999     OB History   No obstetric history on file.      Home Medications    Prior to Admission medications   Medication Sig Start Date End Date Taking? Authorizing Provider  albuterol (PROVENTIL HFA;VENTOLIN HFA) 108 (90 Base) MCG/ACT inhaler Inhale 2 puffs into the lungs every 6 (six) hours as needed for shortness of breath. For asthma 10/13/17   Tanda Rockers, MD  albuterol (PROVENTIL) (2.5 MG/3ML) 0.083% nebulizer solution Take 3 mLs (2.5 mg total) by nebulization every 6 (six) hours as needed for wheezing or shortness of breath. 10/13/17   Tanda Rockers, MD  aspirin EC 81 MG EC tablet Take 1 tablet (81 mg total) by mouth daily. 04/09/12   Elvera Lennox, MD  bisoprolol (ZEBETA) 5 MG tablet One twice daily 09/03/17   Tanda Rockers, MD  budesonide-formoterol [facility]) 80-4.5 MCG/ACT inhaler Take 2 puffs first thing in am and then another 2 puffs about 12 hours later. 09/03/17   Tanda Rockers,  MD  diclofenac sodium (VOLTAREN) 1 % GEL Apply topically 4 (four) times daily as needed.    [provider]  famotidine (PEPCID) 20 MG tablet One at bedtime 09/03/17   Tanda Rockers, MD  hydrochlorothiazide (HYDRODIURIL) 12.5 MG tablet Take 1 tablet (12.5 mg total) by mouth daily. 12/03/17   Tanda Rockers, MD  HYDROcodone-acetaminophen (NORCO) 10-325 MG tablet Take 1 tablet by mouth every 6 (six) hours as needed.    [provider]  losartan (COZAAR) 50 MG tablet Take 1 tablet (50 mg total) by mouth daily. 12/03/17   Tanda Rockers, MD  losartan-hydrochlorothiazide (HYZAAR) 50-12.5 MG  tablet Take 1 tablet by mouth daily. 09/03/17   Tanda Rockers, MD  metFORMIN (GLUCOPHAGE) 500 MG tablet Take 2 tablets (1,000 mg total) by mouth 2 (two) times daily with a meal. 09/01/17 10/01/17  Fawze, Mina A, PA-C  pantoprazole (PROTONIX) 40 MG tablet Take 1 tablet (40 mg total) by mouth daily. Take 30-60 min before first meal of the day 09/03/17   Tanda Rockers, MD  predniSONE (DELTASONE) 20 MG tablet Take 2 tablets (40 mg total) by mouth daily. 02/05/18   , Ozella Almond, PA-C    Family History Family History  Problem Relation Age of Onset  . Heart attack Father   . Diabetes Sister   . Diabetes Brother     Social History Social History   Tobacco Use  . Smoking status: Current Every Day Smoker    Packs/day: 0.50    Years: 20.00    Pack years: 10.00    Types: Cigarettes  . Smokeless tobacco: Never Used  Substance Use Topics  . Alcohol use: Yes    Comment: 03/07/2015 "might have 4 mixed drinks/month"  . Drug use: No     Allergies   Advair diskus [fluticasone-salmeterol]; Other; and Pork-derived products   Review of Systems Review of Systems  Constitutional: Positive for chills and fever.  HENT: Positive for congestion. Negative for sore throat.   Respiratory: Positive for cough and wheezing. Negative for shortness of breath.   All other systems reviewed and are negative.    Physical Exam Updated Vital Signs BP (!) 165/71 (BP Location: Right Arm)   Pulse 74   Temp (!) 97.3 F (36.3 C) (Oral)   Resp 17   Ht 5\' 5"  (1.651 m)   Wt 101.2 kg   LMP 09/30/2012   SpO2 96%   BMI 37.11 kg/m   Physical Exam Vitals signs and nursing note reviewed.  Constitutional:      General: She is not in acute distress.    Appearance: She is well-developed.  HENT:     Head: Normocephalic and atraumatic.     Nose: Congestion present.     Mouth/Throat:     Mouth: Mucous membranes are moist.     Pharynx: No oropharyngeal exudate.  Cardiovascular:     Rate and Rhythm: Normal  rate and regular rhythm.     Heart sounds: Normal heart sounds. No murmur.  Pulmonary:     Effort: Pulmonary effort is normal.     Breath sounds: Normal breath sounds.     Comments: Expiratory wheezing bilaterally. Abdominal:     General: There is no distension.     Palpations: Abdomen is soft.     Tenderness: There is no abdominal tenderness.  Skin:    General: Skin is warm and dry.  Neurological:     Mental Status: She is alert and oriented to person,  place, and time.      ED Treatments / Results  Labs (all labs ordered are listed, but only abnormal results are displayed) Labs Reviewed  CBG MONITORING, ED - Abnormal; Notable for the following components:      Result Value   Glucose-Capillary 181 (*)    All other components within normal limits    EKG EKG Interpretation  Date/Time:  Saturday February 05 2018 21:01:08 EST Ventricular Rate:  80 PR Interval:    QRS Duration: 89 QT Interval:  359 QTC Calculation: 415 R Axis:   68 Text Interpretation:  Sinus rhythm Probable LVH with secondary repol abnrm No significant change since last tracing Confirmed by Orlie Dakin [phone]) on 02/05/2018 9:09:31 PM   Radiology Dg Chest 2 View  Result Date: 02/05/2018 CLINICAL DATA:  Cough for 4 days.  Anterior chest pain with cough. EXAM: CHEST - 2 VIEW COMPARISON:  09/01/2017 FINDINGS: Normal heart size and pulmonary vascularity. No focal airspace disease or consolidation in the lungs. No blunting of costophrenic angles. No pneumothorax. Mediastinal contours appear intact. Degenerative changes in the spine. IMPRESSION: No active cardiopulmonary disease. Electronically Signed   By: Lucienne Capers M.D.   On: 02/05/2018 20:34    Procedures Procedures (including critical care time)  Medications Ordered in ED Medications  ipratropium-albuterol (DUONEB) 0.5-2.5 (3) MG/3ML nebulizer solution 3 mL (3 mLs Nebulization Given 02/05/18 2031)  predniSONE (DELTASONE) tablet 40 mg (40 mg Oral  Given 02/05/18 2127)     Initial Impression / Assessment and Plan / ED Course  I have reviewed the triage vital signs and the nursing notes.  Pertinent labs & imaging results that were available during my care of the patient were reviewed by me and considered in my medical decision making (see chart for details).    Rebecca Malone is a 53 y.o. female who presents to ED for cough, congestion for the last 4 days.  On exam, patient is afebrile, hemodynamically stable with wheezing bilaterally.  Given neb treatment with some improvement.  Patient with history of diabetes.  She is requesting prednisone to help with her asthma exacerbation.  We discussed risks and benefits of steroids at length.  Specifically discussed that she is already hyperglycemic at 181 and that she will really need to monitor her blood sugar closely, adhere to her diabetic diet and take all medications without missed doses.  She agrees and would like to proceed with prednisone course despite these risks.  This will help with her respiratory status.  Short burst provided.  Recommend that she follow-up with her primary care doctor for recheck. Reasons to return to the emergency department were discussed and all questions answered.  Final Clinical Impressions(s) / ED Diagnoses   Final diagnoses:  Cough  Acute bronchitis, unspecified organism    ED Discharge Orders         Ordered    predniSONE (DELTASONE) 20 MG tablet  Daily     02/05/18 2128           , Ozella Almond, PA-C 02/05/18 2207    Isla Pence, MD 02/06/18 1500

## 2018-02-05 NOTE — Discharge Instructions (Signed)
It was my pleasure taking care of you today!   Continue using your inhalers and nebulizer treatments as needed.  Prednisone daily starting tomorrow morning.   As we discussed, it is very important that you monitor your blood sugar very closely while you are on prednisone. Take your medication as directed and follow your diabetic diet.   Follow up with your primary care doctor. Call on Monday to schedule an appointment.  Return to ER for new or worsening symptoms, any additional concerns.

## 2018-04-11 ENCOUNTER — Emergency Department (HOSPITAL_COMMUNITY)
Admission: EM | Admit: 2018-04-11 | Discharge: 2018-04-11 | Disposition: A | Discharge status: Home / Self Care | Payer: Medicare Other | Attending: Emergency Medicine | Admitting: Emergency Medicine

## 2018-04-11 ENCOUNTER — Encounter (HOSPITAL_COMMUNITY): Payer: Self-pay

## 2018-04-11 ENCOUNTER — Other Ambulatory Visit: Payer: Self-pay

## 2018-04-11 ENCOUNTER — Emergency Department (HOSPITAL_COMMUNITY): Payer: Medicare Other

## 2018-04-11 DIAGNOSIS — M545 Low back pain, unspecified: Secondary | ICD-10-CM

## 2018-04-11 DIAGNOSIS — F17210 Nicotine dependence, cigarettes, uncomplicated: Secondary | ICD-10-CM | POA: Diagnosis not present

## 2018-04-11 DIAGNOSIS — I110 Hypertensive heart disease with heart failure: Secondary | ICD-10-CM | POA: Diagnosis not present

## 2018-04-11 DIAGNOSIS — I5032 Chronic diastolic (congestive) heart failure: Secondary | ICD-10-CM | POA: Insufficient documentation

## 2018-04-11 DIAGNOSIS — R109 Unspecified abdominal pain: Secondary | ICD-10-CM | POA: Insufficient documentation

## 2018-04-11 DIAGNOSIS — I252 Old myocardial infarction: Secondary | ICD-10-CM | POA: Insufficient documentation

## 2018-04-11 DIAGNOSIS — J45909 Unspecified asthma, uncomplicated: Secondary | ICD-10-CM | POA: Insufficient documentation

## 2018-04-11 DIAGNOSIS — E119 Type 2 diabetes mellitus without complications: Secondary | ICD-10-CM | POA: Diagnosis not present

## 2018-04-11 LAB — URINALYSIS, ROUTINE W REFLEX MICROSCOPIC
Bilirubin Urine: NEGATIVE
Hgb urine dipstick: NEGATIVE
Ketones, ur: 5 mg/dL — AB
Nitrite: NEGATIVE
Protein, ur: NEGATIVE mg/dL
Specific Gravity, Urine: 1.031 — ABNORMAL HIGH (ref 1.005–1.030)
pH: 5 (ref 5.0–8.0)

## 2018-04-11 MED ORDER — METHOCARBAMOL 500 MG PO TABS: 500.0000 mg | ORAL_TABLET | Freq: Three times a day (TID) | ORAL | 0 refills | Status: DC | PRN

## 2018-04-11 MED ORDER — DEXAMETHASONE SODIUM PHOSPHATE 10 MG/ML IJ SOLN
10.0000 mg | Freq: Once | INTRAMUSCULAR | Status: AC
  Administered 2018-04-11: 10 mg via INTRAMUSCULAR
  Filled 2018-04-11: qty 1

## 2018-04-11 MED ORDER — LIDOCAINE 5 % EX OINT: 1.0000 "application " | TOPICAL_OINTMENT | Freq: Three times a day (TID) | CUTANEOUS | 0 refills | Status: DC | PRN

## 2018-04-11 MED ORDER — KETOROLAC TROMETHAMINE 30 MG/ML IJ SOLN
30.0000 mg | Freq: Once | INTRAMUSCULAR | Status: AC
  Administered 2018-04-11: 30 mg via INTRAMUSCULAR
  Filled 2018-04-11: qty 1

## 2018-04-11 NOTE — ED Triage Notes (Signed)
Patient c/o right lower back pain since 0500 today. Patient denies pain radiating down the right leg. Patient states she has taken OTC pain meds and back rubs with no relief.  Patient denies any urinary frequency or dysuria.

## 2018-04-11 NOTE — Discharge Instructions (Signed)

## 2018-04-11 NOTE — ED Provider Notes (Signed)
Emergency Department Provider Note   I have reviewed the triage vital signs and the nursing notes.   HISTORY  Chief Complaint Back Pain   HPI Rebecca Malone is a 53 y.o. female with PMH of asthma, GERD, HTN, presents to the emergency department for evaluation of right lower back pain.  Pain is in the right buttock area and radiating slightly anteriorly.  No pain into the right leg.  No leg numbness or weakness.  No bowel/bladder incontinence.  No fevers or chills.  Patient denies any injury.  Symptoms began abruptly this morning and have been severe most of the day.  She has tried multiple over-the-counter pain medications with no relief in symptoms.  Denies any dysuria, hesitancy, urgency.  No vaginal bleeding or discharge.  Past Medical History:  Diagnosis Date  . Asthma   . Chest pain march 2014   admitted to hospital   . Degenerative arthritis    "knees" (03/07/2015)  . GERD (gastroesophageal reflux disease)   . History of blood transfusion 10/2009   "related to losing too much blood w/my period"  . Hypertension   . MI (myocardial infarction) [facility]) March 2014  . Obesity   . OSA on CPAP   . Pneumonia 2008  . Tachycardia march 2014   admitted to hospital  . Type II diabetes mellitus [facility])     Patient Active Problem List   Diagnosis Date Noted  . Diastolic CHF, acute ([place]) 09/02/2017  . HCVD (hypertensive cardiovascular disease) 09/02/2017  . Influenza A 03/07/2015  . Essential hypertension 06/09/2013  . Palpitations 06/07/2012  . Cigarette smoker 06/07/2012  . Acute asthma exacerbation 04/08/2012  . CAP (community acquired pneumonia) 04/08/2012  . SOB (shortness of breath) 04/08/2012  . Non-insulin treated type 2 diabetes mellitus ([place]) 09/11/2011  . Degenerative arthritis of knee 09/11/2011  . Neuropathy due to secondary diabetes ([place]) 09/11/2011  . Chest pain on exertion 09/09/2011  . Edema extremities 09/09/2011  . Anemia 09/09/2011  . OBESITY, MORBID  06/28/2008  . Cough variant asthma vs uacs 06/28/2008  . BACK PAIN, LUMBAR 06/28/2008    Past Surgical History:  Procedure Laterality Date  . CESAREAN SECTION  1999   Allergies Advair diskus [fluticasone-salmeterol]; Other; and Pork-derived products  Family History  Problem Relation Age of Onset  . Heart attack Father   . Diabetes Sister   . Diabetes Brother     Social History Social History   Tobacco Use  . Smoking status: Current Every Day Smoker    Packs/day: 0.35    Years: 20.00    Pack years: 7.00    Types: Cigarettes  . Smokeless tobacco: Never Used  Substance Use Topics  . Alcohol use: Yes  . Drug use: No    Review of Systems  Constitutional: No fever/chills Eyes: No visual changes. ENT: No sore throat. Cardiovascular: Denies chest pain. Respiratory: Denies shortness of breath. Gastrointestinal: No abdominal pain.  No nausea, no vomiting.  No diarrhea.  No constipation. Genitourinary: Negative for dysuria. Musculoskeletal: Positive right back pain.  Skin: Negative for rash. Neurological: Negative for headaches, focal weakness or numbness.  10-point ROS otherwise negative.  ____________________________________________   PHYSICAL EXAM:  VITAL SIGNS: ED Triage Vitals  Enc Vitals Group     BP 04/11/18 1829 (!) 181/87     Pulse Rate 04/11/18 1829 69     Resp 04/11/18 1829 16     Temp 04/11/18 1829 97.6 F (36.4 C)     Temp Source 04/11/18  1829 Oral     SpO2 04/11/18 1829 96 %     Weight 04/11/18 1832 221 lb (100.2 kg)     Height 04/11/18 1832 5\' 5"  (1.651 m)     Pain Score 04/11/18 1830 10   Constitutional: Alert and oriented. Well appearing and in no acute distress. Eyes: Conjunctivae are normal. Head: Atraumatic. Nose: No congestion/rhinnorhea. Mouth/Throat: Mucous membranes are moist.   Neck: No stridor.   Cardiovascular: Normal rate, regular rhythm. Good peripheral circulation. Grossly normal heart sounds.   Respiratory: Normal  respiratory effort.  No retractions. Lungs CTAB. Gastrointestinal: No distention.  Musculoskeletal: No lower extremity tenderness nor edema. No gross deformities of extremities. Tenderness in the right paraspinal region. Normal ROM of the right hip, knee, and ankle. No midline spine tenderness.  Neurologic:  Normal speech and language. No gross focal neurologic deficits are appreciated.  Skin:  Skin is warm, dry and intact. No rash noted. ____________________________________________   LABS (all labs ordered are listed, but only abnormal results are displayed)  Labs Reviewed  URINALYSIS, ROUTINE W REFLEX MICROSCOPIC - Abnormal; Notable for the following components:      Result Value   APPearance HAZY (*)    Specific Gravity, Urine 1.031 (*)    Glucose, UA >=500 (*)    Ketones, ur 5 (*)    Leukocytes,Ua TRACE (*)    Bacteria, UA RARE (*)    All other components within normal limits  URINE CULTURE   ____________________________________________  RADIOLOGY  Ct Renal Stone Study  Result Date: 04/11/2018 CLINICAL DATA:  Right low back pain and flank pain. EXAM: CT ABDOMEN AND PELVIS WITHOUT CONTRAST TECHNIQUE: Multidetector CT imaging of the abdomen and pelvis was performed following the standard protocol without IV contrast. COMPARISON:  11/18/2016 FINDINGS: Lower chest: Mild cardiomegaly. Linear areas of scarring in the lung bases. No effusions. Small hiatal hernia. Hepatobiliary: No focal hepatic abnormality. Gallbladder unremarkable. Pancreas: No focal abnormality or ductal dilatation. Spleen: No focal abnormality.  Normal size. Adrenals/Urinary Tract: No adrenal abnormality. No focal renal abnormality. No stones or hydronephrosis. Urinary bladder is unremarkable. Stomach/Bowel: Colonic diverticulosis diffusely. No active diverticulitis. Normal appendix. Stomach and small bowel decompressed, unremarkable. Vascular/Lymphatic: No evidence of aneurysm or adenopathy. Reproductive: Uterus is  enlarged with probable fibroid in the fundus. No adnexal masses. Other: No free fluid or free air. Musculoskeletal: No acute bony abnormality. IMPRESSION: No renal or ureteral stones.  No hydronephrosis. Views colonic diverticulosis.  No active diverticulitis. Fibroid uterus. Normal appendix. Small hiatal hernia. Electronically Signed   By: Rolm Baptise M.D.   On: 04/11/2018 20:33    ____________________________________________   PROCEDURES  Procedure(s) performed:   Procedures  None  ____________________________________________   INITIAL IMPRESSION / ASSESSMENT AND PLAN / ED COURSE  Pertinent labs & imaging results that were available during my care of the patient were reviewed by me and considered in my medical decision making (see chart for details).  Patient presents to the emergency department for evaluation of severe lower back pain.  No UTI symptoms or concern for pyelonephritis.  No vaginal bleeding or discharge.  No leg pain, weakness, numbness.  No evidence on exam of acute spine emergency.  Very low suspicion for septic arthritis in the right hip.  Plan for CT renal and UA.  CT renal read reviewed with no acute findings.  UA negative for infection.  Patient has been ambulatory in the emergency department with minimal symptoms although is developing some additional pain at the time of discharge.  Discussed providing Decadron prior to discharge.  Will send home with lidocaine ointment and Robaxin as needed.  Discussed that Robaxin might make her drowsy and she should not drive a car.  Provided contact information for her PCP, spine specialist, ED return precautions.  ____________________________________________  FINAL CLINICAL IMPRESSION(S) / ED DIAGNOSES  Final diagnoses:  Acute right-sided low back pain without sciatica     MEDICATIONS GIVEN DURING THIS VISIT:  Medications  dexamethasone (DECADRON) injection 10 mg (has no administration in time range)  ketorolac  (TORADOL) 30 MG/ML injection 30 mg (30 mg Intramuscular Given 04/11/18 1944)     NEW OUTPATIENT MEDICATIONS STARTED DURING THIS VISIT:  New Prescriptions   LIDOCAINE (XYLOCAINE) 5 % OINTMENT    Apply 1 application topically 3 (three) times daily as needed.   METHOCARBAMOL (ROBAXIN) 500 MG TABLET    Take 1 tablet (500 mg total) by mouth every 8 (eight) hours as needed for muscle spasms.    Note:  This document was prepared using Dragon voice recognition software and may include unintentional dictation errors.  Nanda Quinton, MD Emergency Medicine    Jeslyn Amsler, [facility], MD 04/11/18 2128

## 2018-04-12 LAB — URINE CULTURE
Culture: NO GROWTH
Special Requests: NORMAL

## 2018-04-13 ENCOUNTER — Emergency Department (HOSPITAL_COMMUNITY)
Admission: EM | Admit: 2018-04-13 | Discharge: 2018-04-13 | Disposition: A | Discharge status: Home / Self Care | Payer: Medicare Other | Attending: Emergency Medicine | Admitting: Emergency Medicine

## 2018-04-13 ENCOUNTER — Encounter (HOSPITAL_COMMUNITY): Payer: Self-pay | Admitting: Emergency Medicine

## 2018-04-13 ENCOUNTER — Other Ambulatory Visit: Payer: Self-pay

## 2018-04-13 DIAGNOSIS — Z7984 Long term (current) use of oral hypoglycemic drugs: Secondary | ICD-10-CM | POA: Diagnosis not present

## 2018-04-13 DIAGNOSIS — I5032 Chronic diastolic (congestive) heart failure: Secondary | ICD-10-CM | POA: Insufficient documentation

## 2018-04-13 DIAGNOSIS — E1140 Type 2 diabetes mellitus with diabetic neuropathy, unspecified: Secondary | ICD-10-CM | POA: Diagnosis not present

## 2018-04-13 DIAGNOSIS — Z7982 Long term (current) use of aspirin: Secondary | ICD-10-CM | POA: Diagnosis not present

## 2018-04-13 DIAGNOSIS — F17210 Nicotine dependence, cigarettes, uncomplicated: Secondary | ICD-10-CM | POA: Diagnosis not present

## 2018-04-13 DIAGNOSIS — I252 Old myocardial infarction: Secondary | ICD-10-CM | POA: Insufficient documentation

## 2018-04-13 DIAGNOSIS — M545 Low back pain: Secondary | ICD-10-CM | POA: Diagnosis present

## 2018-04-13 DIAGNOSIS — Z79899 Other long term (current) drug therapy: Secondary | ICD-10-CM | POA: Diagnosis not present

## 2018-04-13 DIAGNOSIS — J45909 Unspecified asthma, uncomplicated: Secondary | ICD-10-CM | POA: Diagnosis not present

## 2018-04-13 DIAGNOSIS — I110 Hypertensive heart disease with heart failure: Secondary | ICD-10-CM | POA: Insufficient documentation

## 2018-04-13 DIAGNOSIS — M5441 Lumbago with sciatica, right side: Secondary | ICD-10-CM

## 2018-04-13 MED ORDER — LIDOCAINE 5 % EX PTCH: 1.0000 | MEDICATED_PATCH | CUTANEOUS | 0 refills | Status: DC

## 2018-04-13 MED ORDER — KETOROLAC TROMETHAMINE 15 MG/ML IJ SOLN
15.0000 mg | Freq: Once | INTRAMUSCULAR | Status: AC
  Administered 2018-04-13: 15 mg via INTRAMUSCULAR
  Filled 2018-04-13: qty 1

## 2018-04-13 MED ORDER — CYCLOBENZAPRINE HCL 5 MG PO TABS: 5.0000 mg | ORAL_TABLET | Freq: Two times a day (BID) | ORAL | 0 refills | Status: AC | PRN

## 2018-04-13 NOTE — ED Provider Notes (Signed)
[facility] EMERGENCY DEPARTMENT Provider Note   CSN: [id number] Arrival date & time: 04/13/18  1629    History   Chief Complaint Chief Complaint  Patient presents with  . Back Pain    HPI Rebecca Malone is a 53 y.o. female present day for right lower back pain.  Patient reports pain to her right lower back/buttock that has been present since Sunday.  Patient believes that she slept incorrectly as when she woke up Sunday morning she first noticed the pain.  Describes pain as a constant sharp throb worsened with movement and palpation and slightly relieved with rest.  Patient states that pain occasionally radiates down her lateral right thigh.  Patient denies any injury or trauma to the area, she denies fever/chills, dysuria/hematuria, IV drug use, history of cancer, bowel/bladder incontinence, saddle area paresthesias, numbness/tingling or weakness of the extremities, personal/family history of kidney stones or any other complaints.  Of note patient was seen and evaluated on 04/11/2018 for the same complaint at that time patient had CT renal stone study and urinalysis/urine culture performed. ================================== CT RENAL: IMPRESSION: No renal or ureteral stones. No hydronephrosis.  Views colonic diverticulosis. No active diverticulitis.  Fibroid uterus.  Normal appendix.  Small hiatal hernia. ================================== Urinalysis with glucose and appears contaminated, urine culture without growth after 2 days - Patient was diagnosed with acute right-sided low back pain without sciatica.  Patient was given Toradol 30 mg as well as dexamethasone.  Patient reports that she had relief for approximately 12-18 hours following these medications however pain returned thereafter.  She has been using Robaxin 500 mg 3 times daily without relief since that time.  Of note patient has scheduled herself a follow-up appointment with neurosurgery for  04/20/2018.  Patient is requesting pain medication today to last her until her appointment.     HPI  Past Medical History:  Diagnosis Date  . Asthma   . Chest pain march 2014   admitted to hospital   . Degenerative arthritis    "knees" (03/07/2015)  . GERD (gastroesophageal reflux disease)   . History of blood transfusion 10/2009   "related to losing too much blood w/my period"  . Hypertension   . MI (myocardial infarction) [facility]) March 2014  . Obesity   . OSA on CPAP   . Pneumonia 2008  . Tachycardia march 2014   admitted to hospital  . Type II diabetes mellitus [facility])     Patient Active Problem List   Diagnosis Date Noted  . Diastolic CHF, acute ([place]) 09/02/2017  . HCVD (hypertensive cardiovascular disease) 09/02/2017  . Influenza A 03/07/2015  . Essential hypertension 06/09/2013  . Palpitations 06/07/2012  . Cigarette smoker 06/07/2012  . Acute asthma exacerbation 04/08/2012  . CAP (community acquired pneumonia) 04/08/2012  . SOB (shortness of breath) 04/08/2012  . Non-insulin treated type 2 diabetes mellitus ([place]) 09/11/2011  . Degenerative arthritis of knee 09/11/2011  . Neuropathy due to secondary diabetes ([place]) 09/11/2011  . Chest pain on exertion 09/09/2011  . Edema extremities 09/09/2011  . Anemia 09/09/2011  . OBESITY, MORBID 06/28/2008  . Cough variant asthma vs uacs 06/28/2008  . BACK PAIN, LUMBAR 06/28/2008    Past Surgical History:  Procedure Laterality Date  . CESAREAN SECTION  1999     OB History   No obstetric history on file.      Home Medications    Prior to Admission medications   Medication Sig Start Date End Date Taking? Authorizing Provider  albuterol (PROVENTIL HFA;VENTOLIN HFA) 108 (90 Base) MCG/ACT inhaler Inhale 2 puffs into the lungs every 6 (six) hours as needed for shortness of breath. For asthma 10/13/17  Yes Tanda Rockers, MD  albuterol (PROVENTIL) (2.5 MG/3ML) 0.083% nebulizer solution Take 3 mLs (2.5 mg total) by  nebulization every 6 (six) hours as needed for wheezing or shortness of breath. 10/13/17  Yes Tanda Rockers, MD  aspirin EC 81 MG EC tablet Take 1 tablet (81 mg total) by mouth daily. 04/09/12  Yes Laza, Sorin C, MD  bisoprolol (ZEBETA) 5 MG tablet One twice daily Patient taking differently: Take 5 mg by mouth daily.  09/03/17  Yes Tanda Rockers, MD  budesonide-formoterol (SYMBICORT) 80-4.5 MCG/ACT inhaler Take 2 puffs first thing in am and then another 2 puffs about 12 hours later. 09/03/17  Yes Tanda Rockers, MD  diclofenac sodium (VOLTAREN) 1 % GEL Apply 4 g topically 4 (four) times daily as needed (knee pain).    Yes [provider]  famotidine (PEPCID) 20 MG tablet One at bedtime Patient taking differently: Take 20 mg by mouth at bedtime.  09/03/17  Yes Tanda Rockers, MD  losartan-hydrochlorothiazide (HYZAAR) 50-12.5 MG tablet Take 1 tablet by mouth daily. 09/03/17  Yes Tanda Rockers, MD  metFORMIN (GLUCOPHAGE) 500 MG tablet Take 2 tablets (1,000 mg total) by mouth 2 (two) times daily with a meal. 09/01/17 04/13/18 Yes Fawze, Mina A, PA-C  naproxen sodium (ALEVE) 220 MG tablet Take 440 mg by mouth daily as needed (pain).   Yes [provider]  pantoprazole (PROTONIX) 40 MG tablet Take 1 tablet (40 mg total) by mouth daily. Take 30-60 min before first meal of the day 09/03/17  Yes Tanda Rockers, MD  cyclobenzaprine (FLEXERIL) 5 MG tablet Take 1 tablet (5 mg total) by mouth 2 (two) times daily as needed for up to 7 days for muscle spasms. 04/13/18 04/20/18  Nuala Alpha A, PA-C  hydrochlorothiazide (HYDRODIURIL) 12.5 MG tablet Take 1 tablet (12.5 mg total) by mouth daily. Patient not taking: Reported on 04/13/2018 12/03/17   Tanda Rockers, MD  HYDROcodone-acetaminophen [facility]) 10-325 MG tablet Take 1 tablet by mouth every 6 (six) hours as needed.    [provider]  lidocaine (LIDODERM) 5 % Place 1 patch onto the skin daily. Remove & Discard patch within 12 hours or as  directed by MD 04/13/18   Nuala Alpha A, PA-C  losartan (COZAAR) 50 MG tablet Take 1 tablet (50 mg total) by mouth daily. Patient not taking: Reported on 04/13/2018 12/03/17   Tanda Rockers, MD  predniSONE (DELTASONE) 20 MG tablet Take 2 tablets (40 mg total) by mouth daily. Patient not taking: Reported on 04/13/2018 02/05/18   Ward, Ozella Almond, PA-C    Family History Family History  Problem Relation Age of Onset  . Heart attack Father   . Diabetes Sister   . Diabetes Brother     Social History Social History   Tobacco Use  . Smoking status: Current Every Day Smoker    Packs/day: 0.35    Years: 20.00    Pack years: 7.00    Types: Cigarettes  . Smokeless tobacco: Never Used  Substance Use Topics  . Alcohol use: Yes  . Drug use: No     Allergies   Advair diskus [fluticasone-salmeterol]; Other; and Pork-derived products   Review of Systems Review of Systems  Constitutional: Negative.  Negative for chills and fever.  Eyes: Negative.  Negative  for visual disturbance.  Respiratory: Negative.  Negative for cough and shortness of breath.   Cardiovascular: Negative.  Negative for chest pain.  Gastrointestinal: Negative.  Negative for abdominal pain, nausea and vomiting.  Genitourinary: Negative.  Negative for dysuria, flank pain and hematuria.  Musculoskeletal: Positive for back pain. Negative for neck pain.  Neurological: Negative.  Negative for syncope, weakness, numbness and headaches.   Physical Exam Updated Vital Signs BP (!) 190/79 (BP Location: Right Arm)   Pulse 60   Temp 99.7 F (37.6 C) (Oral)   Resp 20   Ht 5\' 5"  (1.651 m)   Wt 100.2 kg   LMP 09/30/2012   SpO2 100%   BMI 36.78 kg/m   Physical Exam Constitutional:      General: She is not in acute distress.    Appearance: Normal appearance. She is not ill-appearing or diaphoretic.  HENT:     Head: Normocephalic and atraumatic. No raccoon eyes or Battle's sign.     Jaw: There is normal jaw  occlusion. No trismus.     Right Ear: Tympanic membrane, ear canal and external ear normal. No hemotympanum.     Left Ear: Tympanic membrane, ear canal and external ear normal. No hemotympanum.     Nose: Nose normal.     Mouth/Throat:     Lips: Pink.     Mouth: Mucous membranes are moist.     Pharynx: Oropharynx is clear. Uvula midline.  Eyes:     General: Vision grossly intact. Gaze aligned appropriately.     Extraocular Movements: Extraocular movements intact.     Conjunctiva/sclera: Conjunctivae normal.     Pupils: Pupils are equal, round, and reactive to light.  Neck:     Musculoskeletal: Normal range of motion and neck supple. No neck rigidity.     Trachea: Trachea and phonation normal. No tracheal tenderness or tracheal deviation.  Cardiovascular:     Rate and Rhythm: Normal rate and regular rhythm.     Pulses:          Radial pulses are 2+ on the right side and 2+ on the left side.       Dorsalis pedis pulses are 2+ on the right side and 2+ on the left side.       Posterior tibial pulses are 2+ on the right side and 2+ on the left side.     Heart sounds: Normal heart sounds.  Pulmonary:     Effort: Pulmonary effort is normal. No respiratory distress.     Breath sounds: Normal breath sounds and air entry. No decreased breath sounds or rhonchi.  Abdominal:     General: Bowel sounds are normal. There is no distension.     Palpations: Abdomen is soft. There is no pulsatile mass.     Tenderness: There is no abdominal tenderness. There is no right CVA tenderness, left CVA tenderness, guarding or rebound.  Musculoskeletal:       Back:     Comments: No midline C/T/L spinal tenderness to palpation no deformity, crepitus, or step-off noted. No sign of injury to the neck or back.  Consistently reproducible tenderness to palpation of the right gluteus musculature.  As of straight leg test right-side.   Feet:     Right foot:     Protective Sensation: 5 sites tested. 5 sites sensed.      Left foot:     Protective Sensation: 5 sites tested. 5 sites sensed.  Skin:    General: Skin is warm  and dry.     Capillary Refill: Capillary refill takes less than 2 seconds.  Neurological:     Mental Status: She is alert.     GCS: GCS eye subscore is 4. GCS verbal subscore is 5. GCS motor subscore is 6.     Comments: Speech is clear and goal oriented, follows commands Major Cranial nerves without deficit, no facial droop Normal strength in upper and lower extremities bilaterally including dorsiflexion and plantar flexion, strong and equal grip strength Sensation normal to light touch DTR 2+ bilaterally patella, no clonus of the feet Moves extremities without ataxia, coordination intact Normal gait  Psychiatric:        Behavior: Behavior is cooperative.    ED Treatments / Results  Labs (all labs ordered are listed, but only abnormal results are displayed) Labs Reviewed - No data to display  EKG None  Radiology Ct Renal Stone Study  Result Date: 04/11/2018 CLINICAL DATA:  Right low back pain and flank pain. EXAM: CT ABDOMEN AND PELVIS WITHOUT CONTRAST TECHNIQUE: Multidetector CT imaging of the abdomen and pelvis was performed following the standard protocol without IV contrast. COMPARISON:  11/18/2016 FINDINGS: Lower chest: Mild cardiomegaly. Linear areas of scarring in the lung bases. No effusions. Small hiatal hernia. Hepatobiliary: No focal hepatic abnormality. Gallbladder unremarkable. Pancreas: No focal abnormality or ductal dilatation. Spleen: No focal abnormality.  Normal size. Adrenals/Urinary Tract: No adrenal abnormality. No focal renal abnormality. No stones or hydronephrosis. Urinary bladder is unremarkable. Stomach/Bowel: Colonic diverticulosis diffusely. No active diverticulitis. Normal appendix. Stomach and small bowel decompressed, unremarkable. Vascular/Lymphatic: No evidence of aneurysm or adenopathy. Reproductive: Uterus is enlarged with probable fibroid in the  fundus. No adnexal masses. Other: No free fluid or free air. Musculoskeletal: No acute bony abnormality. IMPRESSION: No renal or ureteral stones.  No hydronephrosis. Views colonic diverticulosis.  No active diverticulitis. Fibroid uterus. Normal appendix. Small hiatal hernia. Electronically Signed   By: Rolm Baptise M.D.   On: 04/11/2018 20:33    Procedures Procedures (including critical care time)  Medications Ordered in ED Medications  ketorolac (TORADOL) 15 MG/ML injection 15 mg (15 mg Intramuscular Given 04/13/18 1801)     Initial Impression / Assessment and Plan / ED Course  I have reviewed the triage vital signs and the nursing notes.  Pertinent labs & imaging results that were available during my care of the patient were reviewed by me and considered in my medical decision making (see chart for details).    53 year old female presenting today for right-sided low back pain with sciatica.  Rebecca Malone is a 53 y.o. female presenting with right-sided back pain.  Pain is consistently reproducible with light palpation of the right gluteus musculature and right straight leg raise. Patient denies history of trauma, fever, IV drug use, night sweats, weight loss, cancer, saddle anesthesia, urinary rentention, bowel/bladder incontinence. No neurological deficits and normal neuro exam.  Patient denies any urinary symptoms.  Patient with CT renal 2 days ago without acute findings.  Suspect musculoskeletal etiology of patient's pain. Pain is consistently reproducible with palpation of the right gluteal musculature and with straight leg raise. Abdomen soft/nontender and without pulsatile mass. Patient with equal pedal pulses. Doubt spinal epidural abscess, cauda equina or AAA.  Patient without urinary symptoms, doubt nephrolithiasis or pyelonephritis.  Imaging not indicated at this time.  Patient is ambulatory in the emergency department without assistance. RICE protocol and pain medicine  indicated and discussed with patient.  Patient encouraged to maintain her neurosurgery appointment  for further evaluation of her low back pain with sciatica.  15 mg IM Toradol given today.  Patient denies history of CKD or gastric ulcers/bleeding. Patient has been changed to Flexeril today per her request, I have informed her to stop taking her Robaxin.  Patient states understanding to switch to Flexeril from Robaxin. Patient informed to avoid driving or operating heavy machinery while taking muscle relaxer. - Patient reassessed approximately 30 minutes after receiving her Toradol injection.  She states great improvement in her pain and is now requesting discharge.  Patient without any further complaints or concerns. -  Additionally patient noted to be hypertensive today, she is asymptomatic regarding this.  Patient states that she has her blood pressure medication at home however has not taken it today.  I have encouraged patient to take her blood pressure medication as prescribed.  I have informed patient to follow-up with her primary care provider within the next week for blood pressure recheck and medication management.  Patient informed of the signs and symptoms of hypertensive urgency/emergency and return to emergency department immediately if these occur.  At this time there does not appear to be any evidence of an acute emergency medical condition and the patient appears stable for discharge with appropriate outpatient follow up. Diagnosis was discussed with patient who verbalizes understanding of care plan and is agreeable to discharge. I have discussed return precautions with patient who verbalizes understanding of return precautions. Patient encouraged to follow-up with their PCP. All questions answered.  Patient has been discharged in good condition.   Note: Portions of this report may have been transcribed using voice recognition software. Every effort was made to ensure accuracy; however,  inadvertent computerized transcription errors may still be present. Final Clinical Impressions(s) / ED Diagnoses   Final diagnoses:  Acute right-sided low back pain with right-sided sciatica    ED Discharge Orders         Ordered    lidocaine (LIDODERM) 5 %  Every 24 hours     04/13/18 1841    cyclobenzaprine (FLEXERIL) 5 MG tablet  2 times daily PRN     04/13/18 1841           Deliah Boston, PA-C 04/13/18 1917    Hayden Rasmussen, MD 04/13/18 2350

## 2018-04-13 NOTE — Discharge Instructions (Signed)
You have been diagnosed today with Right Sided Lower Back Pain With Right Sided Sciatica.  At this time there does not appear to be the presence of an emergent medical condition, however there is always the potential for conditions to change. Please read and follow the below instructions.  Please return to the Emergency Department immediately for any new or worsening symptoms. Please be sure to follow up with your Primary Care Provider within one week regarding your visit today; please call their office to schedule an appointment even if you are feeling better for a follow-up visit. Please go to your appointment on 04/20/2018 with your neurosurgeon to further discuss your back pain and sciatica. You may use the new muscle relaxer Flexeril as prescribed 2 times daily.  Stop taking your Robaxin.  You cannot take both of these medications together as it will be dangerous.  Do not drive or operate heavy machinery while taking Flexeril as it will make you drowsy. You have been given an anti-inflammatory shot today called Toradol.  Please avoid further NSAID medication such as ibuprofen/Motrin for the next 2 days.  Please drink plenty water. You may replace the lidocaine gel with the Lidoderm patch to help with your symptoms.  Do not use both at the same time.  Get help right away if: You develop new bowel or bladder control problems. You have unusual weakness or numbness in your arms or legs. You develop nausea or vomiting. You develop abdominal pain. You feel faint. Get help right away if: You cannot control when you pee (urinate) or poop (have a bowel movement). You have weakness in any of these areas and it gets worse. Lower back. Lower belly (pelvis). Butt (buttocks). Legs. You have redness or swelling of your back. You have a burning feeling when you pee. Any new or concerning symptoms  Please read the additional information packets attached to your discharge summary.  Do not take your  medicine if  develop an itchy rash, swelling in your mouth or lips, or difficulty breathing.

## 2018-04-13 NOTE — ED Triage Notes (Signed)
Pt complains of right sided flank pain. Pt states she feels like she has pins sticking her. Pt has been having pain since Sunday

## 2018-04-13 NOTE — ED Notes (Signed)
Patient verbalizes understanding of discharge instructions. Opportunity for questioning and answers were provided. Armband removed by staff, pt discharged from ED ambulatory to home.  

## 2018-08-22 ENCOUNTER — Other Ambulatory Visit: Payer: Self-pay | Admitting: Internal Medicine

## 2018-09-12 ENCOUNTER — Other Ambulatory Visit: Payer: Self-pay | Admitting: Physician Assistant

## 2018-09-12 ENCOUNTER — Encounter: Payer: Self-pay | Admitting: Physician Assistant

## 2018-09-12 ENCOUNTER — Other Ambulatory Visit: Payer: Self-pay

## 2018-09-12 ENCOUNTER — Ambulatory Visit (INDEPENDENT_AMBULATORY_CARE_PROVIDER_SITE_OTHER): Payer: Medicare Other | Admitting: Physician Assistant

## 2018-09-12 VITALS — BP 179/86 | HR 73 | Ht 66.0 in | Wt 231.8 lb

## 2018-09-12 DIAGNOSIS — R002 Palpitations: Secondary | ICD-10-CM

## 2018-09-12 DIAGNOSIS — Z79899 Other long term (current) drug therapy: Secondary | ICD-10-CM

## 2018-09-12 DIAGNOSIS — I10 Essential (primary) hypertension: Secondary | ICD-10-CM | POA: Diagnosis not present

## 2018-09-12 DIAGNOSIS — E119 Type 2 diabetes mellitus without complications: Secondary | ICD-10-CM

## 2018-09-12 DIAGNOSIS — G4733 Obstructive sleep apnea (adult) (pediatric): Secondary | ICD-10-CM

## 2018-09-12 DIAGNOSIS — J45991 Cough variant asthma: Secondary | ICD-10-CM

## 2018-09-12 DIAGNOSIS — R5383 Other fatigue: Secondary | ICD-10-CM

## 2018-09-12 LAB — CBC
Hematocrit: 40.4 % (ref 34.0–46.6)
Hemoglobin: 13 g/dL (ref 11.1–15.9)
MCH: 27.3 pg (ref 26.6–33.0)
MCHC: 32.2 g/dL (ref 31.5–35.7)
MCV: 85 fL (ref 79–97)
Platelets: 245 10*3/uL (ref 150–450)
RBC: 4.76 x10E6/uL (ref 3.77–5.28)
RDW: 12.1 % (ref 11.7–15.4)
WBC: 5.2 10*3/uL (ref 3.4–10.8)

## 2018-09-12 LAB — COMPREHENSIVE METABOLIC PANEL
ALT: 11 IU/L (ref 0–32)
AST: 16 IU/L (ref 0–40)
Albumin/Globulin Ratio: 1.6 (ref 1.2–2.2)
Albumin: 4.2 g/dL (ref 3.8–4.9)
Alkaline Phosphatase: 104 IU/L (ref 39–117)
BUN/Creatinine Ratio: 12 (ref 9–23)
BUN: 11 mg/dL (ref 6–24)
Bilirubin Total: 0.5 mg/dL (ref 0.0–1.2)
CO2: 20 mmol/L (ref 20–29)
Calcium: 9.2 mg/dL (ref 8.7–10.2)
Chloride: 104 mmol/L (ref 96–106)
Creatinine, Ser: 0.9 mg/dL (ref 0.57–1.00)
GFR calc Af Amer: 84 mL/min/{1.73_m2} (ref >59)
GFR calc non Af Amer: 73 mL/min/{1.73_m2} (ref >59)
Globulin, Total: 2.6 g/dL (ref 1.5–4.5)
Glucose: 189 mg/dL — ABNORMAL HIGH (ref 65–99)
Potassium: 4 mmol/L (ref 3.5–5.2)
Sodium: 140 mmol/L (ref 134–144)
Total Protein: 6.8 g/dL (ref 6.0–8.5)

## 2018-09-12 LAB — LIPID PANEL
Chol/HDL Ratio: 3.3 ratio (ref 0.0–4.4)
Cholesterol, Total: 175 mg/dL (ref 100–199)
HDL: 53 mg/dL (ref >39)
LDL Calculated: 106 mg/dL — ABNORMAL HIGH (ref 0–99)
Triglycerides: 79 mg/dL (ref 0–149)
VLDL Cholesterol Cal: 16 mg/dL (ref 5–40)

## 2018-09-12 LAB — HEMOGLOBIN A1C
Est. average glucose Bld gHb Est-mCnc: 186 mg/dL
Hgb A1c MFr Bld: 8.1 % — ABNORMAL HIGH (ref 4.8–5.6)

## 2018-09-12 LAB — TSH: TSH: 2.97 u[IU]/mL (ref 0.450–4.500)

## 2018-09-12 MED ORDER — BUDESONIDE-FORMOTEROL FUMARATE 80-4.5 MCG/ACT IN AERO: INHALATION_SPRAY | RESPIRATORY_TRACT | 2 refills | Status: DC

## 2018-09-12 MED ORDER — BISOPROLOL FUMARATE 10 MG PO TABS: 10.0000 mg | ORAL_TABLET | Freq: Every day | ORAL | 1 refills | Status: DC

## 2018-09-12 MED ORDER — LOSARTAN POTASSIUM-HCTZ 50-12.5 MG PO TABS: 1.0000 | ORAL_TABLET | Freq: Every day | ORAL | 11 refills | Status: DC

## 2018-09-12 MED ORDER — ALBUTEROL SULFATE (2.5 MG/3ML) 0.083% IN NEBU: 2.5000 mg | INHALATION_SOLUTION | Freq: Four times a day (QID) | RESPIRATORY_TRACT | 2 refills | Status: DC | PRN

## 2018-09-12 MED ORDER — METFORMIN HCL 1000 MG PO TABS: 1000.0000 mg | ORAL_TABLET | Freq: Two times a day (BID) | ORAL | 0 refills | Status: DC

## 2018-09-12 MED ORDER — PANTOPRAZOLE SODIUM 40 MG PO TBEC: 40.0000 mg | DELAYED_RELEASE_TABLET | Freq: Every day | ORAL | 0 refills | Status: DC

## 2018-09-12 MED ORDER — AMLODIPINE BESYLATE 5 MG PO TABS: 5.0000 mg | ORAL_TABLET | Freq: Every day | ORAL | 0 refills | Status: DC

## 2018-09-12 MED ORDER — BISOPROLOL FUMARATE 10 MG PO TABS: 10.0000 mg | ORAL_TABLET | Freq: Two times a day (BID) | ORAL | 0 refills | Status: DC

## 2018-09-12 MED ORDER — FAMOTIDINE 20 MG PO TABS: 20.0000 mg | ORAL_TABLET | Freq: Every day | ORAL | 0 refills | Status: DC

## 2018-09-12 NOTE — Progress Notes (Signed)
Cardiology Office Note    Date:  09/12/2018   ID:  Rebecca Malone, DOB 1965/11/09, MRN [id number]  PCP:  System, Pcp Not In  Cardiologist: Dr. Sallyanne Kuster  Chief Complaint  Patient presents with  . Follow-up    seen for Dr. Sallyanne Kuster, annual visit    History of Present Illness:  Rebecca Malone is a 53 y.o. female with PMH of GERD, hypertension, obstructive sleep apnea, tobacco abuse, DM 2, and palpitations.  She had a normal nuclear stress test in March 2014.  Patient underwent sleep study in 2016 that was positive for obstructive sleep apnea and was placed on CPAP machine.  She was never able to complete CPAP titration.  Previous 48-hour Holter monitor obtained on 08/18/2017 showed baseline sinus rhythm with very rare PVCs and PACs and single 3 beat episode of atrial tachycardia, otherwise no significant bradycardic event or irregular rhythm.  Echocardiogram obtained on 09/13/2017 showed EF 55 to 60%, grade 1 DD, moderate TR, PA peak pressure 41 mmHg.  Patient was last seen by Kerin Ransom, PA-C on 09/02/2017, she did have improvement in her palpitation after placed on diltiazem however continued to have almost daily episodes of palpitation.  A longer 30-day event monitor was obtained on 09/13/2017, that showed sinus rhythm, rare PVCs, no evidence of atrial fibrillation or significant bradycardia.  Although there were several manually triggered event, all of those recorded sinus rhythm.  There was no correlation between her subjective complaints and underlying rhythm.  She presents today for annual follow-up, her blood pressure is very high at 179/86.  She says it is not unusual for her systolic blood pressure to be in the 190s to 220s range.  She has headache, dizzy spell, fatigue and seeing floaters.  2 weeks ago, she had an episode where she woke up feeling weak on the right side.  Even though she says she still has some weakness on the right side, however on physical exam, the muscular strength  is similar bilaterally.  I asked her to discuss this with her primary care doctor.  I did not seek immediate work-up since the episode occurred 2 weeks ago and that there is no noticeable difference between the strength on both side of the upper extremity and lower lower extremity.  She brought all of her medication bottles.  There is several duplicate medications.  She is on both losartan-hydrochlorothiazide and also lisinopril-hydrochlorothiazide.  She is also on both bisoprolol and metoprolol.  I discontinued both metoprolol and lisinopril-hydrochlorothiazide.  I also increased bisoprolol to 10 mg twice daily and added amlodipine 5 mg daily.  She will return in 2 weeks to follow-up with our hypertension clinic.  I will obtain standard lab work to include a CMP, CBC, TSH, hgb A1C and fasting lipid panel.  She can follow-up with Dr. Sallyanne Kuster in 3 months.  Otherwise she has no lower extremity edema, orthopnea or PND.  She denies any recent chest discomfort, however she does have occasional palpitation episode.  The increased dose of bisoprolol likely will help with palpitation.  However her palpitation is likely subjective given lack of supportive underlying irregular rhythm on the previous heart monitor.  She does not know the name of her primary care provider and has not had any recent lab work.   Past Medical History:  Diagnosis Date  . Asthma   . Chest pain march 2014   admitted to hospital   . Degenerative arthritis    "knees" (03/07/2015)  . GERD (gastroesophageal  reflux disease)   . History of blood transfusion 10/2009   "related to losing too much blood w/my period"  . Hypertension   . MI (myocardial infarction) [facility]) March 2014  . Obesity   . OSA on CPAP   . Pneumonia 2008  . Tachycardia march 2014   admitted to hospital  . Type II diabetes mellitus [facility])     Past Surgical History:  Procedure Laterality Date  . CESAREAN SECTION  1999    Current Medications: Outpatient Medications  Prior to Visit  Medication Sig Dispense Refill  . albuterol (PROVENTIL HFA;VENTOLIN HFA) 108 (90 Base) MCG/ACT inhaler Inhale 2 puffs into the lungs every 6 (six) hours as needed for shortness of breath. For asthma 3 Inhaler 3  . albuterol (PROVENTIL) (2.5 MG/3ML) 0.083% nebulizer solution Take 3 mLs (2.5 mg total) by nebulization every 6 (six) hours as needed for wheezing or shortness of breath. 360 mL 5  . aspirin EC 81 MG EC tablet Take 1 tablet (81 mg total) by mouth daily.    . budesonide-formoterol (SYMBICORT) 80-4.5 MCG/ACT inhaler Take 2 puffs first thing in am and then another 2 puffs about 12 hours later. 1 Inhaler 12  . diclofenac sodium (VOLTAREN) 1 % GEL Apply 4 g topically 4 (four) times daily as needed (knee pain).     . famotidine (PEPCID) 20 MG tablet One at bedtime (Patient taking differently: Take 20 mg by mouth at bedtime. ) 30 tablet 11  . HYDROcodone-acetaminophen (NORCO) 10-325 MG tablet Take 1 tablet by mouth every 6 (six) hours as needed.    . lidocaine (LIDODERM) 5 % Place 1 patch onto the skin daily. Remove & Discard patch within 12 hours or as directed by MD 30 patch 0  . losartan-hydrochlorothiazide (HYZAAR) 50-12.5 MG tablet Take 1 tablet by mouth daily. 30 tablet 11  . metFORMIN (GLUCOPHAGE) 500 MG tablet Take 2 tablets (1,000 mg total) by mouth 2 (two) times daily with a meal. 120 tablet 0  . naproxen sodium (ALEVE) 220 MG tablet Take 440 mg by mouth daily as needed (pain).    . pantoprazole (PROTONIX) 40 MG tablet Take 1 tablet (40 mg total) by mouth daily. Take 30-60 min before first meal of the day 30 tablet 2  . predniSONE (DELTASONE) 20 MG tablet Take 2 tablets (40 mg total) by mouth daily. 8 tablet 0  . bisoprolol (ZEBETA) 5 MG tablet One twice daily (Patient taking differently: Take 5 mg by mouth daily. ) 60 tablet 11  . metoprolol succinate (TOPROL-XL) 100 MG 24 hr tablet Take 100 mg by mouth daily. Take with or immediately following a meal.    .  hydrochlorothiazide (HYDRODIURIL) 12.5 MG tablet Take 1 tablet (12.5 mg total) by mouth daily. (Patient not taking: Reported on 09/12/2018) 30 tablet 1  . losartan (COZAAR) 50 MG tablet Take 1 tablet (50 mg total) by mouth daily. (Patient not taking: Reported on 09/12/2018) 30 tablet 1   No facility-administered medications prior to visit.      Allergies:   Advair diskus [fluticasone-salmeterol], Other, and Pork-derived products   Social History   Socioeconomic History  . Marital status: Single    Spouse name: Not on file  . Number of children: Not on file  . Years of education: Not on file  . Highest education level: Not on file  Occupational History  . Not on file  Social Needs  . Financial resource strain: Not on file  . Food insecurity  Worry: Not on file    Inability: Not on file  . Transportation needs    Medical: Not on file    Non-medical: Not on file  Tobacco Use  . Smoking status: Current Every Day Smoker    Packs/day: 0.35    Years: 20.00    Pack years: 7.00    Types: Cigarettes  . Smokeless tobacco: Never Used  Substance and Sexual Activity  . Alcohol use: Yes  . Drug use: No  . Sexual activity: Not Currently  Lifestyle  . Physical activity    Days per week: Not on file    Minutes per session: Not on file  . Stress: Not on file  Relationships  . Social Herbalist on phone: Not on file    Gets together: Not on file    Attends religious service: Not on file    Active member of club or organization: Not on file    Attends meetings of clubs or organizations: Not on file    Relationship status: Not on file  Other Topics Concern  . Not on file  Social History Narrative  . Not on file     Family History:  The patient's family history includes Diabetes in her brother and sister; Heart attack in her father.   ROS:   Please see the history of present illness.    ROS All other systems reviewed and are negative.   PHYSICAL EXAM:   VS:  BP (!)  179/86   Pulse 73   Ht 5\' 6"  (1.676 m)   Wt 231 lb 12.8 oz (105.1 kg)   LMP 09/30/2012   BMI 37.41 kg/m    GEN: Well nourished, well developed, in no acute distress  HEENT: normal  Neck: no JVD, carotid bruits, or masses Cardiac: RRR; no murmurs, rubs, or gallops,no edema  Respiratory:  clear to auscultation bilaterally, normal work of breathing GI: soft, nontender, nondistended, + BS MS: no deformity or atrophy  Skin: warm and dry, no rash Neuro:  Alert and Oriented x 3, Strength and sensation are intact Psych: euthymic mood, full affect  Wt Readings from Last 3 Encounters:  09/12/18 231 lb 12.8 oz (105.1 kg)  04/13/18 221 lb (100.2 kg)  04/11/18 221 lb (100.2 kg)      Studies/Labs Reviewed:   EKG:  EKG is ordered today.  The ekg ordered today demonstrates normal sinus rhythm with diffuse ST depression in inferolateral leads, borderline LVH.  Recent Labs: No results found for requested labs within last 8760 hours.   Lipid Panel No results found for: CHOL, TRIG, HDL, CHOLHDL, VLDL, LDLCALC, LDLDIRECT  Additional studies/ records that were reviewed today include:   48 hours Holter 08/18/2016 Study Highlights    Baseline rhythm is normal sinus rhythm.  There are very rare PVCs and PACs and a single 3-beat episode of atrial tachycardia. There are no significant bradycardic events.  Rhythm is normal during symptom marked events.   Minimal abnormalities on 48 hour event monitor. No arrhythmia during symptoms.    Echo 09/13/2017 LV EF: 55% -   60% Study Conclusions  - Left ventricle: The cavity size was normal. Wall thickness was   normal. Systolic function was normal. The estimated ejection   fraction was in the range of 55% to 60%. Wall motion was normal;   there were no regional wall motion abnormalities. Doppler   parameters are consistent with abnormal left ventricular   relaxation (grade 1 diastolic dysfunction). - Tricuspid  valve: There was moderate  regurgitation. - Pulmonary arteries: Systolic pressure was mildly increased. PA   peak pressure: 41 mm Hg (S).   30 day event monitor 08/2017 Study Highlights    Baseline rhythm is sinus with normal circadian rhythm variation.  Rare PVCs are seen as well as a single ventricular couplet. There is no complex ventricular arrhythmia. No symptoms occurred during this time.  There is no evidence of atrial fibrillation.  There is no evidence of significant pauses or severe bradycardia.  The patient submitted several episodes of symptom driven recording (for "dizziness", "flutters", "shortness of breath", "chest pain". All of these recordings showed sinus rhythm.  The data is of good quality, with 29 days and 5 hours of recording during a 30-day event monitor.   Normal event monitor. There is no correlation between the patient's subjective complaints and the rhythm during that time.      ASSESSMENT:    1. Palpitations   2. Essential hypertension   3. OSA (obstructive sleep apnea)   4. Controlled type 2 diabetes mellitus without complication, without long-term current use of insulin ([place])   5. Fatigue, unspecified type   6. Medication management      PLAN:  In order of problems listed above:  1. Palpitation: She continued to have palpitation.  Previous heart monitor obtained in 2019 showed normal sinus rhythm despite actively having palpitation.  2. Hypertension: There is a lot of confusion between her medications.  She currently is on 4 different blood pressure medication include losartan-hydrochlorothiazide, lisinopril-hydrochlorothiazide, bisoprolol and metoprolol.  I discontinued her lisinopril-hydrochlorothiazide and metoprolol as they are duplicate when compared to the other 2 blood pressure medications..  I increased her bisoprolol to 10 mg twice daily and added amlodipine 5 mg daily.  3. Obstructive sleep apnea: She is only using the CPAP machine intermittently, I advised  her to use it on a daily basis.  4. DM2: Continue to follow-up with primary care provider.  Obtain hemoglobin A1c.  Previous lab work showed significant glucose leakage in the urine.  She is on metformin at home    Medication Adjustments/Labs and Tests Ordered: Current medicines are reviewed at length with the patient today.  Concerns regarding medicines are outlined above.  Medication changes, Labs and Tests ordered today are listed in the Patient Instructions below. Patient Instructions  Medication Instructions:   START Amlodipine 5 mg Daily  INCREASE Bisoprolol ( Zebeta) to 10 mg daily  STOP Metoprolol Succinate  STOP Lisinopril-HCTZ  If you need a refill on your cardiac medications before your next appointment, please call your pharmacy.   Lab work: You will need to have labs (blood work) drawn today:  CBC   CMET  A1c  Fasting Lipid Panel  TSH If you have labs (blood work) drawn today and your tests are completely normal, you will receive your results only by: Marland Kitchen MyChart Message (if you have MyChart) OR . A paper copy in the mail If you have any lab test that is abnormal or we need to change your treatment, we will call you to review the results.  Testing/Procedures: NONE ordered at this time of appointment    Follow-Up: At [facility], you and your health needs are our priority.  As part of our continuing mission to provide you with exceptional heart care, we have created designated Provider Care Teams.  These Care Teams include your primary Cardiologist (physician) and Advanced Practice Providers (APPs -  Physician Assistants and Nurse Practitioners) who all  work together to provide you with the care you need, when you need it.  Your physician recommends that you schedule a follow-up appointment in: 2 weeks with the Hypertension Clinic You will need a follow up appointment in 3 months.  Please call our office 2 months in advance to schedule this appointment.  You  may see Sanda Klein, MD or one of the following Advanced Practice Providers on your designated Care Team: [place], [place] . Fabian Sharp, PA-C  Any Other Special Instructions Will Be Listed Below (If Applicable).       Hilbert Corrigan, [place]  09/12/2018 9:31 AM    [address], [place],   [address] Phone: [phone]; Fax: [phone]

## 2018-09-12 NOTE — Addendum Note (Signed)
Addended by: Jacqulynn Cadet on: 09/12/2018 02:23 PM   Modules accepted: Orders

## 2018-09-12 NOTE — Patient Instructions (Addendum)
Medication Instructions:   START Amlodipine 5 mg Daily  INCREASE Bisoprolol ( Zebeta) to 10 mg 2 times a day  STOP Metoprolol Succinate  STOP Lisinopril-HCTZ  If you need a refill on your cardiac medications before your next appointment, please call your pharmacy.   Lab work: You will need to have labs (blood work) drawn today:  CBC   CMET  A1c  Fasting Lipid Panel  TSH If you have labs (blood work) drawn today and your tests are completely normal, you will receive your results only by: Marland Kitchen MyChart Message (if you have MyChart) OR . A paper copy in the mail If you have any lab test that is abnormal or we need to change your treatment, we will call you to review the results.  Testing/Procedures: NONE ordered at this time of appointment    Follow-Up: At [facility], you and your health needs are our priority.  As part of our continuing mission to provide you with exceptional heart care, we have created designated Provider Care Teams.  These Care Teams include your primary Cardiologist (physician) and Advanced Practice Providers (APPs -  Physician Assistants and Nurse Practitioners) who all work together to provide you with the care you need, when you need it.  Your physician recommends that you schedule a follow-up appointment in: 2 weeks with the Hypertension Clinic You will need a follow up appointment in 3 months.  Please call our office 2 months in advance to schedule this appointment.  You may see Sanda Klein, MD or one of the following Advanced Practice Providers on your designated Care Team: [place], [place] . Fabian Sharp, PA-C  Any Other Special Instructions Will Be Listed Below (If Applicable).

## 2018-09-13 NOTE — Progress Notes (Signed)
Thyroid ok. Cholesterol borderline high. Diet and exercise. Kidney function and electrolyte ok. Red blood cell count normal. Diabetes is better than 3 years ago, but still not very well controlled. Need to make sure she follows up with her primary care provider in the next several month

## 2018-09-14 ENCOUNTER — Other Ambulatory Visit: Payer: Self-pay | Admitting: Physician Assistant

## 2018-09-15 NOTE — Addendum Note (Signed)
Addended by: Venetia Maxon on: 09/15/2018 05:29 PM   Modules accepted: Orders

## 2018-09-16 NOTE — Telephone Encounter (Signed)
Pt's pharmacy is requesting a refill on famotidine. Would you like to refill this medication? Please address

## 2018-09-18 ENCOUNTER — Other Ambulatory Visit: Payer: Self-pay | Admitting: Internal Medicine

## 2018-10-01 ENCOUNTER — Other Ambulatory Visit: Payer: Self-pay | Admitting: Internal Medicine

## 2018-10-11 ENCOUNTER — Ambulatory Visit: Payer: Medicare Other

## 2018-10-11 NOTE — Progress Notes (Deleted)
Patient ID: VALORI NUR                 DOB: Nov 26, 1965                      MRN: [id number]     HPI: Rebecca Malone is a 54 y.o. female referred by Dr. Sallyanne Kuster to HTN clinic. PMH includes hypertension, OSA, tobacco abuse, DM-II, and palpitations. Duplicate therapy was noted during last office visit. Therapy was adjusted to amlodipine, bisoprolol, and losartan/HCTZ.  Patient presents today for HTN follow up.   Current HTN meds:  Amlodipine 5mg  daily Bisoprolol 10mg  daily Losartan/HCTZ 50-12.5mg  daily  Previously tried:   BP goal: <130/80  Family History:   Social History:   Diet:   Exercise:   Home BP readings:   Wt Readings from Last 3 Encounters:  09/12/18 231 lb 12.8 oz (105.1 kg)  04/13/18 221 lb (100.2 kg)  04/11/18 221 lb (100.2 kg)   BP Readings from Last 3 Encounters:  09/12/18 (!) 179/86  04/13/18 (!) 190/79  04/11/18 (!) 195/82   Pulse Readings from Last 3 Encounters:  09/12/18 73  04/13/18 60  04/11/18 80    Renal function: CrCl cannot be calculated (Patient's most recent lab result is older than the maximum 21 days allowed.).  Past Medical History:  Diagnosis Date  . Asthma   . Chest pain march 2014   admitted to hospital   . Degenerative arthritis    "knees" (03/07/2015)  . GERD (gastroesophageal reflux disease)   . History of blood transfusion 10/2009   "related to losing too much blood w/my period"  . Hypertension   . MI (myocardial infarction) [facility]) March 2014  . Obesity   . OSA on CPAP   . Pneumonia 2008  . Tachycardia march 2014   admitted to hospital  . Type II diabetes mellitus ([place])     Current Outpatient Medications on File Prior to Visit  Medication Sig Dispense Refill  . albuterol (PROVENTIL) (2.5 MG/3ML) 0.083% nebulizer solution Take 3 mLs (2.5 mg total) by nebulization every 6 (six) hours as needed for wheezing or shortness of breath. 1 time refill for patient send to PCP 360 mL 2  . amLODipine (NORVASC) 5 MG tablet  Take 1 tablet (5 mg total) by mouth daily. 90 tablet 0  . aspirin EC 81 MG EC tablet Take 1 tablet (81 mg total) by mouth daily.    . bisoprolol (ZEBETA) 10 MG tablet Take 1 tablet (10 mg total) by mouth 2 (two) times daily. 90 tablet 0  . budesonide-formoterol (SYMBICORT) 80-4.5 MCG/ACT inhaler Take 2 puffs first thing in am and then another 2 puffs about 12 hours later. 1 Inhaler 2  . diclofenac sodium (VOLTAREN) 1 % GEL Apply 4 g topically 4 (four) times daily as needed (knee pain).     . famotidine (PEPCID) 20 MG tablet TAKE 1 TABLET(20 MG) BY MOUTH AT BEDTIME 90 tablet 1  . HYDROcodone-acetaminophen (NORCO) 10-325 MG tablet Take 1 tablet by mouth every 6 (six) hours as needed.    . lidocaine (LIDODERM) 5 % Place 1 patch onto the skin daily. Remove & Discard patch within 12 hours or as directed by MD 30 patch 0  . losartan-hydrochlorothiazide (HYZAAR) 50-12.5 MG tablet Take 1 tablet by mouth daily. 30 tablet 11  . metFORMIN (GLUCOPHAGE) 1000 MG tablet Take 1 tablet (1,000 mg total) by mouth 2 (two) times daily with a meal. 60 tablet 0  .  naproxen sodium (ALEVE) 220 MG tablet Take 440 mg by mouth daily as needed (pain).    . pantoprazole (PROTONIX) 40 MG tablet Take 1 tablet (40 mg total) by mouth daily. Take 30-60 min before first meal of the day 30 tablet 0  . predniSONE (DELTASONE) 20 MG tablet Take 2 tablets (40 mg total) by mouth daily. 8 tablet 0   No current facility-administered medications on file prior to visit.     Allergies  Allergen Reactions  . Advair Diskus [Fluticasone-Salmeterol] Other (See Comments)    Made sores on the back of her throat  . Other Hives and Itching    Patient states she is allergic to all generic medications. Can only have [facility]  . Pork-Derived Products     Religious     Last menstrual period 09/30/2012.  No problem-specific Assessment & Plan notes found for this encounter.    [name] PharmD, BCPS, [place] [address] 10/11/2018 1:07 PM

## 2018-11-17 ENCOUNTER — Other Ambulatory Visit: Payer: Self-pay | Admitting: Cardiology

## 2018-12-08 ENCOUNTER — Other Ambulatory Visit: Payer: Self-pay | Admitting: Physician Assistant

## 2018-12-13 ENCOUNTER — Ambulatory Visit: Payer: Medicare Other | Admitting: Cardiovascular Disease

## 2019-03-30 ENCOUNTER — Telehealth: Payer: Self-pay

## 2019-03-30 ENCOUNTER — Telehealth: Payer: Medicare Other | Admitting: Medical

## 2019-03-30 NOTE — Telephone Encounter (Signed)
Called the number listed for the patient and a recording states "the wireless customer you are calling is not available, please try your call again later." I also called the number listed for her mother and left a voicemail stating that I was trying to get in contact with the patient for her virtual appointment with Roby Lofts, PA-C and to please give our office a call.

## 2019-05-23 ENCOUNTER — Other Ambulatory Visit: Payer: Self-pay | Admitting: Cardiovascular Disease

## 2019-05-23 DIAGNOSIS — M25531 Pain in right wrist: Secondary | ICD-10-CM | POA: Diagnosis not present

## 2019-05-23 DIAGNOSIS — M1711 Unilateral primary osteoarthritis, right knee: Secondary | ICD-10-CM | POA: Diagnosis not present

## 2019-05-23 DIAGNOSIS — M25561 Pain in right knee: Secondary | ICD-10-CM | POA: Diagnosis not present

## 2019-05-23 NOTE — Telephone Encounter (Signed)
*  STAT* If patient is at the pharmacy, call can be transferred to refill team.   1. Which medications need to be refilled? (please list name of each medication and dose if known) amLODipine (NORVASC) 5 MG tablet, bisoprolol (ZEBETA) 10 MG tablet, famotidine (PEPCID) 20 MG tablet, losartan-hydrochlorothiazide (HYZAAR) 50-12.5 MG tablet, pantoprazole (PROTONIX) 40 MG tablet  2. Which pharmacy/location (including street and city if local pharmacy) is medication to be sent to? [facility] [place], [place]  3. Do they need a 30 day or 90 day supply? [place]

## 2019-05-24 ENCOUNTER — Other Ambulatory Visit: Payer: Self-pay

## 2019-05-24 ENCOUNTER — Encounter (HOSPITAL_COMMUNITY): Payer: Self-pay | Admitting: Emergency Medicine

## 2019-05-24 ENCOUNTER — Emergency Department (HOSPITAL_COMMUNITY): Payer: Medicare Other

## 2019-05-24 ENCOUNTER — Inpatient Hospital Stay (HOSPITAL_COMMUNITY)
Admission: EM | Admit: 2019-05-24 | Discharge: 2019-05-26 | DRG: 281 | Disposition: A | Discharge status: Home / Self Care | Payer: Medicare Other | Attending: Internal Medicine | Admitting: Internal Medicine

## 2019-05-24 DIAGNOSIS — I10 Essential (primary) hypertension: Secondary | ICD-10-CM | POA: Diagnosis present

## 2019-05-24 DIAGNOSIS — I361 Nonrheumatic tricuspid (valve) insufficiency: Secondary | ICD-10-CM | POA: Diagnosis not present

## 2019-05-24 DIAGNOSIS — F1410 Cocaine abuse, uncomplicated: Secondary | ICD-10-CM | POA: Diagnosis present

## 2019-05-24 DIAGNOSIS — G4733 Obstructive sleep apnea (adult) (pediatric): Secondary | ICD-10-CM | POA: Diagnosis not present

## 2019-05-24 DIAGNOSIS — K219 Gastro-esophageal reflux disease without esophagitis: Secondary | ICD-10-CM | POA: Diagnosis not present

## 2019-05-24 DIAGNOSIS — E119 Type 2 diabetes mellitus without complications: Secondary | ICD-10-CM | POA: Diagnosis not present

## 2019-05-24 DIAGNOSIS — F17210 Nicotine dependence, cigarettes, uncomplicated: Secondary | ICD-10-CM | POA: Diagnosis present

## 2019-05-24 DIAGNOSIS — M170 Bilateral primary osteoarthritis of knee: Secondary | ICD-10-CM | POA: Diagnosis present

## 2019-05-24 DIAGNOSIS — I480 Paroxysmal atrial fibrillation: Principal | ICD-10-CM | POA: Diagnosis present

## 2019-05-24 DIAGNOSIS — Z7984 Long term (current) use of oral hypoglycemic drugs: Secondary | ICD-10-CM | POA: Diagnosis not present

## 2019-05-24 DIAGNOSIS — Z6838 Body mass index (BMI) 38.0-38.9, adult: Secondary | ICD-10-CM | POA: Diagnosis not present

## 2019-05-24 DIAGNOSIS — I4891 Unspecified atrial fibrillation: Secondary | ICD-10-CM | POA: Diagnosis not present

## 2019-05-24 DIAGNOSIS — R0602 Shortness of breath: Secondary | ICD-10-CM | POA: Diagnosis not present

## 2019-05-24 DIAGNOSIS — Z888 Allergy status to other drugs, medicaments and biological substances status: Secondary | ICD-10-CM

## 2019-05-24 DIAGNOSIS — Z20822 Contact with and (suspected) exposure to covid-19: Secondary | ICD-10-CM | POA: Diagnosis present

## 2019-05-24 DIAGNOSIS — I252 Old myocardial infarction: Secondary | ICD-10-CM

## 2019-05-24 DIAGNOSIS — I21A1 Myocardial infarction type 2: Secondary | ICD-10-CM | POA: Diagnosis not present

## 2019-05-24 DIAGNOSIS — Z8701 Personal history of pneumonia (recurrent): Secondary | ICD-10-CM

## 2019-05-24 DIAGNOSIS — Z7982 Long term (current) use of aspirin: Secondary | ICD-10-CM

## 2019-05-24 DIAGNOSIS — R079 Chest pain, unspecified: Secondary | ICD-10-CM | POA: Diagnosis not present

## 2019-05-24 DIAGNOSIS — Z7951 Long term (current) use of inhaled steroids: Secondary | ICD-10-CM

## 2019-05-24 DIAGNOSIS — Z79899 Other long term (current) drug therapy: Secondary | ICD-10-CM

## 2019-05-24 DIAGNOSIS — Z9114 Patient's other noncompliance with medication regimen: Secondary | ICD-10-CM | POA: Diagnosis not present

## 2019-05-24 DIAGNOSIS — E785 Hyperlipidemia, unspecified: Secondary | ICD-10-CM | POA: Diagnosis not present

## 2019-05-24 DIAGNOSIS — J449 Chronic obstructive pulmonary disease, unspecified: Secondary | ICD-10-CM | POA: Diagnosis present

## 2019-05-24 DIAGNOSIS — I5032 Chronic diastolic (congestive) heart failure: Secondary | ICD-10-CM | POA: Diagnosis present

## 2019-05-24 DIAGNOSIS — I2510 Atherosclerotic heart disease of native coronary artery without angina pectoris: Secondary | ICD-10-CM | POA: Diagnosis present

## 2019-05-24 DIAGNOSIS — Z8249 Family history of ischemic heart disease and other diseases of the circulatory system: Secondary | ICD-10-CM

## 2019-05-24 DIAGNOSIS — I110 Hypertensive heart disease with heart failure: Secondary | ICD-10-CM | POA: Diagnosis present

## 2019-05-24 DIAGNOSIS — Z833 Family history of diabetes mellitus: Secondary | ICD-10-CM

## 2019-05-24 DIAGNOSIS — Z91018 Allergy to other foods: Secondary | ICD-10-CM

## 2019-05-24 DIAGNOSIS — I340 Nonrheumatic mitral (valve) insufficiency: Secondary | ICD-10-CM | POA: Diagnosis not present

## 2019-05-24 LAB — BRAIN NATRIURETIC PEPTIDE: B Natriuretic Peptide: 182.5 pg/mL — ABNORMAL HIGH (ref 0.0–100.0)

## 2019-05-24 LAB — BASIC METABOLIC PANEL
Anion gap: 12 (ref 5–15)
BUN: 16 mg/dL (ref 6–20)
CO2: 19 mmol/L — ABNORMAL LOW (ref 22–32)
Calcium: 9.4 mg/dL (ref 8.9–10.3)
Chloride: 105 mmol/L (ref 98–111)
Creatinine, Ser: 1.03 mg/dL — ABNORMAL HIGH (ref 0.44–1.00)
GFR calc Af Amer: >60 mL/min (ref >60)
GFR calc non Af Amer: >60 mL/min (ref >60)
Glucose, Bld: 220 mg/dL — ABNORMAL HIGH (ref 70–99)
Potassium: 3.6 mmol/L (ref 3.5–5.1)
Sodium: 136 mmol/L (ref 135–145)

## 2019-05-24 LAB — RAPID URINE DRUG SCREEN, HOSP PERFORMED
Amphetamines: NOT DETECTED
Barbiturates: NOT DETECTED
Benzodiazepines: NOT DETECTED
Cocaine: POSITIVE — AB
Opiates: NOT DETECTED
Tetrahydrocannabinol: NOT DETECTED

## 2019-05-24 LAB — CBG MONITORING, ED: Glucose-Capillary: 252 mg/dL — ABNORMAL HIGH (ref 70–99)

## 2019-05-24 LAB — CBC
HCT: 45 % (ref 36.0–46.0)
Hemoglobin: 14.7 g/dL (ref 12.0–15.0)
MCH: 28.4 pg (ref 26.0–34.0)
MCHC: 32.7 g/dL (ref 30.0–36.0)
MCV: 87 fL (ref 80.0–100.0)
Platelets: 264 10*3/uL (ref 150–400)
RBC: 5.17 MIL/uL — ABNORMAL HIGH (ref 3.87–5.11)
RDW: 13.7 % (ref 11.5–15.5)
WBC: 10.3 10*3/uL (ref 4.0–10.5)
nRBC: 0 % (ref 0.0–0.2)

## 2019-05-24 LAB — RESPIRATORY PANEL BY RT PCR (FLU A&B, COVID)
Influenza A by PCR: NEGATIVE
Influenza B by PCR: NEGATIVE
SARS Coronavirus 2 by RT PCR: NEGATIVE

## 2019-05-24 LAB — LIPID PANEL
Cholesterol: 172 mg/dL (ref 0–200)
HDL: 41 mg/dL (ref >40)
LDL Cholesterol: 109 mg/dL — ABNORMAL HIGH (ref 0–99)
Total CHOL/HDL Ratio: 4.2 RATIO
Triglycerides: 108 mg/dL (ref <150)
VLDL: 22 mg/dL (ref 0–40)

## 2019-05-24 LAB — MAGNESIUM: Magnesium: 2 mg/dL (ref 1.7–2.4)

## 2019-05-24 LAB — TSH: TSH: 1.966 u[IU]/mL (ref 0.350–4.500)

## 2019-05-24 LAB — TROPONIN I (HIGH SENSITIVITY): Troponin I (High Sensitivity): 235 ng/L (ref <18)

## 2019-05-24 MED ORDER — ACETAMINOPHEN 325 MG PO TABS: 650.0000 mg | ORAL_TABLET | Freq: Four times a day (QID) | ORAL | Status: DC | PRN

## 2019-05-24 MED ORDER — BISOPROLOL FUMARATE 10 MG PO TABS: 10.0000 mg | ORAL_TABLET | Freq: Two times a day (BID) | ORAL | 1 refills | Status: DC

## 2019-05-24 MED ORDER — ASPIRIN EC 81 MG PO TBEC
81.0000 mg | DELAYED_RELEASE_TABLET | Freq: Every day | ORAL | Status: DC
  Administered 2019-05-25 – 2019-05-26 (×2): 81 mg via ORAL
  Filled 2019-05-24 (×2): qty 1

## 2019-05-24 MED ORDER — APIXABAN 5 MG PO TABS
5.0000 mg | ORAL_TABLET | Freq: Two times a day (BID) | ORAL | Status: DC
  Administered 2019-05-24 – 2019-05-26 (×5): 5 mg via ORAL
  Filled 2019-05-24 (×6): qty 1

## 2019-05-24 MED ORDER — INSULIN ASPART 100 UNIT/ML ~~LOC~~ SOLN
0.0000 [IU] | Freq: Three times a day (TID) | SUBCUTANEOUS | Status: DC
  Administered 2019-05-25: 3 [IU] via SUBCUTANEOUS
  Filled 2019-05-24: qty 0.09

## 2019-05-24 MED ORDER — OXYCODONE HCL 5 MG PO TABS: 5.0000 mg | ORAL_TABLET | ORAL | Status: DC | PRN

## 2019-05-24 MED ORDER — DICLOFENAC SODIUM 1 % TD GEL
4.0000 g | Freq: Four times a day (QID) | TRANSDERMAL | Status: DC | PRN
  Filled 2019-05-24: qty 100

## 2019-05-24 MED ORDER — AMLODIPINE BESYLATE 5 MG PO TABS: ORAL_TABLET | ORAL | 1 refills | Status: DC

## 2019-05-24 MED ORDER — ONDANSETRON HCL 4 MG PO TABS: 4.0000 mg | ORAL_TABLET | Freq: Four times a day (QID) | ORAL | Status: DC | PRN

## 2019-05-24 MED ORDER — INSULIN ASPART 100 UNIT/ML ~~LOC~~ SOLN
0.0000 [IU] | Freq: Every day | SUBCUTANEOUS | Status: DC
  Administered 2019-05-24: 3 [IU] via SUBCUTANEOUS
  Filled 2019-05-24: qty 0.05

## 2019-05-24 MED ORDER — AMLODIPINE BESYLATE 5 MG PO TABS
5.0000 mg | ORAL_TABLET | Freq: Every day | ORAL | Status: DC
  Administered 2019-05-24 – 2019-05-26 (×3): 5 mg via ORAL
  Filled 2019-05-24 (×3): qty 1

## 2019-05-24 MED ORDER — METOPROLOL TARTRATE 50 MG PO TABS
50.0000 mg | ORAL_TABLET | Freq: Two times a day (BID) | ORAL | Status: DC
  Administered 2019-05-24 – 2019-05-26 (×4): 50 mg via ORAL
  Filled 2019-05-24: qty 1
  Filled 2019-05-24: qty 2
  Filled 2019-05-24 (×2): qty 1

## 2019-05-24 MED ORDER — LOSARTAN POTASSIUM-HCTZ 50-12.5 MG PO TABS: 1.0000 | ORAL_TABLET | Freq: Every day | ORAL | 2 refills | Status: DC

## 2019-05-24 MED ORDER — ONDANSETRON HCL 4 MG/2ML IJ SOLN: 4.0000 mg | Freq: Four times a day (QID) | INTRAMUSCULAR | Status: DC | PRN

## 2019-05-24 MED ORDER — METOPROLOL TARTRATE 25 MG PO TABS
50.0000 mg | ORAL_TABLET | Freq: Once | ORAL | Status: AC
  Administered 2019-05-24: 50 mg via ORAL
  Filled 2019-05-24: qty 2

## 2019-05-24 MED ORDER — FAMOTIDINE 20 MG PO TABS: ORAL_TABLET | ORAL | 1 refills | Status: DC

## 2019-05-24 MED ORDER — FLUTICASONE FUROATE-VILANTEROL 100-25 MCG/INH IN AEPB
1.0000 | INHALATION_SPRAY | Freq: Every day | RESPIRATORY_TRACT | Status: DC
  Administered 2019-05-25 – 2019-05-26 (×2): 1 via RESPIRATORY_TRACT
  Filled 2019-05-24: qty 28

## 2019-05-24 MED ORDER — SODIUM CHLORIDE 0.9 % IV BOLUS
500.0000 mL | Freq: Once | INTRAVENOUS | Status: AC
  Administered 2019-05-24: 500 mL via INTRAVENOUS

## 2019-05-24 MED ORDER — PANTOPRAZOLE SODIUM 40 MG PO TBEC
40.0000 mg | DELAYED_RELEASE_TABLET | Freq: Every day | ORAL | Status: DC
  Administered 2019-05-24 – 2019-05-26 (×3): 40 mg via ORAL
  Filled 2019-05-24 (×3): qty 1

## 2019-05-24 MED ORDER — METOPROLOL TARTRATE 5 MG/5ML IV SOLN
INTRAVENOUS | Status: AC
  Administered 2019-05-24: 5 mg
  Filled 2019-05-24: qty 10

## 2019-05-24 MED ORDER — METOPROLOL TARTRATE 5 MG/5ML IV SOLN
5.0000 mg | INTRAVENOUS | Status: AC | PRN
  Administered 2019-05-24 (×3): 5 mg via INTRAVENOUS
  Filled 2019-05-24: qty 5

## 2019-05-24 MED ORDER — PANTOPRAZOLE SODIUM 40 MG PO TBEC: 40.0000 mg | DELAYED_RELEASE_TABLET | Freq: Every day | ORAL | 2 refills | Status: DC

## 2019-05-24 MED ORDER — ACETAMINOPHEN 650 MG RE SUPP: 650.0000 mg | Freq: Four times a day (QID) | RECTAL | Status: DC | PRN

## 2019-05-24 MED ORDER — SENNOSIDES-DOCUSATE SODIUM 8.6-50 MG PO TABS: 1.0000 | ORAL_TABLET | Freq: Every evening | ORAL | Status: DC | PRN

## 2019-05-24 NOTE — ED Provider Notes (Signed)
[facility] DEPT Provider Note   CSN: [id number] Arrival date & time: 05/24/19  1301     History Chief Complaint  Patient presents with  . Tachycardia    Rebecca Malone is a 54 y.o. female.  HPI     54 year old female with history of hypertension, CAD, diabetes, OSA comes in a chief complaint of chest discomfort.  Patient is also complaining of palpitations.  Patient symptoms started suddenly around 9 AM.  She reports that she started noticing racing heart along with chest discomfort.  Chest discomfort described as tightness, not similar to her previous MI.  Symptoms have been constant, she has felt like she might faint therefore she decided to come to the ER.  Patient has been taking her medications as prescribed.  She denies having similar symptoms recently, but does indicate that she has had off-and-on palpitations like feeling in the past.  Palpitations are typically unprovoked, not severe and they resolve on their own.  Past Medical History:  Diagnosis Date  . Asthma   . Chest pain march 2014   admitted to hospital   . Degenerative arthritis    "knees" (03/07/2015)  . GERD (gastroesophageal reflux disease)   . History of blood transfusion 10/2009   "related to losing too much blood w/my period"  . Hypertension   . MI (myocardial infarction) [facility]) March 2014  . Obesity   . OSA on CPAP   . Pneumonia 2008  . Tachycardia march 2014   admitted to hospital  . Type II diabetes mellitus [facility])     Patient Active Problem List   Diagnosis Date Noted  . Atrial fibrillation with rapid ventricular response ([place]) 05/24/2019  . Diastolic CHF, acute ([place]) 09/02/2017  . HCVD (hypertensive cardiovascular disease) 09/02/2017  . Influenza A 03/07/2015  . Essential hypertension 06/09/2013  . Palpitations 06/07/2012  . Cigarette smoker 06/07/2012  . Acute asthma exacerbation 04/08/2012  . CAP (community acquired pneumonia) 04/08/2012  . SOB  (shortness of breath) 04/08/2012  . Non-insulin treated type 2 diabetes mellitus ([place]) 09/11/2011  . Degenerative arthritis of knee 09/11/2011  . Neuropathy due to secondary diabetes ([place]) 09/11/2011  . Chest pain on exertion 09/09/2011  . Edema extremities 09/09/2011  . Anemia 09/09/2011  . OBESITY, MORBID 06/28/2008  . Cough variant asthma vs uacs 06/28/2008  . BACK PAIN, LUMBAR 06/28/2008    Past Surgical History:  Procedure Laterality Date  . CESAREAN SECTION  1999     OB History   No obstetric history on file.     Family History  Problem Relation Age of Onset  . Heart attack Father   . Diabetes Sister   . Diabetes Brother     Social History   Tobacco Use  . Smoking status: Current Every Day Smoker    Packs/day: 0.35    Years: 20.00    Pack years: 7.00    Types: Cigarettes  . Smokeless tobacco: Never Used  Substance Use Topics  . Alcohol use: Yes  . Drug use: No    Home Medications Prior to Admission medications   Medication Sig Start Date End Date Taking? Authorizing Provider  albuterol (PROVENTIL) (2.5 MG/3ML) 0.083% nebulizer solution Take 3 mLs (2.5 mg total) by nebulization every 6 (six) hours as needed for wheezing or shortness of breath. 1 time refill for patient send to PCP 09/12/18  Yes Almyra Deforest, PA  amLODipine (NORVASC) 5 MG tablet TAKE 1 TABLET(5 MG) BY MOUTH DAILY Patient taking differently: Take  5 mg by mouth daily.  05/24/19  Yes Almyra Deforest, PA  aspirin EC 81 MG EC tablet Take 1 tablet (81 mg total) by mouth daily. 04/09/12  Yes Laza, Sorin C, MD  bisoprolol (ZEBETA) 10 MG tablet Take 1 tablet (10 mg total) by mouth 2 (two) times daily. 05/24/19  Yes Almyra Deforest, PA  budesonide-formoterol (SYMBICORT) 80-4.5 MCG/ACT inhaler Take 2 puffs first thing in am and then another 2 puffs about 12 hours later. 09/12/18  Yes Almyra Deforest, PA  diclofenac sodium (VOLTAREN) 1 % GEL Apply 4 g topically 4 (four) times daily as needed (knee pain).    Yes [provider]  famotidine (PEPCID) 20 MG tablet TAKE 1 TABLET(20 MG) BY MOUTH AT BEDTIME Patient taking differently: Take 20 mg by mouth at bedtime.  05/24/19  Yes Almyra Deforest, PA  HYDROcodone-acetaminophen (NORCO/VICODIN) 5-325 MG tablet Take 1 tablet by mouth every 6 (six) hours as needed for pain. 05/23/19  Yes [provider]  losartan-hydrochlorothiazide (HYZAAR) 50-12.5 MG tablet Take 1 tablet by mouth daily. 05/24/19  Yes Almyra Deforest, PA  metFORMIN (GLUCOPHAGE) 1000 MG tablet Take 1 tablet (1,000 mg total) by mouth 2 (two) times daily with a meal. 09/12/18 05/24/19 Yes Meng, Isaac Laud, PA  naproxen sodium (ALEVE) 220 MG tablet Take 440 mg by mouth daily as needed (pain).   Yes [provider]  pantoprazole (PROTONIX) 40 MG tablet Take 1 tablet (40 mg total) by mouth daily. Take 30-60 min before first meal of the day 05/24/19  Yes Meng, [place], PA  lidocaine (LIDODERM) 5 % Place 1 patch onto the skin daily. Remove & Discard patch within 12 hours or as directed by MD Patient not taking: Reported on 05/24/2019 04/13/18   Nuala Alpha A, PA-C  predniSONE (DELTASONE) 20 MG tablet Take 2 tablets (40 mg total) by mouth daily. Patient not taking: Reported on 05/24/2019 02/05/18   Ward, Ozella Almond, PA-C    Allergies    Advair diskus [fluticasone-salmeterol], Other, and Pork-derived products  Review of Systems   Review of Systems  Constitutional: Positive for activity change.  Respiratory: Positive for shortness of breath.   Cardiovascular: Positive for chest pain and palpitations.  Gastrointestinal: Negative for nausea and vomiting.  Neurological: Positive for dizziness.  All other systems reviewed and are negative.   Physical Exam Updated Vital Signs BP 111/86   Pulse (!) 115   Temp 98.5 F (36.9 C) (Oral)   Resp 14   Ht 5\' 6"  (1.676 m)   Wt 107.5 kg   LMP 09/30/2012   SpO2 95%   BMI 38.25 kg/m   Physical Exam Vitals and nursing note reviewed.  Constitutional:       Appearance: She is well-developed.  HENT:     Head: Normocephalic and atraumatic.  Eyes:     Pupils: Pupils are equal, round, and reactive to light.  Cardiovascular:     Rate and Rhythm: Tachycardia present. Rhythm irregular.  Pulmonary:     Effort: Pulmonary effort is normal. No respiratory distress.  Abdominal:     General: There is no distension.     Palpations: Abdomen is soft.     Tenderness: There is no abdominal tenderness. There is no guarding or rebound.  Musculoskeletal:        General: No swelling or tenderness.     Cervical back: Neck supple.  Skin:    General: Skin is warm and dry.  Neurological:     Mental Status: She is alert  and oriented to person, place, and time.     ED Results / Procedures / Treatments   Labs (all labs ordered are listed, but only abnormal results are displayed) Labs Reviewed  BASIC METABOLIC PANEL - Abnormal; Notable for the following components:      Result Value   CO2 19 (*)    Glucose, Bld 220 (*)    Creatinine, Ser 1.03 (*)    All other components within normal limits  CBC - Abnormal; Notable for the following components:   RBC 5.17 (*)    All other components within normal limits  RESPIRATORY PANEL BY RT PCR (FLU A&B, COVID)  MAGNESIUM  I-STAT BETA HCG BLOOD, ED ([facility], [facility], [facility] ONLY)    EKG EKG Interpretation  Date/Time:  Wednesday May 24 2019 13:07:32 EDT Ventricular Rate:  160 PR Interval:    QRS Duration: 88 QT Interval:  311 QTC Calculation: 508 R Axis:   74 Text Interpretation: Atrial fibrillation Consider left ventricular hypertrophy Repolarization abnormality, prob rate related Minimal ST elevation, lateral leads No acute changes No significant change since last tracing Nonspecific ST and T wave abnormality Confirmed by Varney Biles [phone]) on 05/24/2019 2:06:18 PM    EKG Interpretation  Date/Time:  Wednesday May 24 2019 14:52:27 EDT Ventricular Rate:  116 PR Interval:    QRS Duration: 98 QT  Interval:  353 QTC Calculation: 491 R Axis:   73 Text Interpretation: Atrial fibrillation Abnormal T, consider ischemia, lateral leads no signs of acute ischemia patient has new TWI in the inferior and lateral leads Confirmed by Varney Biles [id number]) on 05/24/2019 3:29:43 PM        Radiology DG Chest Port 1 View  Result Date: 05/24/2019 CLINICAL DATA:  Acute shortness of breath and tachycardia. EXAM: PORTABLE CHEST 1 VIEW COMPARISON:  02/05/2018 and prior radiographs FINDINGS: The cardiomediastinal silhouette is unremarkable. Pulmonary vascular congestion is noted. There is no evidence of focal airspace disease, pulmonary edema, suspicious pulmonary nodule/mass, pleural effusion, or pneumothorax. No acute bony abnormalities are identified. IMPRESSION: Pulmonary vascular congestion. Electronically Signed   By: Margarette Canada M.D.   On: 05/24/2019 13:50    Procedures .Critical Care Performed by: Varney Biles, MD Authorized by: Varney Biles, MD   Critical care provider statement:    Critical care time (minutes):  62   Critical care was time spent personally by me on the following activities:  Discussions with consultants, evaluation of patient's response to treatment, examination of patient, ordering and performing treatments and interventions, ordering and review of laboratory studies, ordering and review of radiographic studies, pulse oximetry, re-evaluation of patient's condition, obtaining history from patient or surrogate and review of old charts   (including critical care time)  Medications Ordered in ED Medications  apixaban (ELIQUIS) tablet 5 mg (5 mg Oral Given 05/24/19 1448)  sodium chloride 0.9 % bolus 500 mL (0 mLs Intravenous Stopped 05/24/19 1418)  metoprolol tartrate (LOPRESSOR) injection 5 mg (5 mg Intravenous Given 05/24/19 1415)  metoprolol tartrate (LOPRESSOR) 5 MG/5ML injection (5 mg  Given 05/24/19 1429)  metoprolol tartrate (LOPRESSOR) tablet 50 mg (50 mg Oral Given  05/24/19 1448)    ED Course  I have reviewed the triage vital signs and the nursing notes.  Pertinent labs & imaging results that were available during my care of the patient were reviewed by me and considered in my medical decision making (see chart for details).  Clinical Course as of May 24 1530  Wed May 24, 2019  1524 Patient's  heart rate has improved to 110-120 with IV metoprolol and oral Lopressor has been ordered.  Patient is stable for admission at this time.  Pulse Rate(!): 111 [AN]  1531 Patient continues to feel better.  Stable for admission.  Repeat EKG is not showing any acute ischemia, there is new T wave inversions.  We did not order troponin as we do not think underlying process is ACS, and the troponin will likely be elevated because of demand.   [AN]    Clinical Course User Index [AN] Varney Biles, MD   MDM Rules/Calculators/A&P                      This patients CHA2DS2-VASc Score and unadjusted Ischemic Stroke Rate (% per year) is equal to 3.2 % stroke rate/year from a score of 3  Above score calculated as 1 point each if present [CHF, HTN, DM, Vascular=MI/PAD/Aortic Plaque, Age if 65-74, or Female] Above score calculated as 2 points each if present [Age > 75, or Stroke/TIA/TE]   54 year old female comes in a chief complaint of chest pain and palpitations. She is noted to be in A. fib with RVR.  EKG showing some nonspecific ST changes and T wave inversions, however it seems like the chest pain is resultant of demand ischemia from her RVR and not primarily because of MI.  Patient is symptomatic from her A. fib with RVR.  Medical records indicate that patient does not have CHF history.  Currently she does not appear to be in florid CHF, therefore we will start her on IV metoprolol.  Likely the underlying process is her lung disease and also obstructive sleep apnea.  Patient does not have any PE risk factors and clinically we do not think she has underlying blood  clot as the problem.  We will initiate basic labs.  Cardiac pads have been placed in case patient decompensates.   Final Clinical Impression(s) / ED Diagnoses Final diagnoses:  Atrial fibrillation with RVR [facility])    Rx / DC Orders ED Discharge Orders         Ordered    Amb referral to AFIB Clinic     05/24/19 [place], Gertrude Bucks, MD 05/24/19 1532

## 2019-05-24 NOTE — ED Triage Notes (Signed)
Pt states her HR beating really fast and having SOB, unsure time frame of when started. Reports MI in 2014.

## 2019-05-24 NOTE — H&P (Signed)
History and Physical    Rebecca Malone [id number] DOB: [birth year] DOA: 05/24/2019  PCP: System, Pcp Not In  Patient coming from: Home  I have personally briefly reviewed patient's old medical records in [place]  Chief Complaint: Palpitations, chest pain  HPI: Rebecca Malone is a 54 y.o. female with medical history significant of type 2 diabetes mellitus, history of MI 2014, essential hypertension, GERD, COPD presented to the ED with complaint of shortness of breath, palpitations, dizziness and chest pain.  Patient reports onset of symptoms early this morning roughly 9 AM when awakening from bed.  She reported severe palpitations associated with chest pain.  Chest pain is described as sharp/stabbing, localized to the substernal region with a feeling of a "back-and-forth sensation".  She denies any other radiation.  Patient also reports dizziness and presyncopal episode.  Patient with history of MI in 2014, no previous heart catheterization and managed medically.  Follows with cardiology, Dr. Keene Breath outpatient.  Patient does admit to being noncompliant with her home medications, reports has not taken many of her medicines over the past 3 days.  She denies any history of A. fib in the past.  ED Course: Temperature 98.5, HR 169, RR 14, BP 111/86, SPO2 95% on room air.  The BC count 10.3, hemoglobin 14.7, platelets 264.  Sodium 136, potassium 3.6, chloride 105, CO2 19, BUN 16, creatinine 1.03, glucose 220.  Magnesium 2.0.  Chest x-ray with pulmonary vascular congestion.  Patient was given Lopressor 5 mg IV x1, metoprolol tartrate 50 mg p.o. x1 and a 500 mL normal saline bolus.  EDP referred patient for admission for further management and evaluation of new onset A. fib with RVR.  Review of Systems: As per HPI otherwise 10 point review of systems negative.    Past Medical History:  Diagnosis Date  . Asthma   . Chest pain march 2014   admitted to hospital   . Degenerative  arthritis    "knees" (03/07/2015)  . GERD (gastroesophageal reflux disease)   . History of blood transfusion 10/2009   "related to losing too much blood w/my period"  . Hypertension   . MI (myocardial infarction) [facility]) March 2014  . Obesity   . OSA on CPAP   . Pneumonia 2008  . Tachycardia march 2014   admitted to hospital  . Type II diabetes mellitus [facility])     Past Surgical History:  Procedure Laterality Date  . [place]     reports that she has been smoking cigarettes. She has a 7.00 pack-year smoking history. She has never used smokeless tobacco. She reports current alcohol use. She reports that she does not use drugs.  Allergies  Allergen Reactions  . Advair Diskus [Fluticasone-Salmeterol] Other (See Comments)    Made sores on the back of her throat  . Other Hives and Itching    Patient states she is allergic to all generic medications. Can only have [place]  . Pork-Derived Products     Religious     Family History  Problem Relation Age of Onset  . Heart attack Father   . Diabetes Sister   . Diabetes Brother     Family history reviewed and not pertinent   Prior to Admission medications   Medication Sig Start Date End Date Taking? Authorizing Provider  albuterol (PROVENTIL) (2.5 MG/3ML) 0.083% nebulizer solution Take 3 mLs (2.5 mg total) by nebulization every 6 (six) hours as needed for wheezing or shortness  of breath. 1 time refill for patient send to PCP 09/12/18  Yes Almyra Deforest, PA  amLODipine (NORVASC) 5 MG tablet TAKE 1 TABLET(5 MG) BY MOUTH DAILY Patient taking differently: Take 5 mg by mouth daily.  05/24/19  Yes Almyra Deforest, PA  aspirin EC 81 MG EC tablet Take 1 tablet (81 mg total) by mouth daily. 04/09/12  Yes Laza, Sorin C, MD  bisoprolol (ZEBETA) 10 MG tablet Take 1 tablet (10 mg total) by mouth 2 (two) times daily. 05/24/19  Yes Almyra Deforest, PA  budesonide-formoterol (SYMBICORT) 80-4.5 MCG/ACT inhaler Take 2 puffs first thing in am and  then another 2 puffs about 12 hours later. 09/12/18  Yes Almyra Deforest, PA  diclofenac sodium (VOLTAREN) 1 % GEL Apply 4 g topically 4 (four) times daily as needed (knee pain).    Yes [provider]  famotidine (PEPCID) 20 MG tablet TAKE 1 TABLET(20 MG) BY MOUTH AT BEDTIME Patient taking differently: Take 20 mg by mouth at bedtime.  05/24/19  Yes Almyra Deforest, PA  HYDROcodone-acetaminophen (NORCO/VICODIN) 5-325 MG tablet Take 1 tablet by mouth every 6 (six) hours as needed for pain. 05/23/19  Yes [provider]  losartan-hydrochlorothiazide (HYZAAR) 50-12.5 MG tablet Take 1 tablet by mouth daily. 05/24/19  Yes Almyra Deforest, PA  metFORMIN (GLUCOPHAGE) 1000 MG tablet Take 1 tablet (1,000 mg total) by mouth 2 (two) times daily with a meal. 09/12/18 05/24/19 Yes Meng, Isaac Laud, PA  naproxen sodium (ALEVE) 220 MG tablet Take 440 mg by mouth daily as needed (pain).   Yes [provider]  pantoprazole (PROTONIX) 40 MG tablet Take 1 tablet (40 mg total) by mouth daily. Take 30-60 min before first meal of the day 05/24/19  Yes Almyra Deforest, [place]    Physical Exam: Vitals:   05/24/19 1438 05/24/19 1445 05/24/19 1500 05/24/19 1530  BP:  109/67 111/86 (!) 148/82  Pulse: (!) 113 (!) 111 (!) 115 (!) 112  Resp: (!) 25 (!) 22 14 19   Temp:      TempSrc:      SpO2: 96% 90% 95% (!) 88%  Weight:      Height:        Constitutional: NAD, calm, comfortable Eyes: PERRL, lids and conjunctivae normal ENMT: Mucous membranes are moist. Posterior pharynx clear of any exudate or lesions.Normal dentition.  Neck: normal, supple, no masses, no thyromegaly Respiratory: clear to auscultation bilaterally, no wheezing, no crackles. Normal respiratory effort. No accessory muscle use.  Oxygenating well on room air Cardiovascular: Tachycardic, irregularly irregular rhythm, no murmurs / rubs / gallops. No extremity edema. 2+ pedal pulses. No carotid bruits.  Abdomen: no tenderness, no masses palpated. No hepatosplenomegaly.  Bowel sounds positive.  Musculoskeletal: no clubbing / cyanosis. No joint deformity upper and lower extremities. Good ROM, no contractures. Normal muscle tone.  Skin: no rashes, lesions, ulcers. No induration Neurologic: CN 2-12 grossly intact. Sensation intact, DTR normal. Strength 5/5 in all 4.  Psychiatric: Normal judgment and insight. Alert and oriented x 3. Normal mood.    Labs on Admission: I have personally reviewed following labs and imaging studies  CBC: Recent Labs  Lab 05/24/19 1317  WBC 10.3  HGB 14.7  HCT 45.0  MCV 87.0  PLT [id number]   Basic Metabolic Panel: Recent Labs  Lab 05/24/19 1317  NA 136  K 3.6  CL 105  CO2 19*  GLUCOSE 220*  BUN 16  CREATININE 1.03*  CALCIUM 9.4  MG 2.0   GFR: Estimated Creatinine Clearance: 77.5  mL/min (A) (by C-G formula based on SCr of 1.03 mg/dL (H)). Liver Function Tests: No results for input(s): AST, ALT, ALKPHOS, BILITOT, PROT, ALBUMIN in the last 168 hours. No results for input(s): LIPASE, AMYLASE in the last 168 hours. No results for input(s): AMMONIA in the last 168 hours. Coagulation Profile: No results for input(s): INR, PROTIME in the last 168 hours. Cardiac Enzymes: No results for input(s): CKTOTAL, CKMB, CKMBINDEX, TROPONINI in the last 168 hours. BNP (last 3 results) No results for input(s): PROBNP in the last 8760 hours. HbA1C: No results for input(s): HGBA1C in the last 72 hours. CBG: No results for input(s): GLUCAP in the last 168 hours. Lipid Profile: No results for input(s): CHOL, HDL, LDLCALC, TRIG, CHOLHDL, LDLDIRECT in the last 72 hours. Thyroid Function Tests: No results for input(s): TSH, T4TOTAL, FREET4, T3FREE, THYROIDAB in the last 72 hours. Anemia Panel: No results for input(s): VITAMINB12, FOLATE, FERRITIN, TIBC, IRON, RETICCTPCT in the last 72 hours. Urine analysis:    Component Value Date/Time   COLORURINE YELLOW 04/11/2018 2038   APPEARANCEUR HAZY (A) 04/11/2018 2038   LABSPEC 1.031 (H)  04/11/2018 2038   PHURINE 5.0 04/11/2018 2038   GLUCOSEU >=500 (A) 04/11/2018 2038   HGBUR NEGATIVE 04/11/2018 2038   BILIRUBINUR NEGATIVE 04/11/2018 2038   KETONESUR 5 (A) 04/11/2018 2038   PROTEINUR NEGATIVE 04/11/2018 2038   UROBILINOGEN 1.0 11/13/2009 2057   NITRITE NEGATIVE 04/11/2018 2038   LEUKOCYTESUR TRACE (A) 04/11/2018 2038    Radiological Exams on Admission: DG Chest Port 1 View  Result Date: 05/24/2019 CLINICAL DATA:  Acute shortness of breath and tachycardia. EXAM: PORTABLE CHEST 1 VIEW COMPARISON:  02/05/2018 and prior radiographs FINDINGS: The cardiomediastinal silhouette is unremarkable. Pulmonary vascular congestion is noted. There is no evidence of focal airspace disease, pulmonary edema, suspicious pulmonary nodule/mass, pleural effusion, or pneumothorax. No acute bony abnormalities are identified. IMPRESSION: Pulmonary vascular congestion. Electronically Signed   By: Margarette Canada M.D.   On: 05/24/2019 13:50    EKG: Independently reviewed.   Assessment/Plan Active Problems:   Atrial fibrillation with rapid ventricular response (HCC)   Atrial fibrillation with RVR, new onset Patient presenting with acute onset palpitations this morning which awoke her from sleep.  She reports has had episodes over the last 2 years but only lasting for 10 minutes.  No previous history of atrial fibrillation.  Additionally, patient reports noncompliance with home medications, especially over the past 3 days which is likely a contributing factor. CHA2DS2-VASc = 4 (female, htn, Hx MI, T2DM) --Admit to inpatient, telemetry --Check BNP, TSH, UDS, hemoglobin A1c, lipid panel --Trend troponin --Check echocardiogram --Start metoprolol tartrate 50 mg p.o. twice daily --Start Eliquis for anticoagulation --Cardiology consulted for assistance with management and further evaluation  Essential hypertension BP 111/86 on admission.  On amlodipine 5 mg, losartan/HCTZ 50-12.5 mg p.o. daily at  home. --Continue amlodipine 5 mg p.o. daily --We will hold combination losartan/HCTZ for now given borderline blood pressure --Continue monitor blood pressure closely  Type 2 diabetes mellitus On Metformin 1000 mg p.o. twice daily at home.  Glucose elevated to 20 on presentation.  Suspect poorly controlled. --Check hemoglobin A1c --Sensitive insulin sliding scale for coverage --CBGs before every meal/at bedtime  COPD On Symbicort 80-4.52 puffs twice daily at home.  Currently oxygenating well on room air, no wheezing. --Continue with hospital substitution Anoro Ellipta 1 puff daily  GERD: Continue PPI    DVT prophylaxis: Started Eliquis Code Status: Full code Family Communication: Updated patient's family present  at bedside Disposition Plan: Plan discharge home when heart rate stable and no further work-up per cardiology Consults called: Cardiology Admission status: Inpatient   Severity of Illness: The appropriate patient status for this patient is INPATIENT. Inpatient status is judged to be reasonable and necessary in order to provide the required intensity of service to ensure the patient's safety. The patient's presenting symptoms, physical exam findings, and initial radiographic and laboratory data in the context of their chronic comorbidities is felt to place them at high risk for further clinical deterioration. Furthermore, it is not anticipated that the patient will be medically stable for discharge from the hospital within 2 midnights of admission. The following factors support the patient status of inpatient.   " The patient's presenting symptoms include palpations, breath, dizziness, chest pain " The worrisome physical exam findings include heart rate 169 " The initial radiographic and laboratory data are worrisome because of pulmonary vascular congestion on chest x-ray " The chronic co-morbidities include essential hypertension, type 2 diabetes mellitus, COPD, GERD, medical  noncompliance.   * I certify that at the point of admission it is my clinical judgment that the patient will require inpatient hospital care spanning beyond 2 midnights from the point of admission due to high intensity of service, high risk for further deterioration and high frequency of surveillance required.*    Jamison Yuhasz J [place] DO [facility] Available via Epic secure chat 7am-7pm After these hours, please refer to coverage provider listed on [email or web address] 05/24/2019, 4:01 PM

## 2019-05-24 NOTE — Progress Notes (Signed)
ANTICOAGULATION CONSULT NOTE - Initial Consult  Pharmacy Consult for apixaban Indication: atrial fibrillation  Allergies  Allergen Reactions  . Advair Diskus [Fluticasone-Salmeterol] Other (See Comments)    Made sores on the back of her throat  . Other Hives and Itching    Patient states she is allergic to all generic medications. Can only have [place]  . Pork-Derived Products     Religious     Patient Measurements: Height: 5\' 6"  (167.6 cm) Weight: 107.5 kg (237 lb) IBW/kg (Calculated) : 59.3 Heparin Dosing Weight:   Vital Signs: Temp: 98.5 F (36.9 C) (04/28 1410) Temp Source: Oral (04/28 1410) BP: 148/82 (04/28 1530) Pulse Rate: 112 (04/28 1530)  Labs: Recent Labs    05/24/19 1317  HGB 14.7  HCT 45.0  PLT 264  CREATININE 1.03*    Estimated Creatinine Clearance: 77.5 mL/min (A) (by C-G formula based on SCr of 1.03 mg/dL (H)).   Medical History: Past Medical History:  Diagnosis Date  . Asthma   . Chest pain march 2014   admitted to hospital   . Degenerative arthritis    "knees" (03/07/2015)  . GERD (gastroesophageal reflux disease)   . History of blood transfusion 10/2009   "related to losing too much blood w/my period"  . Hypertension   . MI (myocardial infarction) [facility]) March 2014  . Obesity   . OSA on CPAP   . Pneumonia 2008  . Tachycardia march 2014   admitted to hospital  . Type II diabetes mellitus ([place])     Medications:  Scheduled:  . apixaban  5 mg Oral BID    Assessment: Pharmacy is consulted to dose apixaban in 54 yo female diagnosed with atrial fibrillation. noted to be in A. fib with RVR.  EKG showing some nonspecific ST changes and T wave inversions, however it seems like the chest pain is resultant of demand ischemia from her RVR and not primarily because of MI. No anticoagulation noted on PTA med list.  Today, 05/24/19  Scr 1.03, CrCl 78 ml/min  Hgb 14.7, plt 264     Goal of Therapy:  Monitor platelets by  anticoagulation protocol: Yes   Plan:   Apixaban 5 mg PO BID  Monitor CBC, SCr as available  Monitor for signs and symptoms of bleeding  Royetta Asal, PharmD, BCPS 05/24/2019 4:19 PM

## 2019-05-24 NOTE — Plan of Care (Signed)
  Problem: Education: Goal: Knowledge of disease or condition will improve Outcome: Progressing Goal: Understanding of medication regimen will improve Outcome: Progressing   Problem: Activity: Goal: Ability to tolerate increased activity will improve Outcome: Progressing   Problem: Cardiac: Goal: Ability to achieve and maintain adequate cardiopulmonary perfusion will improve Outcome: Progressing   

## 2019-05-25 ENCOUNTER — Inpatient Hospital Stay (HOSPITAL_COMMUNITY): Payer: Medicare Other

## 2019-05-25 ENCOUNTER — Encounter (HOSPITAL_COMMUNITY): Payer: Self-pay | Admitting: Internal Medicine

## 2019-05-25 DIAGNOSIS — I4891 Unspecified atrial fibrillation: Secondary | ICD-10-CM | POA: Diagnosis not present

## 2019-05-25 DIAGNOSIS — I361 Nonrheumatic tricuspid (valve) insufficiency: Secondary | ICD-10-CM | POA: Diagnosis not present

## 2019-05-25 DIAGNOSIS — I340 Nonrheumatic mitral (valve) insufficiency: Secondary | ICD-10-CM | POA: Diagnosis not present

## 2019-05-25 LAB — COMPREHENSIVE METABOLIC PANEL
ALT: 18 U/L (ref 0–44)
AST: 18 U/L (ref 15–41)
Albumin: 3.4 g/dL — ABNORMAL LOW (ref 3.5–5.0)
Alkaline Phosphatase: 77 U/L (ref 38–126)
Anion gap: 8 (ref 5–15)
BUN: 16 mg/dL (ref 6–20)
CO2: 23 mmol/L (ref 22–32)
Calcium: 8.9 mg/dL (ref 8.9–10.3)
Chloride: 107 mmol/L (ref 98–111)
Creatinine, Ser: 0.85 mg/dL (ref 0.44–1.00)
GFR calc Af Amer: >60 mL/min (ref >60)
GFR calc non Af Amer: >60 mL/min (ref >60)
Glucose, Bld: 182 mg/dL — ABNORMAL HIGH (ref 70–99)
Potassium: 4 mmol/L (ref 3.5–5.1)
Sodium: 138 mmol/L (ref 135–145)
Total Bilirubin: 0.4 mg/dL (ref 0.3–1.2)
Total Protein: 6.6 g/dL (ref 6.5–8.1)

## 2019-05-25 LAB — HEMOGLOBIN A1C
Hgb A1c MFr Bld: 8 % — ABNORMAL HIGH (ref 4.8–5.6)
Mean Plasma Glucose: 182.9 mg/dL

## 2019-05-25 LAB — T4, FREE: Free T4: 0.9 ng/dL (ref 0.61–1.12)

## 2019-05-25 LAB — CBC
HCT: 39.7 % (ref 36.0–46.0)
Hemoglobin: 12.2 g/dL (ref 12.0–15.0)
MCH: 27.5 pg (ref 26.0–34.0)
MCHC: 30.7 g/dL (ref 30.0–36.0)
MCV: 89.4 fL (ref 80.0–100.0)
Platelets: 226 10*3/uL (ref 150–400)
RBC: 4.44 MIL/uL (ref 3.87–5.11)
RDW: 13.9 % (ref 11.5–15.5)
WBC: 7.8 10*3/uL (ref 4.0–10.5)
nRBC: 0 % (ref 0.0–0.2)

## 2019-05-25 LAB — HIV ANTIBODY (ROUTINE TESTING W REFLEX): HIV Screen 4th Generation wRfx: NONREACTIVE

## 2019-05-25 LAB — GLUCOSE, CAPILLARY
Glucose-Capillary: 233 mg/dL — ABNORMAL HIGH (ref 70–99)
Glucose-Capillary: 238 mg/dL — ABNORMAL HIGH (ref 70–99)
Glucose-Capillary: 199 mg/dL — ABNORMAL HIGH (ref 70–99)
Glucose-Capillary: 183 mg/dL — ABNORMAL HIGH (ref 70–99)

## 2019-05-25 LAB — TROPONIN I (HIGH SENSITIVITY)
Troponin I (High Sensitivity): 253 ng/L (ref <18)
Troponin I (High Sensitivity): 222 ng/L (ref <18)

## 2019-05-25 LAB — ECHOCARDIOGRAM COMPLETE
Height: 66 in
Weight: 3774.28 oz

## 2019-05-25 MED ORDER — INSULIN ASPART 100 UNIT/ML ~~LOC~~ SOLN
0.0000 [IU] | Freq: Three times a day (TID) | SUBCUTANEOUS | Status: DC
  Administered 2019-05-25: 3 [IU] via SUBCUTANEOUS
  Administered 2019-05-25 – 2019-05-26 (×2): 5 [IU] via SUBCUTANEOUS
  Administered 2019-05-26: 3 [IU] via SUBCUTANEOUS

## 2019-05-25 MED ORDER — FUROSEMIDE 10 MG/ML IJ SOLN
40.0000 mg | Freq: Once | INTRAMUSCULAR | Status: AC
  Administered 2019-05-25: 40 mg via INTRAVENOUS
  Filled 2019-05-25: qty 4
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
## 2019-05-25 NOTE — Progress Notes (Signed)
  Echocardiogram 2D Echocardiogram has been performed.  Rebecca Malone 05/25/2019, 4:03 PM

## 2019-05-25 NOTE — Progress Notes (Signed)
PROGRESS NOTE    Rebecca Malone  [id number] DOB: 09/02/1965 DOA: 05/24/2019 PCP: System, Pcp Not In    Brief Narrative:  Rebecca Malone is a 54 y.o. female with medical history significant of type 2 diabetes mellitus, history of MI 2014, essential hypertension, GERD, COPD presented to the ED with complaint of shortness of breath, palpitations, dizziness and chest pain.  Patient reports onset of symptoms early this morning roughly 9 AM when awakening from bed.  She reported severe palpitations associated with chest pain.  Chest pain is described as sharp/stabbing, localized to the substernal region with a feeling of a "back-and-forth sensation".  She denies any other radiation.  Patient also reports dizziness and presyncopal episode.  Patient follows with cardiology, Dr. Keene Breath outpatient.  Patient does admit to being noncompliant with her home medications, reports has not taken many of her medicines over the past 3 days.  She denies any history of A. fib in the past.  IN the ED, temperature 98.5, HR 169, RR 14, BP 111/86, SPO2 95% on room air.  The BC count 10.3, hemoglobin 14.7, platelets 264.  Sodium 136, potassium 3.6, chloride 105, CO2 19, BUN 16, creatinine 1.03, glucose 220.  Magnesium 2.0.  Chest x-ray with pulmonary vascular congestion.  Patient was given Lopressor 5 mg IV x1, metoprolol tartrate 50 mg p.o. x1 and a 500 mL normal saline bolus.  EDP referred patient for admission for further management and evaluation of new onset A. fib with RVR.   Assessment & Plan:   Principal Problem:   Atrial fibrillation with rapid ventricular response (HCC) Active Problems:   OBESITY, MORBID   Non-insulin treated type 2 diabetes mellitus ([place])   Essential hypertension   Atrial fibrillation with RVR, new onset Patient presenting with acute onset palpitations which awoke her from sleep.  Reports similar episodes over the last 2 years but only lasting for 10 minutes. No previous  history of atrial fibrillation.  Additionally, patient reports noncompliance with home medications, especially over the past 3 days which is likely a contributing factor. CHA2DS2-VASc = 4 (female, htn, Hx MI, T2DM).  TSH 1.035, within normal limits.  UDS positive for cocaine. --Cardiology following, appreciate assistance --Continue metoprolol 50 mg p.o. twice daily --Started on Eliquis for anticoagulation --Echocardiogram: Pending --Cardiology considering further ischemic work-up with possible CT coronary scan --Continue to monitor on telemetry  NSTEMI Troponin 235>153>222. EKG with T wave inversions 1, aVL, 2, 3, aVF, V4-V6 and questionable 1-2 mm ST elevation in V2.  T wave inversions similar in appearance to EKG dated August 2020.  Currently chest pain-free. --Cardiology following as above --Continue to monitor on telemetry  Chronic diastolic congestive heart failure Chest x-ray notable for pulmonary vascular congestion.  BNP 182.  Currently oxygenating well on room air. --Receiving 1 dose of furosemide 40 mg IV per cardiology today --Repeat echocardiogram pending as above  Cocaine abuse UDS positive for cocaine.  When questioned patient, states she was at a "infusion food party".  Did not know how she came in contact with this substance.  Counseled on patient need for all illicit drug cessation as this has an overall negative impact on her health especially her cardiovascular health.  Essential hypertension BP 111/86 on admission.  On amlodipine 5 mg, losartan/HCTZ 50-12.5 mg p.o. daily at home. --Continue amlodipine 5 mg p.o. daily --hold combination losartan/HCTZ for now given borderline blood pressure --Continue monitor blood pressure closely  Type 2 diabetes mellitus On Metformin 1000 mg p.o. twice daily  at home. Hemoglobin A1c 8.0, correlating with poor control. --Increase insulin sliding scale to moderate for coverage --CBGs before every meal/at bedtime  COPD On Symbicort  at home.  Currently oxygenating well on room air, no wheezing. --Continue with hospital substitution Anoro Ellipta 1 puff daily  GERD: Continue PPI  Tobacco use disorder: Patient admits to 2 packs of cigarettes per week.  Counseled on need for cessation.  Declines nicotine patch.   DVT prophylaxis: Eliquis Code Status: Full code Family Communication: Updated patient extensively at bedside  Disposition Plan:  Status is: Inpatient  Remains inpatient appropriate because:Ongoing diagnostic testing needed not appropriate for outpatient work up, Unsafe d/c plan and Inpatient level of care appropriate due to severity of illness   Dispo: The patient is from: Home              Anticipated d/c is to: Home              Anticipated d/c date is: 2 days              Patient currently is not medically stable to d/c.   Consultants:   Cardiology  Procedures:   TTE: Pending  Antimicrobials:   None   Subjective: Patient seen and examined bedside, sitting on bedside couch.  States palpitations and chest pain have resolved.  No complaints this morning.  Discussed with her findings of urine drug screen to include cocaine noted, she is unable to clarify how she came in contact with this other than being at a recent "infusion food party".  Patient denies headache, no fever/chills/night sweats, no nausea/vomiting/diarrhea, no chest pain, no palpitations, no shortness of breath, no nausea cefonicid diarrhea, no abdominal pain, no weakness, no fatigue, no paresthesias.  No acute events overnight per nursing staff.  Objective: Vitals:   05/24/19 2236 05/25/19 0228 05/25/19 0526 05/25/19 0912  BP:  136/62 120/61   Pulse:  67 (!) 59   Resp:  19 16   Temp:  98.8 F (37.1 C) 98.5 F (36.9 C)   TempSrc:  Oral Oral   SpO2:  92% 96% 98%  Weight: 107 kg     Height: 5\' 6"  (1.676 m)       Intake/Output Summary (Last 24 hours) at 05/25/2019 1212 Last data filed at 05/24/2019 2300 Gross per 24 hour   Intake 500 ml  Output 300 ml  Net 200 ml   Filed Weights   05/24/19 1407 05/24/19 2236  Weight: 107.5 kg 107 kg    Examination:  General exam: Appears calm and comfortable  Respiratory system: Clear to auscultation. Respiratory effort normal. Cardiovascular system: S1 & S2 heard, RRR. No JVD, murmurs, rubs, gallops or clicks. No pedal edema. Gastrointestinal system: Abdomen is nondistended, soft and nontender. No organomegaly or masses felt. Normal bowel sounds heard. Central nervous system: Alert and oriented. No focal neurological deficits. Extremities: Symmetric 5 x 5 power. Skin: No rashes, lesions or ulcers Psychiatry: Judgement and insight appear normal. Mood & affect appropriate.     Data Reviewed: I have personally reviewed following labs and imaging studies  CBC: Recent Labs  Lab 05/24/19 1317 05/25/19 0452  WBC 10.3 7.8  HGB 14.7 12.2  HCT 45.0 39.7  MCV 87.0 89.4  PLT 264 [id number]   Basic Metabolic Panel: Recent Labs  Lab 05/24/19 1317 05/25/19 0452  NA 136 138  K 3.6 4.0  CL 105 107  CO2 19* 23  GLUCOSE 220* 182*  BUN 16 16  CREATININE 1.03* 0.85  CALCIUM 9.4 8.9  MG 2.0  --    GFR: Estimated Creatinine Clearance: 93.6 mL/min (by C-G formula based on SCr of 0.85 mg/dL). Liver Function Tests: Recent Labs  Lab 05/25/19 0452  AST 18  ALT 18  ALKPHOS 77  BILITOT 0.4  PROT 6.6  ALBUMIN 3.4*   No results for input(s): LIPASE, AMYLASE in the last 168 hours. No results for input(s): AMMONIA in the last 168 hours. Coagulation Profile: No results for input(s): INR, PROTIME in the last 168 hours. Cardiac Enzymes: No results for input(s): CKTOTAL, CKMB, CKMBINDEX, TROPONINI in the last 168 hours. BNP (last 3 results) No results for input(s): PROBNP in the last 8760 hours. HbA1C: Recent Labs    05/24/19 1317  HGBA1C 8.0*   CBG: Recent Labs  Lab 05/24/19 2126 05/25/19 0838  GLUCAP 252* 233*   Lipid Profile: Recent Labs    05/24/19 2241   CHOL 172  HDL 41  LDLCALC 109*  TRIG 108  CHOLHDL 4.2   Thyroid Function Tests: Recent Labs    05/24/19 2241  TSH 1.966  FREET4 0.90   Anemia Panel: No results for input(s): VITAMINB12, FOLATE, FERRITIN, TIBC, IRON, RETICCTPCT in the last 72 hours. Sepsis Labs: No results for input(s): PROCALCITON, LATICACIDVEN in the last 168 hours.  Recent Results (from the past 240 hour(s))  Respiratory Panel by RT PCR (Flu A&B, Covid) - Nasopharyngeal Swab     Status: None   Collection Time: 05/24/19  3:39 PM   Specimen: Nasopharyngeal Swab  Result Value Ref Range Status   SARS Coronavirus 2 by RT PCR NEGATIVE NEGATIVE Final    Comment: (NOTE) SARS-CoV-2 target nucleic acids are NOT DETECTED. The SARS-CoV-2 RNA is generally detectable in upper respiratoy specimens during the acute phase of infection. The lowest concentration of SARS-CoV-2 viral copies this assay can detect is 131 copies/mL. A negative result does not preclude SARS-Cov-2 infection and should not be used as the sole basis for treatment or other patient management decisions. A negative result may occur with  improper specimen collection/handling, submission of specimen other than nasopharyngeal swab, presence of viral mutation(s) within the areas targeted by this assay, and inadequate number of viral copies (<131 copies/mL). A negative result must be combined with clinical observations, patient history, and epidemiological information. The expected result is Negative. Fact Sheet for Patients:  PinkCheek.be Fact Sheet for Healthcare Providers:  GravelBags.it This test is not yet ap proved or cleared by the [place] FDA and  has been authorized for detection and/or diagnosis of SARS-CoV-2 by FDA under an Emergency Use Authorization (EUA). This EUA will remain  in effect (meaning this test can be used) for the duration of the COVID-19 declaration under Section  564(b)(1) of the Act, 21 U.S.C. section 360bbb-3(b)(1), unless the authorization is terminated or revoked sooner.    Influenza A by PCR NEGATIVE NEGATIVE Final   Influenza B by PCR NEGATIVE NEGATIVE Final    Comment: (NOTE) The Xpert Xpress SARS-CoV-2/FLU/RSV assay is intended as an aid in  the diagnosis of influenza from Nasopharyngeal swab specimens and  should not be used as a sole basis for treatment. Nasal washings and  aspirates are unacceptable for Xpert Xpress SARS-CoV-2/FLU/RSV  testing. Fact Sheet for Patients: PinkCheek.be Fact Sheet for Healthcare Providers: GravelBags.it This test is not yet approved or cleared by the [place] FDA and  has been authorized for detection and/or diagnosis of SARS-CoV-2 by  FDA under an Emergency Use Authorization (EUA). This EUA will remain  in effect (meaning this test can be used) for the duration of the  Covid-19 declaration under Section 564(b)(1) of the Act, 21  U.S.C. section 360bbb-3(b)(1), unless the authorization is  terminated or revoked. Performed at [facility], [place] [address] [place], [place] [address]          Radiology Studies: [facility] Chest Port 1 View  Result Date: 05/24/2019 CLINICAL DATA:  Acute shortness of breath and tachycardia. EXAM: PORTABLE CHEST 1 VIEW COMPARISON:  02/05/2018 and prior radiographs FINDINGS: The cardiomediastinal silhouette is unremarkable. Pulmonary vascular congestion is noted. There is no evidence of focal airspace disease, pulmonary edema, suspicious pulmonary nodule/mass, pleural effusion, or pneumothorax. No acute bony abnormalities are identified. IMPRESSION: Pulmonary vascular congestion. Electronically Signed   By: Margarette Canada M.D.   On: 05/24/2019 13:50        Scheduled Meds: . amLODipine  5 mg Oral Daily  . apixaban  5 mg Oral BID  . aspirin EC  81 mg Oral Daily  . fluticasone furoate-vilanterol  1  puff Inhalation Daily  . insulin aspart  0-5 Units Subcutaneous QHS  . insulin aspart  0-9 Units Subcutaneous TID WC  . metoprolol tartrate  50 mg Oral BID  . pantoprazole  40 mg Oral Daily   Continuous Infusions:   LOS: 1 day    Time spent: 36 minutes spent on chart review, discussion with nursing staff, consultants, updating family and interview/physical exam; more than 50% of that time was spent in counseling and/or coordination of care.    Sanaiya Welliver J [place] DO [facility] Available via Epic secure chat 7am-7pm After these hours, please refer to coverage provider listed on [email or web address] 05/25/2019, 12:12 PM

## 2019-05-26 ENCOUNTER — Telehealth: Payer: Self-pay | Admitting: *Deleted

## 2019-05-26 DIAGNOSIS — F1410 Cocaine abuse, uncomplicated: Secondary | ICD-10-CM | POA: Diagnosis present

## 2019-05-26 DIAGNOSIS — I4891 Unspecified atrial fibrillation: Secondary | ICD-10-CM | POA: Diagnosis not present

## 2019-05-26 LAB — GLUCOSE, CAPILLARY
Glucose-Capillary: 187 mg/dL — ABNORMAL HIGH (ref 70–99)
Glucose-Capillary: 201 mg/dL — ABNORMAL HIGH (ref 70–99)

## 2019-05-26 LAB — TROPONIN I (HIGH SENSITIVITY): Troponin I (High Sensitivity): 96 ng/L — ABNORMAL HIGH (ref <18)

## 2019-05-26 MED ORDER — METOPROLOL TARTRATE 50 MG PO TABS: 50.0000 mg | ORAL_TABLET | Freq: Two times a day (BID) | ORAL | 0 refills | Status: DC

## 2019-05-26 MED ORDER — APIXABAN 5 MG PO TABS: 5.0000 mg | ORAL_TABLET | Freq: Two times a day (BID) | ORAL | 0 refills | Status: DC

## 2019-05-26 MED ORDER — ROSUVASTATIN CALCIUM 20 MG PO TABS
20.0000 mg | ORAL_TABLET | Freq: Every day | ORAL | 0 refills | Status: DC
Start: 2019-05-26 — End: 2021-10-27

## 2019-05-26 NOTE — Telephone Encounter (Signed)
-----   Message from Ledora Bottcher, [place] sent at 05/26/2019 12:49 PM EDT -----  Can you also help me arrange an outpatient CT coronary for NSTEMI, precordial chest pain?  Thank you!! PS one of the hospital scanners went down :(   ----- Message ----- From: Ledora Bottcher, PA Sent: 05/26/2019  10:20 AM EDT To: Cv Div Nl Scheduling  Please arrange follow up with Dr. Victorino December team.  I'm happy to see her in 2 weeks - in office visit.  Thanks Angie

## 2019-05-26 NOTE — Progress Notes (Addendum)
Progress Note  Patient Name: Rebecca Malone Date of Encounter: 05/26/2019  Primary Cardiologist: Sanda Klein, MD   Subjective   No further chest pain. Eager to discharge.  Inpatient Medications    Scheduled Meds: . amLODipine  5 mg Oral Daily  . apixaban  5 mg Oral BID  . aspirin EC  81 mg Oral Daily  . fluticasone furoate-vilanterol  1 puff Inhalation Daily  . insulin aspart  0-15 Units Subcutaneous TID WC  . insulin aspart  0-5 Units Subcutaneous QHS  . metoprolol tartrate  50 mg Oral BID  . pantoprazole  40 mg Oral Daily   Continuous Infusions:  PRN Meds: acetaminophen **OR** acetaminophen, diclofenac sodium, ondansetron **OR** ondansetron (ZOFRAN) IV, oxyCODONE, senna-docusate   Vital Signs    Vitals:   05/26/19 0517 05/26/19 0752 05/26/19 0754 05/26/19 0918  BP:    128/74  Pulse:    66  Resp:    18  Temp:    98.6 F (37 C)  TempSrc:    Oral  SpO2:  97% 97% 97%  Weight: 105.9 kg     Height:        Intake/Output Summary (Last 24 hours) at 05/26/2019 1049 Last data filed at 05/26/2019 0932 Gross per 24 hour  Intake 240 ml  Output --  Net 240 ml   Last 3 Weights 05/26/2019 05/24/2019 05/24/2019  Weight (lbs) 233 lb 8 oz 235 lb 14.3 oz 237 lb  Weight (kg) 105.915 kg 107 kg 107.502 kg      Telemetry    Sinus rhythm - sinus bradycardia in the 50-60s - Personally Reviewed  ECG    pending - Personally Reviewed  Physical Exam   GEN: No acute distress.   Neck: No JVD Cardiac: RRR, no murmurs, rubs, or gallops.  Respiratory: Clear to auscultation bilaterally. GI: Soft, nontender, non-distended  MS: No edema; No deformity. Neuro:  Nonfocal  Psych: Normal affect   Labs    High Sensitivity Troponin:   Recent Labs  Lab 05/24/19 2241 05/25/19 0452 05/25/19 0649  TROPONINIHS 235* 253* 222*      Chemistry Recent Labs  Lab 05/24/19 1317 05/25/19 0452  NA 136 138  K 3.6 4.0  CL 105 107  CO2 19* 23  GLUCOSE 220* 182*  BUN 16 16   CREATININE 1.03* 0.85  CALCIUM 9.4 8.9  PROT  --  6.6  ALBUMIN  --  3.4*  AST  --  18  ALT  --  18  ALKPHOS  --  77  BILITOT  --  0.4  GFRNONAA >60 >60  GFRAA >60 >60  ANIONGAP 12 8     Hematology Recent Labs  Lab 05/24/19 1317 05/25/19 0452  WBC 10.3 7.8  RBC 5.17* 4.44  HGB 14.7 12.2  HCT 45.0 39.7  MCV 87.0 89.4  MCH 28.4 27.5  MCHC 32.7 30.7  RDW 13.7 13.9  PLT 264 226    BNP Recent Labs  Lab 05/24/19 2241  BNP 182.5*     DDimer No results for input(s): DDIMER in the last 168 hours.   Radiology    DG Chest Port 1 View  Result Date: 05/24/2019 CLINICAL DATA:  Acute shortness of breath and tachycardia. EXAM: PORTABLE CHEST 1 VIEW COMPARISON:  02/05/2018 and prior radiographs FINDINGS: The cardiomediastinal silhouette is unremarkable. Pulmonary vascular congestion is noted. There is no evidence of focal airspace disease, pulmonary edema, suspicious pulmonary nodule/mass, pleural effusion, or pneumothorax. No acute bony abnormalities are identified. IMPRESSION:  Pulmonary vascular congestion. Electronically Signed   By: Margarette Canada M.D.   On: 05/24/2019 13:50   ECHOCARDIOGRAM COMPLETE  Result Date: 05/25/2019    ECHOCARDIOGRAM REPORT   Patient Name:   Rebecca Malone Date of Exam: 05/25/2019 Medical Rec #:  [id number]        Height:       66.0 in Accession #:    [id number]       Weight:       235.9 lb Date of Birth:  1965-09-24       BSA:          2.145 m Patient Age:    54 years         BP:           120/61 mmHg Patient Gender: F                HR:           59 bpm. Exam Location:  Inpatient Procedure: 2D Echo Indications:    427.31 a-fib  History:        Patient has prior history of Echocardiogram examinations, most                 recent 09/14/2018. Risk Factors:Diabetes, Hypertension and                 Current Smoker.  Sonographer:    Jannett Celestine RDCS (AE) Referring Phys: [id number] ERIC J [place]  Sonographer Comments: Image acquisition challenging due to patient  body habitus. restricted mobility IMPRESSIONS  1. Mild intracavitary gradient noted. Peak velocity 1.66 m/s. Peak gradient 11 mmHg.Marland Kitchen Left ventricular ejection fraction, by estimation, is 60 to 65%. The left ventricle has normal function. The left ventricle has no regional wall motion abnormalities. There is mild concentric left ventricular hypertrophy. Left ventricular diastolic parameters are consistent with Grade II diastolic dysfunction (pseudonormalization). Elevated left ventricular end-diastolic pressure.  2. Right ventricular systolic function is normal. The right ventricular size is normal. There is normal pulmonary artery systolic pressure.  3. The mitral valve is normal in structure. Mild mitral valve regurgitation. No evidence of mitral stenosis.  4. The aortic valve is tricuspid. Aortic valve regurgitation is not visualized. No aortic stenosis is present.  5. The inferior vena cava is normal in size with greater than 50% respiratory variability, suggesting right atrial pressure of 3 mmHg. FINDINGS  Left Ventricle: Mild intracavitary gradient noted. Peak velocity 1.66 m/s. Peak gradient 11 mmHg. Left ventricular ejection fraction, by estimation, is 60 to 65%. The left ventricle has normal function. The left ventricle has no regional wall motion abnormalities. The left ventricular internal cavity size was normal in size. There is mild concentric left ventricular hypertrophy. Left ventricular diastolic parameters are consistent with Grade II diastolic dysfunction (pseudonormalization). Elevated left ventricular end-diastolic pressure. Right Ventricle: The right ventricular size is normal. No increase in right ventricular wall thickness. Right ventricular systolic function is normal. There is normal pulmonary artery systolic pressure. The tricuspid regurgitant velocity is 2.25 m/s, and  with an assumed right atrial pressure of 3 mmHg, the estimated right ventricular systolic pressure is 123XX123 mmHg. Left  Atrium: Left atrial size was normal in size. Right Atrium: Right atrial size was normal in size. Pericardium: There is no evidence of pericardial effusion. Mitral Valve: The mitral valve is normal in structure. Normal mobility of the mitral valve leaflets. Mild mitral valve regurgitation. No evidence of mitral valve stenosis. Tricuspid Valve: The tricuspid valve is normal  in structure. Tricuspid valve regurgitation is mild . No evidence of tricuspid stenosis. Aortic Valve: The aortic valve is tricuspid. Aortic valve regurgitation is not visualized. No aortic stenosis is present. Pulmonic Valve: The pulmonic valve was normal in structure. Pulmonic valve regurgitation is not visualized. No evidence of pulmonic stenosis. Aorta: The aortic root is normal in size and structure. Venous: The inferior vena cava is normal in size with greater than 50% respiratory variability, suggesting right atrial pressure of 3 mmHg. IAS/Shunts: No atrial level shunt detected by color flow Doppler.  LEFT VENTRICLE PLAX 2D LVIDd:         4.60 cm  Diastology LVIDs:         2.80 cm  LV e' lateral:   4.90 cm/s LV PW:         1.20 cm  LV E/e' lateral: 16.4 LV IVS:        1.30 cm  LV e' medial:    5.55 cm/s LVOT diam:     2.00 cm  LV E/e' medial:  14.5 LV SV:         40 LV SV Index:   19 LVOT Area:     3.14 cm  LEFT ATRIUM           Index LA diam:      3.60 cm 1.68 cm/m LA Vol (A4C): 23.2 ml 10.82 ml/m  AORTIC VALVE LVOT Vmax:   82.90 cm/s LVOT Vmean:  62.400 cm/s LVOT VTI:    0.127 m  AORTA Ao Root diam: 2.90 cm MITRAL VALVE               TRICUSPID VALVE MV Area (PHT): 2.17 cm    TR Peak grad:   20.2 mmHg MV Decel Time: 349 msec    TR Vmax:        225.00 cm/s MV E velocity: 80.20 cm/s MV A velocity: 65.20 cm/s  SHUNTS MV E/A ratio:  1.23        Systemic VTI:  0.13 m                            Systemic Diam: 2.00 cm Skeet Latch MD Electronically signed by Skeet Latch MD Signature Date/Time: 05/25/2019/6:08:48 PM    Final      Cardiac Studies   Echo 05/25/19 1. Mild intracavitary gradient noted. Peak velocity 1.66 m/s. Peak  gradient 11 mmHg.Marland Kitchen Left ventricular ejection fraction, by estimation, is  60 to 65%. The left ventricle has normal function. The left ventricle has  no regional wall motion abnormalities.  There is mild concentric left ventricular hypertrophy. Left ventricular  diastolic parameters are consistent with Grade II diastolic dysfunction  (pseudonormalization). Elevated left ventricular end-diastolic pressure.  2. Right ventricular systolic function is normal. The right ventricular  size is normal. There is normal pulmonary artery systolic pressure.  3. The mitral valve is normal in structure. Mild mitral valve  regurgitation. No evidence of mitral stenosis.  4. The aortic valve is tricuspid. Aortic valve regurgitation is not  visualized. No aortic stenosis is present.  5. The inferior vena cava is normal in size with greater than 50%  respiratory variability, suggesting right atrial pressure of 3 mmHg.   Patient Profile     54 y.o. female  with a hx of GERD, HTN, DM2, OSA, palpitations, mild diastolic dysfunction, obesity, asthma, ongoing tobacco use, and COPD who is being seen today for the evaluation of SOB, chest  pain, and Afib  Assessment & Plan    New onset PAF - converted in ER with lopressor - changed BB from bisoprolol to metoprolol  - started eliquis 5 mg Bid This patients CHA2DS2-VASc Score and unadjusted Ischemic Stroke Rate (% per year) is equal to 3.2 % stroke rate/year from a score of 3 (HTN, female, DM) - echo with preserved EF and no WMA   Chest pain Elevated troponin - suspect demand ischemia - ST changes similar to prior - echo reassuring - unable to obtain CT coronary today (scanner down) - will draw one more HS troponin and EKG --> if trending down and EKG stable, will discharge and plan for outpatient CT coronary - will hold off on adding ASA at this  time given eliquis --> add ASA if CAD on CT   Hypertension - continue amlodipine, BB as above, and losartan-HCTZ   Chronic diastolic heart failure - continue HCTZ as above - gave 1 dose of 40 mg IV lasix yesterday, unfortunately no I&Os charted   DM2 - A1c 8% - continue metformin - needs PCP, will likely need additional oral agent   Hyperlipidemia 05/24/2019: Cholesterol 172; HDL 41; LDL Cholesterol 109; Triglycerides 108; VLDL 22 - given family history, smoking, DM, obesity, and HTN - would aim for LDL less than 100, closer to 70 - start 20 mg crestor at discharge   Polysubstance abuse - suspects she ingested cocaine in an alcoholic beverage; avoided marijuana crusted chicken - long discussion about avoiding cocaine with BB and for PAF    [facility] will sign off if CE and EKG stable and after Dr. Percival Spanish sees. Medication Recommendations:  As above Other recommendations (labs, testing, etc):  CT coronary OP Follow up as an outpatient:  Follow up made  For questions or updates, please contact [facility] Please consult [email or web address] for contact info under        Signed, Ledora Bottcher, PA  05/26/2019, 10:49 AM    History and all data above reviewed.  Patient examined.  I agree with the findings as above. She is anxious to go. She denies pain or SOB. HR has been NSR, SB  The patient exam reveals COR:RRR  ,  Lungs: Clear  ,  Abd: Positive bowel sounds, no rebound no guarding, Ext No edema  .  All available labs, radiology testing, previous records reviewed. Agree with documented assessment and plan.   Atrial fib:  Now in NSR.  Started on anticoagulation with appropriate education and no contraindication.  Elevated enzymes:  Plan out patient CT. Discharge if trop trending down.   Rebecca Malone  11:41 AM  05/26/2019

## 2019-05-26 NOTE — Plan of Care (Signed)

## 2019-05-26 NOTE — Telephone Encounter (Signed)
Patient to be discharged today 4/30.

## 2019-05-26 NOTE — Discharge Instructions (Signed)
 Atrial Fibrillation  Atrial fibrillation is a type of irregular or rapid heartbeat (arrhythmia). In atrial fibrillation, the top part of the heart (atria) beats in an irregular pattern. This makes the heart unable to pump blood normally and effectively. The goal of treatment is to prevent blood clots from forming, control your heart rate, or restore your heartbeat to a normal rhythm. If this condition is not treated, it can cause serious problems, such as a weakened heart muscle (cardiomyopathy) or a stroke. What are the causes? This condition is often caused by medical conditions that damage the heart's electrical system. These include:  High blood pressure (hypertension). This is the most common cause.  Certain heart problems or conditions, such as heart failure, coronary artery disease, heart valve problems, or heart surgery.  Diabetes.  Overactive thyroid (hyperthyroidism).  Obesity.  Chronic kidney disease. In some cases, the cause of this condition is not known. What increases the risk? This condition is more likely to develop in:  Older people.  People who smoke.  Athletes who do endurance exercise.  People who have a family history of atrial fibrillation.  Men.  People who use drugs.  People who drink a lot of alcohol.  People who have lung conditions, such as emphysema, pneumonia, or COPD.  People who have obstructive sleep apnea. What are the signs or symptoms? Symptoms of this condition include:  A feeling that your heart is racing or beating irregularly.  Discomfort or pain in your chest.  Shortness of breath.  Sudden light-headedness or weakness.  Tiring easily during exercise or activity.  Fatigue.  Syncope (fainting).  Sweating. In some cases, there are no symptoms. How is this diagnosed? Your health care provider may detect atrial fibrillation when taking your pulse. If detected, this condition may be diagnosed with:  An  electrocardiogram (ECG) to check electrical signals of the heart.  An ambulatory cardiac monitor to record your heart's activity for a few days.  A transthoracic echocardiogram (TTE) to create pictures of your heart.  A transesophageal echocardiogram (TEE) to create even closer pictures of your heart.  A stress test to check your blood supply while you exercise.  Imaging tests, such as a CT scan or chest X-ray.  Blood tests. How is this treated? Treatment depends on underlying conditions and how you feel when you experience atrial fibrillation. This condition may be treated with:  Medicines to prevent blood clots or to treat heart rate or heart rhythm problems.  Electrical cardioversion to reset the heart's rhythm.  A pacemaker to correct abnormal heart rhythm.  Ablation to remove the heart tissue that sends abnormal signals.  Left atrial appendage closure to seal the area where blood clots can form. In some cases, underlying conditions will be treated. Follow these instructions at home: Medicines  Take over-the counter and prescription medicines only as told by your health care provider.  Do not take any new medicines without talking to your health care provider.  If you are taking blood thinners: ? Talk with your health care provider before you take any medicines that contain aspirin or NSAIDs, such as ibuprofen. These medicines increase your risk for dangerous bleeding. ? Take your medicine exactly as told, at the same time every day. ? Avoid activities that could cause injury or bruising, and follow instructions about how to prevent falls. ? Wear a medical alert bracelet or carry a card that lists what medicines you take. Lifestyle      Do not use any   products that contain nicotine or tobacco, such as cigarettes, e-cigarettes, and chewing tobacco. If you need help quitting, ask your health care provider.  Eat heart-healthy foods. Talk with a dietitian to make an  eating plan that is right for you.  Exercise regularly as told by your health care provider.  Do not drink alcohol.  Lose weight if you are overweight.  Do not use drugs, including cannabis. General instructions  If you have obstructive sleep apnea, manage your condition as told by your health care provider.  Do not use diet pills unless your health care provider approves. Diet pills can make heart problems worse.  Keep all follow-up visits as told by your health care provider. This is important. Contact a health care provider if you:  Notice a change in the rate, rhythm, or strength of your heartbeat.  Are taking a blood thinner and you notice more bruising.  Tire more easily when you exercise or do heavy work.  Have a sudden change in weight. Get help right away if you have:   Chest pain, abdominal pain, sweating, or weakness.  Trouble breathing.  Side effects of blood thinners, such as blood in your vomit, stool, or urine, or bleeding that cannot stop.  Any symptoms of a stroke. "BE FAST" is an easy way to remember the main warning signs of a stroke: ? B - Balance. Signs are dizziness, sudden trouble walking, or loss of balance. ? E - Eyes. Signs are trouble seeing or a sudden change in vision. ? F - Face. Signs are sudden weakness or numbness of the face, or the face or eyelid drooping on one side. ? A - Arms. Signs are weakness or numbness in an arm. This happens suddenly and usually on one side of the body. ? S - Speech. Signs are sudden trouble speaking, slurred speech, or trouble understanding what people say. ? T - Time. Time to call emergency services. Write down what time symptoms started.  Other signs of a stroke, such as: ? A sudden, severe headache with no known cause. ? Nausea or vomiting. ? Seizure. These symptoms may represent a serious problem that is an emergency. Do not wait to see if the symptoms will go away. Get medical help right away. Call your  local emergency services (911 in the [place] Do not drive yourself to the hospital. Summary  Atrial fibrillation is a type of irregular or rapid heartbeat (arrhythmia).  Symptoms include a feeling that your heart is beating fast or irregularly.  You may be given medicines to prevent blood clots or to treat heart rate or heart rhythm problems.  Get help right away if you have signs or symptoms of a stroke.  Get help right away if you cannot catch your breath or have chest pain or pressure. This information is not intended to replace advice given to you by your health care provider. Make sure you discuss any questions you have with your health care provider. Document Revised: 07/06/2018 Document Reviewed: 07/06/2018 [employer] Patient Education  2020 [employer] Information on my medicine - ELIQUIS (apixaban)  This medication education was reviewed with me or my healthcare representative as part of my discharge preparation.   Why was Eliquis prescribed for you? Eliquis was prescribed for you to reduce the risk of a blood clot forming that can cause a stroke if you have a medical condition called atrial fibrillation (a type of irregular heartbeat).  What do You need to know about Eliquis ? Take   your Eliquis TWICE DAILY - one tablet in the morning and one tablet in the evening with or without food. If you have difficulty swallowing the tablet whole please discuss with your pharmacist how to take the medication safely.  Take Eliquis exactly as prescribed by your doctor and DO NOT stop taking Eliquis without talking to the doctor who prescribed the medication.  Stopping may increase your risk of developing a stroke.  Refill your prescription before you run out.  After discharge, you should have regular check-up appointments with your healthcare provider that is prescribing your Eliquis.  In the future your dose may need to be changed if your kidney function or weight changes by a  significant amount or as you get older.  What do you do if you miss a dose? If you miss a dose, take it as soon as you remember on the same day and resume taking twice daily.  Do not take more than one dose of ELIQUIS at the same time to make up a missed dose.  Important Safety Information A possible side effect of Eliquis is bleeding. You should call your healthcare provider right away if you experience any of the following: ? Bleeding from an injury or your nose that does not stop. ? Unusual colored urine (red or dark brown) or unusual colored stools (red or black). ? Unusual bruising for unknown reasons. ? A serious fall or if you hit your head (even if there is no bleeding).  Some medicines may interact with Eliquis and might increase your risk of bleeding or clotting while on Eliquis. To help avoid this, consult your healthcare provider or pharmacist prior to using any new prescription or non-prescription medications, including herbals, vitamins, non-steroidal anti-inflammatory drugs (NSAIDs) and supplements.  This website has more information on Eliquis (apixaban): http://[email or web address].[email or web address]/home  

## 2019-05-26 NOTE — Discharge Summary (Signed)
Physician Discharge Summary  Rebecca Malone [id number] DOB: 1965/11/15 DOA: 05/24/2019  PCP: System, Pcp Not In  Admit date: 05/24/2019 Discharge date: 05/26/2019  Admitted From: Home Disposition: Home  Recommendations for Outpatient Follow-up:  1. Follow up with PCP in 1-2 weeks 2. Follow-up with cardiology, Dr. Sallyanne Kuster as scheduled 3. Started on metoprolol 50 mg p.o. twice daily for rate control of atrial fibrillation 4. Started on Eliquis for anticoagulation 5. Started on Crestor 20 mg p.o. daily 6. Will need consideration of a CT cardiac angiogram outpatient 7. We will need further optimization of her diabetes with hemoglobin A1c 8.0 8. Continue to discuss substance abuse cessation  Home Health: No Equipment/Devices: None  Discharge Condition: Stable CODE STATUS: Full code Diet recommendation: Diabetic/cardiac diet  History of present illness:  Rebecca Malone is a 54 y.o.femalewith medical history significant oftype 2 diabetes mellitus, history of MI 2014,essential hypertension, GERD, COPD presented to the ED with complaint of shortness of breath, palpitations,dizziness and chest pain. Patient reports onset of symptoms early this morning roughly 9 AM when awakening from bed. She reported severe palpitations associated with chest pain.Chest pain is described as sharp/stabbing,localized to the substernal region with a feeling of a "back-and-forth sensation". She denies any other radiation.Patient also reports dizziness and presyncopal episode.  Patient follows with cardiology, Dr. Maryan Char. Patient does admit to being noncompliant with her home medications, reports has not taken many of her medicines over the past 3 days. She denies any history of A. fib in the past.  In the ED, temperature 98.5, HR 169, RR 14, BP 111/86, SPO2 95% on room air. The BC count 10.3, hemoglobin 14.7, platelets 264. Sodium 136, potassium 3.6, chloride 105, CO2 19, BUN  16, creatinine 1.03, glucose 220. Magnesium 2.0.Chest x-ray with pulmonary vascular congestion. Patient was given Lopressor 5 mg IV x1, metoprolol tartrate 50 mg p.o. x1 and a 500 mL normal saline bolus. EDP referred patient for admission for further management and evaluation of new onset A. fib with RVR.  Hospital course:  Atrial fibrillation with RVR, new onset Patient presenting with acute onset palpitations which awoke her from sleep.  Reports similar episodes over the last 2 years but only lasting for 10 minutes. No previous history of atrial fibrillation. Additionally, patient reports noncompliance with home medications, especially over the past 3 days which is likely a contributing factor. CHA2DS2-VASc = 4 (female, htn, Hx MI, T2DM).  TSH 1.035, within normal limits.  UDS positive for cocaine.  Cardiology was consulted and followed during hospital course.  Patient was started on metoprolol 50 mg p.o. twice daily with good control of her heart rate.  Echocardiogram notable for EF 60-65%, no LV regional wall motion abnormalities, mild concentric LVH and grade 2 diastolic dysfunction.  Started on Eliquis for anticoagulation.  Continue losartan/HCTZ.  Continue aspirin.  Outpatient follow-up with cardiology with consideration of CT coronary angiogram.  Continue encourage substance use cessation.  NSTEMI Troponin 235>153>222>96. EKG with T wave inversions 1, aVL, 2, 3, aVF, V4-V6 and questionable 1-2 mm ST elevation in V2.  T wave inversions similar in appearance to EKG dated August 2020.  Currently chest pain-free.  No further cardio vascular work-up inpatient per cardiology.  Continue discussions of cessation of cocaine/tobacco.  Chronic diastolic congestive heart failure Chest x-ray notable for pulmonary vascular congestion.  BNP 182.  Currently oxygenating well on room air.  Received 1 dose of IV Lasix while inpatient.  Repeat TTE with EF 60 to 123456, grade 2 diastolic  dysfunction.  On  beta-blocker and ARB.  Started on a statin.  Continue aspirin.  Cocaine abuse UDS positive for cocaine.  When questioned patient, states she was at a "infusion food party".  Did not know how she came in contact with this substance.  Counseled on patient need for all illicit drug cessation as this has an overall negative impact on her health especially her cardiovascular health.  Essential hypertension Continue amlodipine 5 mg, losartan/HCTZ 50-12.5 mg p.o. daily, metoprolol tartrate 50 mg p.o. twice daily.  Started on Crestor 20 mg p.o. daily.  Continue aspirin.    Type 2 diabetes mellitus On Metformin 1000 mg p.o. twice daily at home.Hemoglobin A1c 8.0, correlating with poor control.  Follow-up with PCP for guidance on further optimization of glucose control.  COPD Continue Symbicort at home. Oxygenating well on room air, no wheezing.  GERD:Continue PPI  Tobacco use disorder: Patient admits to 2 packs of cigarettes per week.  Counseled on need for cessation.  Declines nicotine patch.  Discharge Diagnoses:  Principal Problem:   Atrial fibrillation with rapid ventricular response (HCC) Active Problems:   OBESITY, MORBID   Non-insulin treated type 2 diabetes mellitus (HCC)   Essential hypertension   Cocaine abuse [facility])    Discharge Instructions  Discharge Instructions    Amb referral to [facility]   Complete by: As directed    Call MD for:  difficulty breathing, headache or visual disturbances   Complete by: As directed    Call MD for:  extreme fatigue   Complete by: As directed    Call MD for:  persistant dizziness or light-headedness   Complete by: As directed    Call MD for:  persistant nausea and vomiting   Complete by: As directed    Call MD for:  severe uncontrolled pain   Complete by: As directed    Call MD for:  temperature >100.4   Complete by: As directed    Diet - low sodium heart healthy   Complete by: As directed    Increase activity slowly    Complete by: As directed      Allergies as of 05/26/2019      Reactions   Advair Diskus [fluticasone-salmeterol] Other (See Comments)   Made sores on the back of her throat   Other Hives, Itching   Patient states she is allergic to all generic medications. Can only have [place]    Religious       Medication List    STOP taking these medications   bisoprolol 10 MG tablet Commonly known as: ZEBETA     TAKE these medications   albuterol (2.5 MG/3ML) 0.083% nebulizer solution Commonly known as: PROVENTIL Take 3 mLs (2.5 mg total) by nebulization every 6 (six) hours as needed for wheezing or shortness of breath. 1 time refill for patient send to PCP   amLODipine 5 MG tablet Commonly known as: NORVASC TAKE 1 TABLET(5 MG) BY MOUTH DAILY What changed:   how much to take  how to take this  when to take this  additional instructions   apixaban 5 MG Tabs tablet Commonly known as: ELIQUIS Take 1 tablet (5 mg total) by mouth 2 (two) times daily.   aspirin 81 MG EC tablet Take 1 tablet (81 mg total) by mouth daily.   budesonide-formoterol 80-4.5 MCG/ACT inhaler Commonly known as: Symbicort Take 2 puffs first thing in am and then another 2 puffs about 12 hours later.  diclofenac sodium 1 % Gel Commonly known as: VOLTAREN Apply 4 g topically 4 (four) times daily as needed (knee pain).   famotidine 20 MG tablet Commonly known as: PEPCID TAKE 1 TABLET(20 MG) BY MOUTH AT BEDTIME What changed:   how much to take  how to take this  when to take this  additional instructions   HYDROcodone-acetaminophen 5-325 MG tablet Commonly known as: NORCO/VICODIN Take 1 tablet by mouth every 6 (six) hours as needed for pain.   losartan-hydrochlorothiazide 50-12.5 MG tablet Commonly known as: Hyzaar Take 1 tablet by mouth daily.   metFORMIN 1000 MG tablet Commonly known as: GLUCOPHAGE Take 1 tablet (1,000 mg total) by mouth 2 (two) times  daily with a meal.   metoprolol tartrate 50 MG tablet Commonly known as: LOPRESSOR Take 1 tablet (50 mg total) by mouth 2 (two) times daily.   naproxen sodium 220 MG tablet Commonly known as: ALEVE Take 440 mg by mouth daily as needed (pain).   pantoprazole 40 MG tablet Commonly known as: Protonix Take 1 tablet (40 mg total) by mouth daily. Take 30-60 min before first meal of the day   rosuvastatin 20 MG tablet Commonly known as: Crestor Take 1 tablet (20 mg total) by mouth daily.      Follow-up Information    Croitoru, Mihai, MD. Schedule an appointment as soon as possible for a visit in 1 week(s).   Specialty: Cardiology Contact information: [address] [place] [address] [phone]          Allergies  Allergen Reactions  . Advair Diskus [Fluticasone-Salmeterol] Other (See Comments)    Made sores on the back of her throat  . Other Hives and Itching    Patient states she is allergic to all generic medications. Can only have [place]  . Pork-Derived Products     Religious     Consultations:  Cardiology - Dr. Percival Spanish   Procedures/Studies: DG Chest Port 1 View  Result Date: 05/24/2019 CLINICAL DATA:  Acute shortness of breath and tachycardia. EXAM: PORTABLE CHEST 1 VIEW COMPARISON:  02/05/2018 and prior radiographs FINDINGS: The cardiomediastinal silhouette is unremarkable. Pulmonary vascular congestion is noted. There is no evidence of focal airspace disease, pulmonary edema, suspicious pulmonary nodule/mass, pleural effusion, or pneumothorax. No acute bony abnormalities are identified. IMPRESSION: Pulmonary vascular congestion. Electronically Signed   By: Margarette Canada M.D.   On: 05/24/2019 13:50   ECHOCARDIOGRAM COMPLETE  Result Date: 05/25/2019    ECHOCARDIOGRAM REPORT   Patient Name:   Rebecca Malone Date of Exam: 05/25/2019 Medical Rec #:  [id number]        Height:       66.0 in Accession #:    [id number]       Weight:        235.9 lb Date of Birth:  1965/06/07       BSA:          2.145 m Patient Age:    69 years         BP:           120/61 mmHg Patient Gender: F                HR:           59 bpm. Exam Location:  Inpatient Procedure: 2D Echo Indications:    427.31 a-fib  History:        Patient has prior history of Echocardiogram examinations, most  recent 09/14/2018. Risk Factors:Diabetes, Hypertension and                 Current Smoker.  Sonographer:    Jannett Celestine RDCS (AE) Referring Phys: [id number] Seng Larch J [place]  Sonographer Comments: Image acquisition challenging due to patient body habitus. restricted mobility IMPRESSIONS  1. Mild intracavitary gradient noted. Peak velocity 1.66 m/s. Peak gradient 11 mmHg.[place] Kitchen Left ventricular ejection fraction, by estimation, is 60 to 65%. The left ventricle has normal function. The left ventricle has no regional wall motion abnormalities. There is mild concentric left ventricular hypertrophy. Left ventricular diastolic parameters are consistent with Grade II diastolic dysfunction (pseudonormalization). Elevated left ventricular end-diastolic pressure.  2. Right ventricular systolic function is normal. The right ventricular size is normal. There is normal pulmonary artery systolic pressure.  3. The mitral valve is normal in structure. Mild mitral valve regurgitation. No evidence of mitral stenosis.  4. The aortic valve is tricuspid. Aortic valve regurgitation is not visualized. No aortic stenosis is present.  5. The inferior vena cava is normal in size with greater than 50% respiratory variability, suggesting right atrial pressure of 3 mmHg. FINDINGS  Left Ventricle: Mild intracavitary gradient noted. Peak velocity 1.66 m/s. Peak gradient 11 mmHg. Left ventricular ejection fraction, by estimation, is 60 to 65%. The left ventricle has normal function. The left ventricle has no regional wall motion abnormalities. The left ventricular internal cavity size was normal in size. There is  mild concentric left ventricular hypertrophy. Left ventricular diastolic parameters are consistent with Grade II diastolic dysfunction (pseudonormalization). Elevated left ventricular end-diastolic pressure. Right Ventricle: The right ventricular size is normal. No increase in right ventricular wall thickness. Right ventricular systolic function is normal. There is normal pulmonary artery systolic pressure. The tricuspid regurgitant velocity is 2.25 m/s, and  with an assumed right atrial pressure of 3 mmHg, the estimated right ventricular systolic pressure is 123XX123 mmHg. Left Atrium: Left atrial size was normal in size. Right Atrium: Right atrial size was normal in size. Pericardium: There is no evidence of pericardial effusion. Mitral Valve: The mitral valve is normal in structure. Normal mobility of the mitral valve leaflets. Mild mitral valve regurgitation. No evidence of mitral valve stenosis. Tricuspid Valve: The tricuspid valve is normal in structure. Tricuspid valve regurgitation is mild . No evidence of tricuspid stenosis. Aortic Valve: The aortic valve is tricuspid. Aortic valve regurgitation is not visualized. No aortic stenosis is present. Pulmonic Valve: The pulmonic valve was normal in structure. Pulmonic valve regurgitation is not visualized. No evidence of pulmonic stenosis. Aorta: The aortic root is normal in size and structure. Venous: The inferior vena cava is normal in size with greater than 50% respiratory variability, suggesting right atrial pressure of 3 mmHg. IAS/Shunts: No atrial level shunt detected by color flow Doppler.  LEFT VENTRICLE PLAX 2D LVIDd:         4.60 cm  Diastology LVIDs:         2.80 cm  LV e' lateral:   4.90 cm/s LV PW:         1.20 cm  LV E/e' lateral: 16.4 LV IVS:        1.30 cm  LV e' medial:    5.55 cm/s LVOT diam:     2.00 cm  LV E/e' medial:  14.5 LV SV:         40 LV SV Index:   19 LVOT Area:     3.14 cm  LEFT ATRIUM  Index LA diam:      3.60 cm 1.68 cm/m LA  Vol (A4C): 23.2 ml 10.82 ml/m  AORTIC VALVE LVOT Vmax:   82.90 cm/s LVOT Vmean:  62.400 cm/s LVOT VTI:    0.127 m  AORTA Ao Root diam: 2.90 cm MITRAL VALVE               TRICUSPID VALVE MV Area (PHT): 2.17 cm    TR Peak grad:   20.2 mmHg MV Decel Time: 349 msec    TR Vmax:        225.00 cm/s MV E velocity: 80.20 cm/s MV A velocity: 65.20 cm/s  SHUNTS MV E/A ratio:  1.23        Systemic VTI:  0.13 m                            Systemic Diam: 2.00 cm Skeet Latch MD Electronically signed by Skeet Latch MD Signature Date/Time: 05/25/2019/6:08:48 PM    Final       Subjective: Patient seen and examined at bedside, resting calmly.  Ready for discharge home.  States she has a "Ribeye waiting for her".  Atrial fibrillation now rate controlled.  Started on statin and NOAC.  No further work-up per cardiology.  Patient denies headache, no fever/chills/night sweats, no nausea/vomiting/diarrhea, no chest pain, no palpitations, no shortness of breath, no abdominal pain, no weakness, no fatigue, no paresthesias.  No acute events overnight per nursing staff.  Discharge Exam: Vitals:   05/26/19 0754 05/26/19 0918  BP:  128/74  Pulse:  66  Resp:  18  Temp:  98.6 F (37 C)  SpO2: 97% 97%   Vitals:   05/26/19 0517 05/26/19 0752 05/26/19 0754 05/26/19 0918  BP:    128/74  Pulse:    66  Resp:    18  Temp:    98.6 F (37 C)  TempSrc:    Oral  SpO2:  97% 97% 97%  Weight: 105.9 kg     Height:        General: Pt is alert, awake, not in acute distress Cardiovascular: RRR, S1/S2 +, no rubs, no gallops Respiratory: CTA bilaterally, no wheezing, no rhonchi Abdominal: Soft, NT, ND, bowel sounds + Extremities: no edema, no cyanosis    The results of significant diagnostics from this hospitalization (including imaging, microbiology, ancillary and laboratory) are listed below for reference.     Microbiology: Recent Results (from the past 240 hour(s))  Respiratory Panel by RT PCR (Flu A&B, Covid) -  Nasopharyngeal Swab     Status: None   Collection Time: 05/24/19  3:39 PM   Specimen: Nasopharyngeal Swab  Result Value Ref Range Status   SARS Coronavirus 2 by RT PCR NEGATIVE NEGATIVE Final    Comment: (NOTE) SARS-CoV-2 target nucleic acids are NOT DETECTED. The SARS-CoV-2 RNA is generally detectable in upper respiratoy specimens during the acute phase of infection. The lowest concentration of SARS-CoV-2 viral copies this assay can detect is 131 copies/mL. A negative result does not preclude SARS-Cov-2 infection and should not be used as the sole basis for treatment or other patient management decisions. A negative result may occur with  improper specimen collection/handling, submission of specimen other than nasopharyngeal swab, presence of viral mutation(s) within the areas targeted by this assay, and inadequate number of viral copies (<131 copies/mL). A negative result must be combined with clinical observations, patient history, and epidemiological information. The expected result is Negative.  Fact Sheet for Patients:  PinkCheek.be Fact Sheet for Healthcare Providers:  GravelBags.it This test is not yet ap proved or cleared by the [place] FDA and  has been authorized for detection and/or diagnosis of SARS-CoV-2 by FDA under an Emergency Use Authorization (EUA). This EUA will remain  in effect (meaning this test can be used) for the duration of the COVID-19 declaration under Section 564(b)(1) of the Act, 21 U.S.C. section 360bbb-3(b)(1), unless the authorization is terminated or revoked sooner.    Influenza A by PCR NEGATIVE NEGATIVE Final   Influenza B by PCR NEGATIVE NEGATIVE Final    Comment: (NOTE) The Xpert Xpress SARS-CoV-2/FLU/RSV assay is intended as an aid in  the diagnosis of influenza from Nasopharyngeal swab specimens and  should not be used as a sole basis for treatment. Nasal washings and  aspirates  are unacceptable for Xpert Xpress SARS-CoV-2/FLU/RSV  testing. Fact Sheet for Patients: PinkCheek.be Fact Sheet for Healthcare Providers: GravelBags.it This test is not yet approved or cleared by the [place] FDA and  has been authorized for detection and/or diagnosis of SARS-CoV-2 by  FDA under an Emergency Use Authorization (EUA). This EUA will remain  in effect (meaning this test can be used) for the duration of the  Covid-19 declaration under Section 564(b)(1) of the Act, 21  U.S.C. section 360bbb-3(b)(1), unless the authorization is  terminated or revoked. Performed at [facility], [place] [address], [place], [place] [address]      Labs: BNP (last 3 results) Recent Labs    05/24/19 2241  BNP 99991111*   Basic Metabolic Panel: Recent Labs  Lab 05/24/19 1317 05/25/19 0452  NA 136 138  K 3.6 4.0  CL 105 107  CO2 19* 23  GLUCOSE 220* 182*  BUN 16 16  CREATININE 1.03* 0.85  CALCIUM 9.4 8.9  MG 2.0  --    Liver Function Tests: Recent Labs  Lab 05/25/19 0452  AST 18  ALT 18  ALKPHOS 77  BILITOT 0.4  PROT 6.6  ALBUMIN 3.4*   No results for input(s): LIPASE, AMYLASE in the last 168 hours. No results for input(s): AMMONIA in the last 168 hours. CBC: Recent Labs  Lab 05/24/19 1317 05/25/19 0452  WBC 10.3 7.8  HGB 14.7 12.2  HCT 45.0 39.7  MCV 87.0 89.4  PLT 264 226   Cardiac Enzymes: No results for input(s): CKTOTAL, CKMB, CKMBINDEX, TROPONINI in the last 168 hours. BNP: Invalid input(s): POCBNP CBG: Recent Labs  Lab 05/25/19 1258 05/25/19 1653 05/25/19 1953 05/26/19 0718 05/26/19 1216  GLUCAP 238* 199* 183* 187* 201*   D-Dimer No results for input(s): DDIMER in the last 72 hours. Hgb A1c Recent Labs    05/24/19 1317  HGBA1C 8.0*   Lipid Profile Recent Labs    05/24/19 2241  CHOL 172  HDL 41  LDLCALC 109*  TRIG 108  CHOLHDL 4.2   Thyroid function  studies Recent Labs    05/24/19 2241  TSH 1.966   Anemia work up No results for input(s): VITAMINB12, FOLATE, FERRITIN, TIBC, IRON, RETICCTPCT in the last 72 hours. Urinalysis    Component Value Date/Time   COLORURINE YELLOW 04/11/2018 2038   APPEARANCEUR HAZY (A) 04/11/2018 2038   LABSPEC 1.031 (H) 04/11/2018 2038   PHURINE 5.0 04/11/2018 2038   GLUCOSEU >=500 (A) 04/11/2018 2038   HGBUR NEGATIVE 04/11/2018 2038   BILIRUBINUR NEGATIVE 04/11/2018 2038   KETONESUR 5 (A) 04/11/2018 2038   PROTEINUR NEGATIVE 04/11/2018 2038  UROBILINOGEN 1.0 11/13/2009 2057   NITRITE NEGATIVE 04/11/2018 2038   LEUKOCYTESUR TRACE (A) 04/11/2018 2038   Sepsis Labs Invalid input(s): PROCALCITONIN,  WBC,  LACTICIDVEN Microbiology Recent Results (from the past 240 hour(s))  Respiratory Panel by RT PCR (Flu A&B, Covid) - Nasopharyngeal Swab     Status: None   Collection Time: 05/24/19  3:39 PM   Specimen: Nasopharyngeal Swab  Result Value Ref Range Status   SARS Coronavirus 2 by RT PCR NEGATIVE NEGATIVE Final    Comment: (NOTE) SARS-CoV-2 target nucleic acids are NOT DETECTED. The SARS-CoV-2 RNA is generally detectable in upper respiratoy specimens during the acute phase of infection. The lowest concentration of SARS-CoV-2 viral copies this assay can detect is 131 copies/mL. A negative result does not preclude SARS-Cov-2 infection and should not be used as the sole basis for treatment or other patient management decisions. A negative result may occur with  improper specimen collection/handling, submission of specimen other than nasopharyngeal swab, presence of viral mutation(s) within the areas targeted by this assay, and inadequate number of viral copies (<131 copies/mL). A negative result must be combined with clinical observations, patient history, and epidemiological information. The expected result is Negative. Fact Sheet for Patients:  PinkCheek.be Fact  Sheet for Healthcare Providers:  GravelBags.it This test is not yet ap proved or cleared by the [place] FDA and  has been authorized for detection and/or diagnosis of SARS-CoV-2 by FDA under an Emergency Use Authorization (EUA). This EUA will remain  in effect (meaning this test can be used) for the duration of the COVID-19 declaration under Section 564(b)(1) of the Act, 21 U.S.C. section 360bbb-3(b)(1), unless the authorization is terminated or revoked sooner.    Influenza A by PCR NEGATIVE NEGATIVE Final   Influenza B by PCR NEGATIVE NEGATIVE Final    Comment: (NOTE) The Xpert Xpress SARS-CoV-2/FLU/RSV assay is intended as an aid in  the diagnosis of influenza from Nasopharyngeal swab specimens and  should not be used as a sole basis for treatment. Nasal washings and  aspirates are unacceptable for Xpert Xpress SARS-CoV-2/FLU/RSV  testing. Fact Sheet for Patients: PinkCheek.be Fact Sheet for Healthcare Providers: GravelBags.it This test is not yet approved or cleared by the [place] FDA and  has been authorized for detection and/or diagnosis of SARS-CoV-2 by  FDA under an Emergency Use Authorization (EUA). This EUA will remain  in effect (meaning this test can be used) for the duration of the  Covid-19 declaration under Section 564(b)(1) of the Act, 21  U.S.C. section 360bbb-3(b)(1), unless the authorization is  terminated or revoked. Performed at [facility], [place] [address] [place], [place] [address]      Time coordinating discharge: Over 30 minutes  SIGNED:   Karelly Dewalt J [place] DO  [facility] 05/26/2019, 12:53 PM

## 2019-05-26 NOTE — Care Management Important Message (Signed)
Important Message  Patient Details IM Letter given to Dessa Phi RN Case Manager to present to the Patient Name: Rebecca Malone MRN: [id number] Date of Birth: October 11, 1965   Medicare Important Message Given:  Yes     Kerin Salen 05/26/2019, 12:59 PM

## 2019-05-29 ENCOUNTER — Telehealth: Payer: Self-pay | Admitting: *Deleted

## 2019-05-29 DIAGNOSIS — R072 Precordial pain: Secondary | ICD-10-CM

## 2019-05-29 DIAGNOSIS — I214 Non-ST elevation (NSTEMI) myocardial infarction: Secondary | ICD-10-CM

## 2019-05-29 NOTE — Telephone Encounter (Signed)
Order placed for stat coronary CTA.  OV 5/5-will need instructions provided at appointment.

## 2019-05-29 NOTE — Telephone Encounter (Addendum)
-----   Message from Ledora Bottcher, [place] sent at 05/26/2019 12:49 PM EDT -----  Can you also help me arrange an outpatient CT coronary for NSTEMI, precordial chest pain?  Thank you!! PS one of the hospital scanners went down :(

## 2019-05-29 NOTE — Telephone Encounter (Signed)
Patient contacted regarding discharge from [facility] on 05/26/19. Patient understands to follow up with provider Almyra Deforest PA on 05/31/19 at 9:15am at [facility]. Patient understands discharge instructions? YES Patient understands medications and regiment? YES Patient understands to bring all medications to this visit? YES  Ask patient:  Are you enrolled in My Chart (yes or no)  If no ask patient if they would like to enroll.   Metformin 2000mg  - she does not think she needs to take that high of a dose - she reports she breaks out into a sweat, tears up her stomach, excessive diarrhea - she had been on this dose previously and stopped med d/t side effects - she has an endocrinologist now - will follow up with them  She reports is "not able to function"  She will discuss her med concerns with PA on 05/31/19 - advised to continue meds until then

## 2019-05-30 NOTE — Telephone Encounter (Signed)
I will make sure patient is given the instructions at her appointment tomorrow 05/31/19.

## 2019-05-31 ENCOUNTER — Other Ambulatory Visit: Payer: Self-pay

## 2019-05-31 ENCOUNTER — Ambulatory Visit (INDEPENDENT_AMBULATORY_CARE_PROVIDER_SITE_OTHER): Payer: Medicare Other | Admitting: Physician Assistant

## 2019-05-31 ENCOUNTER — Encounter: Payer: Self-pay | Admitting: Physician Assistant

## 2019-05-31 VITALS — BP 142/84 | HR 81 | Ht 66.0 in | Wt 232.0 lb

## 2019-05-31 DIAGNOSIS — G4733 Obstructive sleep apnea (adult) (pediatric): Secondary | ICD-10-CM

## 2019-05-31 DIAGNOSIS — E119 Type 2 diabetes mellitus without complications: Secondary | ICD-10-CM | POA: Diagnosis not present

## 2019-05-31 DIAGNOSIS — I480 Paroxysmal atrial fibrillation: Secondary | ICD-10-CM | POA: Diagnosis not present

## 2019-05-31 DIAGNOSIS — I10 Essential (primary) hypertension: Secondary | ICD-10-CM

## 2019-05-31 DIAGNOSIS — I214 Non-ST elevation (NSTEMI) myocardial infarction: Secondary | ICD-10-CM | POA: Diagnosis not present

## 2019-05-31 NOTE — Patient Instructions (Addendum)
Medication Instructions:   HOLD Hyzaar the morning of procedure   TAKE Metoprolol Tartrate (Lopressor) 100 mg 2 hours prior to procedure  *If you need a refill on your cardiac medications before your next appointment, please call your pharmacy*   Lab Work: NONE ordered at this time of appointment   If you have labs (blood work) drawn today and your tests are completely normal, you will receive your results only by: Marland Kitchen MyChart Message (if you have MyChart) OR . A paper copy in the mail If you have any lab test that is abnormal or we need to change your treatment, we will call you to review the results.   Testing/Procedures: NONE ordered at this time of appointment   Follow-Up: At [facility], you and your health needs are our priority.  As part of our continuing mission to provide you with exceptional heart care, we have created designated Provider Care Teams.  These Care Teams include your primary Cardiologist (physician) and Advanced Practice Providers (APPs -  Physician Assistants and Nurse Practitioners) who all work together to provide you with the care you need, when you need it.  We recommend signing up for the patient portal called "MyChart".  Sign up information is provided on this After Visit Summary.  MyChart is used to connect with patients for Virtual Visits (Telemedicine).  Patients are able to view lab/test results, encounter notes, upcoming appointments, etc.  Non-urgent messages can be sent to your provider as well.   To learn more about what you can do with MyChart, go to [email or web address].    Your next appointment:   3 month(s)  The format for your next appointment:   In Person  Provider:   Sanda Klein, MD  Other Instructions    Your cardiac CT will be scheduled at one of the below locations:   [facility] [address] [place], [place] [address] [phone]  [place] [address] [place], [place] [address] [phone]  If scheduled at [facility], please arrive at the [facility] main entrance of [facility] 30 minutes prior to test start time. Proceed to the [facility] Radiology Department (first floor) to check-in and test prep.  If scheduled at [facility], please arrive 15 mins early for check-in and test prep.  Please follow these instructions carefully (unless otherwise directed):    On the Night Before the Test: . Be sure to Drink plenty of water. . Do not consume any caffeinated/decaffeinated beverages or chocolate 12 hours prior to your test. . Do not take any antihistamines 12 hours prior to your test. . If you take Metformin do not take 24 hours prior to test. . If the patient has contrast allergy: ? Patient will need a prescription for Prednisone and very clear instructions (as follows): 1. Prednisone 50 mg - take 13 hours prior to test 2. Take another Prednisone 50 mg 7 hours prior to test 3. Take another Prednisone 50 mg 1 hour prior to test 4. Take Benadryl 50 mg 1 hour prior to test . Patient must complete all four doses of above prophylactic medications. . Patient will need a ride after test due to Benadryl.  On the Day of the Test: . Drink plenty of water. Do not drink any water within one hour of the test. . Do not eat any food 4 hours prior to the test. . You may take your regular medications  prior to the test.  . Take metoprolol (Lopressor) two hours prior to test. . HOLD Furosemide/Hydrochlorothiazide morning of the test. . FEMALES- please wear underwire-free bra if available   *For Clinical Staff only. Please instruct patient the following:*        -Drink plenty of water       -Hold Furosemide/hydrochlorothiazide morning of the test       -Take metoprolol (Lopressor) 2 hours prior to test (if applicable).                  -If HR is less than 55 BPM- No Lopressor                 -IF HR is greater than 55 BPM and patient is less than or equal to 31 yrs old Lopressor 100mg  x1.                -If HR is greater than 55 BPM and patient is greater than 60 yrs old Lopressor 50 mg x1.     Do not give Lopressor to patients with an allergy to lopressor or anyone with asthma or active COPD symptoms (currently taking steroids).       After the Test: . Drink plenty of water. . After receiving IV contrast, you may experience a mild flushed feeling. This is normal. . On occasion, you may experience a mild rash up to 24 hours after the test. This is not dangerous. If this occurs, you can take Benadryl 25 mg and increase your fluid intake. . If you experience trouble breathing, this can be serious. If it is severe call 911 IMMEDIATELY. If it is mild, please call our office. . If you take any of these medications: Glipizide/Metformin, Avandament, Glucavance, please do not take 48 hours after completing test unless otherwise instructed.   Once we have confirmed authorization from your insurance company, we will call you to set up a date and time for your test.   For non-scheduling related questions, please contact the cardiac imaging nurse navigator should you have any questions/concerns: Marchia Bond, RN Navigator Cardiac Imaging [facility] [phone] office  For scheduling needs, including cancellations and rescheduling, please call [phone].

## 2019-05-31 NOTE — Progress Notes (Signed)
Cardiology Office Note:    Date:  06/02/2019   ID:  Rebecca Malone, DOB 1965/06/28, MRN [id number]  PCP:  System, Pcp Not In  Cardiologist:  Rebecca Klein, MD  Electrophysiologist:  None   Referring MD: No ref. provider found   Chief Complaint  Patient presents with  . Follow-up    seen for Dr. Sallyanne Malone    History of Present Illness:    Rebecca Malone is a 54 y.o. female with a hx of GERD, HTN, OSA, DM 2, polysubstance abuse (tobacco and cocaine) and newly diagnosed PAF.  She had a normal nuclear stress test in March 2014.  Patient underwent sleep study in 2016 that was positive for obstructive sleep apnea and was placed on CPAP machine.  She was never able to complete CPAP titration.  Previous 48-hour Holter monitor obtained on 08/18/2017 showed baseline sinus rhythm with very rare PVCs and PACs and single 3 beat episode of atrial tachycardia, otherwise no significant bradycardic event or irregular rhythm.  Echocardiogram obtained on 09/13/2017 showed EF 55 to 60%, grade 1 DD, moderate TR, PA peak pressure 41 mmHg.  Patient was seen by Rebecca Ransom, PA-C on 09/02/2017, she did have improvement in her palpitation after placed on diltiazem however continued to have almost daily episodes of palpitation.  A longer 30-day event monitor was obtained on 09/13/2017, that showed sinus rhythm, rare PVCs, no evidence of atrial fibrillation or significant bradycardia.  Although there were several manually triggered event, all of those recorded sinus rhythm.  There was no correlation between her subjective complaints and underlying rhythm.   I last saw her in August 2020, her blood pressure was quite high at the time.  For some reason, she was also on both losartan-hydrochlorothiazide and lisinopril-hydrochlorothiazide at the time.  She is also on both bisoprolol and metoprolol.  I discontinued both metoprolol and the lisinopril-hydrochlorothiazide.  I also increase her bisoprolol to 10 mg 3 times daily and  added amlodipine 5 mg daily.  I recommended follow-up lab work and she returned in 2 weeks to see our hypertension clinic, however this never happened.  More recently, patient presented to the hospital with shortness of breath, chest pain and atrial fibrillation.  High-sensitivity troponin was elevated however remained flat around 200 range.  She self converted on rate control therapy.  There was also some concern for ST elevation on the telemetry.  She was started on Eliquis and Lopressor 50 mg twice daily.  During the hospitalization, apparently patient was still taking both bisoprolol and metoprolol at home despite the last year's medication change.  This was eventually changed to metoprolol upon discharge.  Patient was also instructed to refrain from cocaine use while on the beta-blocker.  Echocardiogram performed on 05/25/2019 showed EF 60 to 65%, grade 2 DD, elevated LVEDP, no significant valve issue.  It was recommended for the patient to have outpatient coronary CT at some point.  Patient presents today for cardiology office visit.  She denies any chest pain or palpitation.  She has not had any recurrent A. fib since being discharged from the hospital.  Otherwise she is compliant with metoprolol however has not used any bisoprolol as instructed by cardiology service.  She says she went to a party and it did not realize what she was eating at the time, she suspect that when she took in some cocaine otherwise she is adamant that she does not use cocaine regularly.  She is aware of contraindication of beta-blocker associated with  cocaine.  She will need a basic metabolic panel prior to coronary CT.  In the morning of the coronary CT, I instructed her to hold the Hyzaar so she can double up on her metoprolol to slow down her heart rate enough for the procedure.  I also instructed her to avoid Metformin for 24 hours before and 48-hour after coronary CT, however she told me she has not taken any Metformin  recently due to GI issue.  She says every time she take the Metformin, she has a significant bouts of diarrhea that last for the entire day.  She asked me to refer her to Dr. Katheran Malone of [facility] who is on her preferred insurance provider list for endocrinologist so her diabetes can be better controlled.  Past Medical History:  Diagnosis Date  . Asthma   . Degenerative arthritis    "knees" (03/07/2015)  . GERD (gastroesophageal reflux disease)   . History of blood transfusion 10/2009   "related to losing too much blood w/my period"  . Hypertension   . Obesity   . OSA on CPAP   . Pneumonia 2008  . Tachycardia march 2014   admitted to hospital  . Type II diabetes mellitus [facility])     Past Surgical History:  Procedure Laterality Date  . CESAREAN SECTION  1999    Current Medications: Current Meds  Medication Sig  . albuterol (PROVENTIL) (2.5 MG/3ML) 0.083% nebulizer solution Take 3 mLs (2.5 mg total) by nebulization every 6 (six) hours as needed for wheezing or shortness of breath. 1 time refill for patient send to PCP  . amLODipine (NORVASC) 5 MG tablet TAKE 1 TABLET(5 MG) BY MOUTH DAILY (Patient taking differently: Take 5 mg by mouth daily. )  . apixaban (ELIQUIS) 5 MG TABS tablet Take 1 tablet (5 mg total) by mouth 2 (two) times daily.  Marland Kitchen aspirin EC 81 MG EC tablet Take 1 tablet (81 mg total) by mouth daily.  . budesonide-formoterol (SYMBICORT) 80-4.5 MCG/ACT inhaler Take 2 puffs first thing in am and then another 2 puffs about 12 hours later.  . diclofenac sodium (VOLTAREN) 1 % GEL Apply 4 g topically 4 (four) times daily as needed (knee pain).   . famotidine (PEPCID) 20 MG tablet TAKE 1 TABLET(20 MG) BY MOUTH AT BEDTIME (Patient taking differently: Take 20 mg by mouth at bedtime. )  . HYDROcodone-acetaminophen (NORCO/VICODIN) 5-325 MG tablet Take 1 tablet by mouth every 6 (six) hours as needed for pain.  Marland Kitchen losartan-hydrochlorothiazide (HYZAAR) 50-12.5 MG tablet Take 1 tablet by  mouth daily.  . metoprolol tartrate (LOPRESSOR) 50 MG tablet Take 1 tablet (50 mg total) by mouth 2 (two) times daily.  . naproxen sodium (ALEVE) 220 MG tablet Take 440 mg by mouth daily as needed (pain).  . pantoprazole (PROTONIX) 40 MG tablet Take 1 tablet (40 mg total) by mouth daily. Take 30-60 min before first meal of the day  . rosuvastatin (CRESTOR) 20 MG tablet Take 1 tablet (20 mg total) by mouth daily.     Allergies:   Advair diskus [fluticasone-salmeterol], Other, and Pork-derived products   Social History   Socioeconomic History  . Marital status: Single    Spouse name: Not on file  . Number of children: Not on file  . Years of education: Not on file  . Highest education level: Not on file  Occupational History  . Not on file  Tobacco Use  . Smoking status: Current Every Day Smoker    Packs/day: 0.35  Years: 20.00    Pack years: 7.00    Types: Cigarettes  . Smokeless tobacco: Never Used  Substance and Sexual Activity  . Alcohol use: Yes    Comment: socially  . Drug use: No  . Sexual activity: Not Currently    Birth control/protection: None  Other Topics Concern  . Not on file  Social History Narrative  . Not on file   Social Determinants of Health   Financial Resource Strain:   . Difficulty of Paying Living Expenses:   Food Insecurity:   . Worried About Charity fundraiser in the Last Year:   . Arboriculturist in the Last Year:   Transportation Needs:   . Film/video editor (Medical):   Marland Kitchen Lack of Transportation (Non-Medical):   Physical Activity:   . Days of Exercise per Week:   . Minutes of Exercise per Session:   Stress:   . Feeling of Stress :   Social Connections:   . Frequency of Communication with Friends and Family:   . Frequency of Social Gatherings with Friends and Family:   . Attends Religious Services:   . Active Member of Clubs or Organizations:   . Attends Archivist Meetings:   Marland Kitchen Marital Status:      Family  History: The patient's family history includes Diabetes in her brother and sister; Heart attack in her father.  ROS:   Please see the history of present illness.     All other systems reviewed and are negative.  EKGs/Labs/Other Studies Reviewed:    The following studies were reviewed today:  Echo 05/25/2019 1. Mild intracavitary gradient noted. Peak velocity 1.66 m/s. Peak  gradient 11 mmHg.Marland Kitchen Left ventricular ejection fraction, by estimation, is  60 to 65%. The left ventricle has normal function. The left ventricle has  no regional wall motion abnormalities.  There is mild concentric left ventricular hypertrophy. Left ventricular  diastolic parameters are consistent with Grade II diastolic dysfunction  (pseudonormalization). Elevated left ventricular end-diastolic pressure.  2. Right ventricular systolic function is normal. The right ventricular  size is normal. There is normal pulmonary artery systolic pressure.  3. The mitral valve is normal in structure. Mild mitral valve  regurgitation. No evidence of mitral stenosis.  4. The aortic valve is tricuspid. Aortic valve regurgitation is not  visualized. No aortic stenosis is present.  5. The inferior vena cava is normal in size with greater than 50%  respiratory variability, suggesting right atrial pressure of 3 mmHg.   EKG:  EKG is ordered today.  The ekg ordered today demonstrates normal sinus rhythm with T wave inversion in the inferior leads.  Recent Labs: 05/24/2019: B Natriuretic Peptide 182.5; Magnesium 2.0; TSH 1.966 05/25/2019: ALT 18; BUN 16; Creatinine, Ser 0.85; Hemoglobin 12.2; Platelets 226; Potassium 4.0; Sodium 138  Recent Lipid Panel    Component Value Date/Time   CHOL 172 05/24/2019 2241   CHOL 175 09/12/2018 0946   TRIG 108 05/24/2019 2241   HDL 41 05/24/2019 2241   HDL 53 09/12/2018 0946   CHOLHDL 4.2 05/24/2019 2241   VLDL 22 05/24/2019 2241   LDLCALC 109 (H) 05/24/2019 2241   LDLCALC 106 (H)  09/12/2018 0946    Physical Exam:    VS:  BP (!) 142/84   Pulse 81   Ht 5\' 6"  (1.676 m)   Wt 232 lb (105.2 kg)   LMP 09/30/2012   SpO2 94%   BMI 37.45 kg/m     Wt  Readings from Last 3 Encounters:  05/31/19 232 lb (105.2 kg)  05/26/19 233 lb 8 oz (105.9 kg)  09/12/18 231 lb 12.8 oz (105.1 kg)     GEN:  Well nourished, well developed in no acute distress HEENT: Normal NECK: No JVD; No carotid bruits LYMPHATICS: No lymphadenopathy CARDIAC: RRR, no murmurs, rubs, gallops RESPIRATORY:  Clear to auscultation without rales, wheezing or rhonchi  ABDOMEN: Soft, non-tender, non-distended MUSCULOSKELETAL:  No edema; No deformity  SKIN: Warm and dry NEUROLOGIC:  Alert and oriented x 3 PSYCHIATRIC:  Normal affect   ASSESSMENT:    1. PAF (paroxysmal atrial fibrillation) ([place])   2. Non-ST elevation (NSTEMI) myocardial infarction ([place])   3. Type 2 diabetes mellitus without complication, without long-term current use of insulin ([place])   4. Essential hypertension   5. OSA (obstructive sleep apnea)    PLAN:    In order of problems listed above:  1. PAF: No recurrent palpitation since discharge.  On Eliquis and metoprolol  2. NSTEMI: Likely demand ischemia in the setting of paroxysmal atrial fibrillation.  I recommended she proceed with coronary CT.  3. Hypertension: Blood pressure stable  4. DM2: Patient requested referral to [facility] endocrinology who is on her preferred list by her insurance.  5. Obstructive sleep apnea: She was never fitted for CPAP therapy.  This likely contributed to uncontrolled blood pressure.   Medication Adjustments/Labs and Tests Ordered: Current medicines are reviewed at length with the patient today.  Concerns regarding medicines are outlined above.  Orders Placed This Encounter  Procedures  . Ambulatory referral to Endocrinology  . EKG 12-Lead   No orders of the defined types were placed in this encounter.   Patient Instructions  Medication  Instructions:   HOLD Hyzaar the morning of procedure   TAKE Metoprolol Tartrate (Lopressor) 100 mg 2 hours prior to procedure  *If you need a refill on your cardiac medications before your next appointment, please call your pharmacy*   Lab Work: NONE ordered at this time of appointment   If you have labs (blood work) drawn today and your tests are completely normal, you will receive your results only by: Marland Kitchen MyChart Message (if you have MyChart) OR . A paper copy in the mail If you have any lab test that is abnormal or we need to change your treatment, we will call you to review the results.   Testing/Procedures: NONE ordered at this time of appointment   Follow-Up: At [facility], you and your health needs are our priority.  As part of our continuing mission to provide you with exceptional heart care, we have created designated Provider Care Teams.  These Care Teams include your primary Cardiologist (physician) and Advanced Practice Providers (APPs -  Physician Assistants and Nurse Practitioners) who all work together to provide you with the care you need, when you need it.  We recommend signing up for the patient portal called "MyChart".  Sign up information is provided on this After Visit Summary.  MyChart is used to connect with patients for Virtual Visits (Telemedicine).  Patients are able to view lab/test results, encounter notes, upcoming appointments, etc.  Non-urgent messages can be sent to your provider as well.   To learn more about what you can do with MyChart, go to [email or web address].    Your next appointment:   3 month(s)  The format for your next appointment:   In Person  Provider:   Sanda Klein, MD  Other Instructions    Your cardiac  CT will be scheduled at one of the below locations:   [facility] [address] [place], [place] [address] [phone]  [facility] [address] [place], [place] [address] [phone]  If scheduled at [facility], please arrive at the [facility] main entrance of [facility] 30 minutes prior to test start time. Proceed to the [facility] Radiology Department (first floor) to check-in and test prep.  If scheduled at [facility], please arrive 15 mins early for check-in and test prep.  Please follow these instructions carefully (unless otherwise directed):    On the Night Before the Test: . Be sure to Drink plenty of water. . Do not consume any caffeinated/decaffeinated beverages or chocolate 12 hours prior to your test. . Do not take any antihistamines 12 hours prior to your test. . If you take Metformin do not take 24 hours prior to test. . If the patient has contrast allergy: ? Patient will need a prescription for Prednisone and very clear instructions (as follows): 1. Prednisone 50 mg - take 13 hours prior to test 2. Take another Prednisone 50 mg 7 hours prior to test 3. Take another Prednisone 50 mg 1 hour prior to test 4. Take Benadryl 50 mg 1 hour prior to test . Patient must complete all four doses of above prophylactic medications. . Patient will need a ride after test due to Benadryl.  On the Day of the Test: . Drink plenty of water. Do not drink any water within one hour of the test. . Do not eat any food 4 hours prior to the test. . You may take your regular medications prior to the test.  . Take metoprolol (Lopressor) two hours prior to test. . HOLD Furosemide/Hydrochlorothiazide morning of the test. . FEMALES- please wear underwire-free bra if available   *For Clinical Staff only. Please instruct patient the following:*        -Drink plenty of water       -Hold Furosemide/hydrochlorothiazide morning of the test       -Take metoprolol (Lopressor) 2 hours prior to test (if applicable).                  -If HR is less than 55 BPM- No Lopressor                 -IF HR is greater than 55 BPM and patient is less than or equal to 82 yrs old Lopressor 100mg  x1.                -If HR is greater than 55 BPM and patient is greater than 71 yrs old Lopressor 50 mg x1.     Do not give Lopressor to patients with an allergy to lopressor or anyone with asthma or active COPD symptoms (currently taking steroids).       After the Test: . Drink plenty of water. . After receiving IV contrast, you may experience a mild flushed feeling. This is normal. . On occasion, you may experience a mild rash up to 24 hours after the test. This is not dangerous. If this occurs, you can take Benadryl 25 mg and increase your fluid intake. . If you experience trouble breathing, this can be serious. If it is severe call 911 IMMEDIATELY. If it is mild, please call our office. . If you take any of these medications: Glipizide/Metformin, Avandament, Glucavance, please do not take  48 hours after completing test unless otherwise instructed.   Once we have confirmed authorization from your insurance company, we will call you to set up a date and time for your test.   For non-scheduling related questions, please contact the cardiac imaging nurse navigator should you have any questions/concerns: Marchia Bond, RN Navigator Cardiac Imaging [facility] [phone] office  For scheduling needs, including cancellations and rescheduling, please call [phone].       Hilbert Corrigan, [place]  06/02/2019 11:01 PM    [facility]

## 2019-06-02 ENCOUNTER — Telehealth (HOSPITAL_COMMUNITY): Payer: Self-pay | Admitting: *Deleted

## 2019-06-02 ENCOUNTER — Encounter: Payer: Self-pay | Admitting: Physician Assistant

## 2019-06-02 NOTE — Telephone Encounter (Signed)
Attempted to call patient regarding upcoming cardiac CT appointment. Left message on voicemail with name and callback number Musab Wingard Tai RN Navigator Cardiac Imaging [facility] [phone] Office [phone] Cell 

## 2019-06-05 ENCOUNTER — Ambulatory Visit (HOSPITAL_COMMUNITY): Admission: RE | Admit: 2019-06-05 | Payer: Medicare Other | Source: Ambulatory Visit

## 2019-06-07 ENCOUNTER — Telehealth (HOSPITAL_COMMUNITY): Payer: Self-pay | Admitting: *Deleted

## 2019-06-07 NOTE — Telephone Encounter (Signed)
Attempted to call pt concerning her cardiac CT scan on Jun 08, 2019.  Pt did not answer and was unable to leave a message.

## 2019-06-07 NOTE — Telephone Encounter (Signed)
Reaching out to patient to offer assistance regarding upcoming cardiac imaging study; pt verbalizes understanding of appt date/time, parking situation and where to check in, pre-test NPO status and medications ordered, and verified current allergies; name and call back number provided for further questions should they arise  Lafawn Lenoir Tai RN Navigator Cardiac Imaging [facility] [phone] office [phone] cell 

## 2019-06-08 ENCOUNTER — Other Ambulatory Visit: Payer: Self-pay

## 2019-06-08 ENCOUNTER — Ambulatory Visit (HOSPITAL_COMMUNITY)
Admission: RE | Admit: 2019-06-08 | Discharge: 2019-06-08 | Disposition: A | Discharge status: Home / Self Care | Payer: Medicare Other | Source: Ambulatory Visit | Attending: Physician Assistant | Admitting: Physician Assistant

## 2019-06-08 DIAGNOSIS — R072 Precordial pain: Secondary | ICD-10-CM | POA: Diagnosis not present

## 2019-06-08 DIAGNOSIS — I214 Non-ST elevation (NSTEMI) myocardial infarction: Secondary | ICD-10-CM | POA: Insufficient documentation

## 2019-06-08 MED ORDER — IOHEXOL 350 MG/ML SOLN
80.0000 mL | Freq: Once | INTRAVENOUS | Status: AC | PRN
  Administered 2019-06-08: 80 mL via INTRAVENOUS

## 2019-06-08 MED ORDER — NITROGLYCERIN 0.4 MG SL SUBL
0.8000 mg | SUBLINGUAL_TABLET | Freq: Once | SUBLINGUAL | Status: AC
  Administered 2019-06-08: 0.8 mg via SUBLINGUAL

## 2019-06-08 MED ORDER — NITROGLYCERIN 0.4 MG SL SUBL
SUBLINGUAL_TABLET | SUBLINGUAL | Status: AC
  Filled 2019-06-08: qty 2

## 2019-06-14 ENCOUNTER — Telehealth: Payer: Self-pay | Admitting: Pharmacist

## 2019-06-14 NOTE — Telephone Encounter (Signed)
Patient called on phone, concerned that Eliquis is causing headaches.  PMH of hypertension, CHF, DM, and Afib. Has history of cocaine use.  Is not testing BP or BG at home.  Went over all medications with patient using patient's pill bottles.  Reports she tries to take her Eliquis first thing in the morning on an empty stomach.  Advised patient that Eliquis does not typically cause headaches and it is possible that hyperglycemia or elevated BP could be playing a role.  Recommended patient begin testing her BG and BP regularly and to keep appointments with her providers.  Patient will report back if headaches do not go away.

## 2019-06-21 ENCOUNTER — Encounter: Payer: Self-pay | Admitting: *Deleted

## 2019-06-21 ENCOUNTER — Ambulatory Visit: Payer: Medicare Other | Admitting: Physician Assistant

## 2019-07-02 ENCOUNTER — Other Ambulatory Visit: Payer: Self-pay | Admitting: Physician Assistant

## 2019-07-18 ENCOUNTER — Other Ambulatory Visit: Payer: Self-pay | Admitting: Physician Assistant

## 2019-07-20 ENCOUNTER — Ambulatory Visit: Payer: Medicare Other | Admitting: Internal Medicine

## 2019-08-10 ENCOUNTER — Telehealth: Payer: Self-pay | Admitting: *Deleted

## 2019-08-10 NOTE — Telephone Encounter (Signed)
Patient was referred by [facility] to Dr. Hartford Poli in [place], [place] for her diabetes---office states she had an appointment  on 07/20/19 and was a No Show.  The office has been unable to contact patient to reschedule.

## 2019-08-31 ENCOUNTER — Ambulatory Visit: Payer: Medicare Other | Admitting: Cardiovascular Disease

## 2019-10-15 ENCOUNTER — Other Ambulatory Visit: Payer: Self-pay | Admitting: Physician Assistant

## 2020-02-28 ENCOUNTER — Telehealth: Payer: Self-pay | Admitting: Internal Medicine

## 2020-02-28 MED ORDER — ALBUTEROL SULFATE HFA 108 (90 BASE) MCG/ACT IN AERS
2.0000 | INHALATION_SPRAY | Freq: Four times a day (QID) | RESPIRATORY_TRACT | 0 refills | Status: DC | PRN
Start: 2020-02-28 — End: 2020-02-29

## 2020-02-28 NOTE — Telephone Encounter (Signed)
Ok albuterol x one but put her on tomorrow pm in one of the blocked spots and ask her to bring all active meds with her to avoid need to go to ER  Block her spot on 03/01/20 once moved to 2/3

## 2020-02-28 NOTE — Telephone Encounter (Signed)
Spoke with the pt  She is asking for refill on her proventil inhaler  She has not been seen since 09/03/2017  She scheduled an appt with Dr Melvyn Novas for 03/01/20  She has been having increased SOB, esp at night for the past several months  She is still on symbicort, and has been using her neb machine with albuterol until machine stopped working a few days ago  Please advise if we could refill her albuterol inhaler until ov  Thanks!

## 2020-02-28 NOTE — Telephone Encounter (Signed)
Spoke with the pt  Notified of response per MW  Made appt with MW for tomorrow at 03/01/20- advised to bring all meds in hand and NOT just a med list  Rx for albuterol inhaler has been sent to pharm

## 2020-02-29 ENCOUNTER — Encounter: Payer: Self-pay | Admitting: Internal Medicine

## 2020-02-29 ENCOUNTER — Other Ambulatory Visit: Payer: Self-pay

## 2020-02-29 ENCOUNTER — Ambulatory Visit (INDEPENDENT_AMBULATORY_CARE_PROVIDER_SITE_OTHER): Payer: Medicare Other | Admitting: Internal Medicine

## 2020-02-29 DIAGNOSIS — J45991 Cough variant asthma: Secondary | ICD-10-CM

## 2020-02-29 DIAGNOSIS — I10 Essential (primary) hypertension: Secondary | ICD-10-CM

## 2020-02-29 MED ORDER — FAMOTIDINE 20 MG PO TABS: ORAL_TABLET | ORAL | Status: DC

## 2020-02-29 MED ORDER — TRAMADOL HCL 50 MG PO TABS: 50.0000 mg | ORAL_TABLET | ORAL | 2 refills | Status: AC | PRN

## 2020-02-29 NOTE — Progress Notes (Signed)
Rebecca Malone, female    DOB: 1965-12-16, 55 y.o.   MRN: [id number]    Brief patient profile:  82 yobf active smoker with lifelong asthma worse since 05/2017 attributed to lack of ac and marked increase use of saba since then so self referred to pulmonary clinic 09/03/2017  with nl spirometry p saba    09/03/2017  1st pulmonary eval/ Rebecca Malone  Chief Complaint  Patient presents with  . Pulmonary Consult    Self referral. Pt states dxed with Asthma as a child. She has occ wheezing and increased SOB since May 2019. She is using her albuterol inhaler 8-9 x per day on average.   variable sob x years with daily need for saba and not clear she's been on effective maint rx  Though thinks symbicort may have helped in past much worse x 3 m of no a/c in bedroom though assoc dry cough/ hoarsness. rec Depomerol 120 mg IM Stop lisinopril and start losartan 50- 12.5 mg daily  Stop lopressor and start bisoprolol 5 mg twice daily  Plan A = Automatic = symbiocort 80 Take 2 puffs first thing in am and then another 2 puffs about 12 hours later.  Work on inhaler technique:    Plan B = Backup Only use your albuterol as a rescue medication Plan C = Crisis - only use your albuterol nebulizer if you first try Plan B and it fails to help > ok to use the nebulizer up to every 4 hours but if start needing it regularly call for immediate appointment Pantoprazole (protonix) 40 mg   Take  30-60 min before first meal of the day and Pepcid (famotidine)  20 mg one @  bedtime until return to office - this is the best way to tell whether stomach acid is contributing to your problem.   GERD diet  The key is to stop smoking completely before smoking completely stops you!  Please schedule a follow up office visit in  1 week, call sooner if needed with all medications /inhalers/ solutions in hand so we can verify exactly what you are taking. This includes all medications from all doctors and over the [place] separate them  into two bags:  the ones you take automatically, no matter what, vs the ones you take just when you feel you need them "BAG #2 is UP TO YOU"  - this will really help [place] help you take your medications more effectively.     02/29/2020  Extended f/u ov/Rebecca Malone re: AB/ still smoking  Bad control > 1 year  Back on ACEi  Chief Complaint  Patient presents with  . Follow-up    Patient states that she has to sleep sitting up because of cough and trouble breathing, has dry hacky coughing and clearing throat. States that she is on 5 different inhalers.   Dyspnea: sitting still unless coughing Cough: takes her breath away/ urinary incont/ non productive  Sleeping: straight up  SABA use: way too much 02: prn    No obvious day to day or daytime variability or assoc excess/ purulent sputum or mucus plugs or hemoptysis or cp or chest tightness, subjective wheeze or overt sinus or hb symptoms.    Also denies any obvious fluctuation of symptoms with weather or environmental changes or other aggravating or alleviating factors except as outlined above   No unusual exposure hx or h/o childhood pna/ asthma or knowledge of premature birth.  Current Allergies, Complete Past Medical History, Past  Surgical History, Family History, and Social History were reviewed in [employer] record.   ROS  The following are not active complaints unless bolded Hoarseness, sore throat, dysphagia, dental problems, itching, sneezing,  nasal congestion or discharge of excess mucus or purulent secretions, ear ache,   fever, chills, sweats, unintended wt loss or wt gain, classically pleuritic or exertional cp,  orthopnea pnd or arm/hand swelling  or leg swelling, presyncope, palpitations, abdominal pain, anorexia, nausea, vomiting, diarrhea  or change in bowel habits or change in bladder habits, change in stools or change in urine, dysuria, hematuria,  rash, arthralgias, visual complaints, headache, numbness, weakness  or ataxia or problems with walking or coordination,  change in mood or  memory.        Current Meds  Medication Sig  . albuterol (PROVENTIL) (2.5 MG/3ML) 0.083% nebulizer solution Take 3 mLs (2.5 mg total) by nebulization every 6 (six) hours as needed for wheezing or shortness of breath. 1 time refill for patient send to PCP  . albuterol (VENTOLIN HFA) 108 (90 Base) MCG/ACT inhaler Inhale 2 puffs into the lungs every 6 (six) hours as needed for wheezing or shortness of breath.  Marland Kitchen amLODipine (NORVASC) 5 MG tablet TAKE 1 TABLET(5 MG) BY MOUTH DAILY (Patient taking differently: Take 5 mg by mouth daily.)  . apixaban (ELIQUIS) 5 MG TABS tablet Take 1 tablet (5 mg total) by mouth 2 (two) times daily.  Marland Kitchen aspirin EC 81 MG EC tablet Take 1 tablet (81 mg total) by mouth daily.  . budesonide-formoterol (SYMBICORT) 80-4.5 MCG/ACT inhaler Take 2 puffs first thing in am and then another 2 puffs about 12 hours later.  . diclofenac sodium (VOLTAREN) 1 % GEL Apply 4 g topically 4 (four) times daily as needed (knee pain).   . famotidine (PEPCID) 20 MG tablet TAKE 1 TABLET(20 MG) BY MOUTH AT BEDTIME (Patient taking differently: Take 20 mg by mouth at bedtime.)  . HYDROcodone-acetaminophen (NORCO/VICODIN) 5-325 MG tablet Take 1 tablet by mouth every 6 (six) hours as needed for pain.  Marland Kitchen losartan-hydrochlorothiazide (HYZAAR) 50-12.5 MG tablet Take 1 tablet by mouth daily.  . metFORMIN (GLUCOPHAGE) 1000 MG tablet Take 1 tablet (1,000 mg total) by mouth 2 (two) times daily with a meal.  . metoprolol tartrate (LOPRESSOR) 50 MG tablet Take 1 tablet (50 mg total) by mouth 2 (two) times daily.  . naproxen sodium (ALEVE) 220 MG tablet Take 440 mg by mouth daily as needed (pain).  . pantoprazole (PROTONIX) 40 MG tablet Take 1 tablet (40 mg total) by mouth daily. Take 30-60 min before first meal of the day  . rosuvastatin (CRESTOR) 20 MG tablet Take 1 tablet (20 mg total) by mouth daily.           Past Medical History:   Diagnosis Date  . Asthma   . Chest pain march 2014   admitted to hospital   . Degenerative arthritis    "knees" (03/07/2015)  . GERD (gastroesophageal reflux disease)   . History of blood transfusion 10/2009   "related to losing too much blood w/my period"  . Hypertension   . MI (myocardial infarction) [facility]) March 2014  . Obesity   . OSA on CPAP   . Pneumonia 2008  . Tachycardia march 2014   admitted to hospital  . Type II diabetes mellitus ([place])            Objective:       Wt Readings from Last 3 Encounters:  02/29/20  234 lb (106.1 kg)  05/31/19 232 lb (105.2 kg)  05/26/19 233 lb 8 oz (105.9 kg)      Vital signs reviewed  02/29/2020  - Note at rest 02 sats  97% on RA   General appearance:   amb bf nad    Obese bf with striking pseudowheeze   HEENT : pt wearing mask not removed for exam due to covid -19 concerns.    NECK :  without JVD/Nodes/TM/ nl carotid upstrokes bilaterally   LUNGS: no acc muscle use,  Nl contour chest which is clear to A and P bilaterally without cough on insp or exp maneuvers   CV:  RRR  no s3 or murmur or increase in P2, and no edema   ABD:  Obese soft and nontender with nl inspiratory excursion in the supine position. No bruits or organomegaly appreciated, bowel sounds nl  MS:  Nl gait/ ext warm without deformities, calf tenderness, cyanosis or clubbing No obvious joint restrictions   SKIN: warm and dry without lesions    NEURO:  alert, approp, nl sensorium with  no motor or cerebellar deficits apparent.      I personally reviewed images and agree with radiology impression as follows:  CT lung cuts on CT coronary 06/08/19 No acute or significant extracardiac abnormality.     Assessment

## 2020-02-29 NOTE — Assessment & Plan Note (Signed)
Spirometry 09/03/2017  FEV1 1.90 (80%)  Ratio 81 p saba with active "wheeze"  insp > exp on ACEi and high dose lopressor > both d/c'd   - 09/03/2017  After extensive coaching inhaler device  effectiveness =    50% at best > try symb 80 2bid  - 02/29/2020 d/c ACEi and rx as cyclical cough 08/26/2749 >>>   DDX of  difficult airways management almost all start with A and  include Adherence, Ace Inhibitors, Acid Reflux, Active Sinus Disease, Alpha 1 Antitripsin deficiency, Anxiety masquerading as Airways dz,  ABPA,  Allergy(esp in young), Aspiration (esp in elderly), Adverse effects of meds,  Active smoking or vaping, A bunch of PE's (a small clot burden can't cause this syndrome unless there is already severe underlying pulm or vascular dz with poor reserve) plus two Bs  = Bronchiectasis and Beta blocker use..and one C= CHF   Adherence is always the initial "prime suspect" and is a multilayered concern that requires a "trust but verify" approach in every patient - starting with knowing how to use medications, especially inhalers, correctly, keeping up with refills and understanding the fundamental difference between maintenance and prns vs those medications only taken for a very short course and then stopped and not refilled.  - very poor insight into meds - return with all meds in hand using a trust but verify approach to confirm accurate Medication  Reconciliation The principal here is that until we are certain that the  patients are doing what we've asked, it makes no sense to ask them to do more.  ACEi adverse effects at the  top of the usual list of suspects and the only way to rule it out is a trial off > see a/p    ? Acid (or non-acid) GERD > always difficult to exclude as up to 75% of pts in some series report no assoc GI/ Heartburn symptoms> rec continue max (24h)  acid suppression and diet restrictions/ reviewed    - Of the three most common causes of  Sub-acute / recurrent or chronic cough, only one  (GERD)  can actually contribute to/ trigger  the other two (asthma and post nasal drip syndrome)  and perpetuate the cylce of cough.  While not intuitively obvious, many patients with chronic low grade reflux do not cough until there is a primary insult that disturbs the protective epithelial barrier and exposes sensitive nerve endings.   This is typically viral but can due to PNDS and  either may apply here.   The point is that once this occurs, it is difficult to eliminate the cycle  using anything but a maximally effective acid suppression regimen at least in the short run, accompanied by an appropriate diet to address non acid GERD and control / eliminate the cough itself for at least 3 days with tramadol >>> also so added 6 day taper off  Prednisone starting at 40 mg per day in case of component of Th-2 driven upper or lower airways inflammation (if cough responds short term only to relapse before return while will on full rx for uacs (as above), then  that would point to allergic rhinitis/ asthma or eos bronchitis as alternative dx).  Active smoker see prior   ? Allergy/ asthma > cover for now with pred and just use saba neb prn as the other hfa's may aggravate the UACS component  F/u in 2 weeks         Each maintenance medication was reviewed  in detail including emphasizing most importantly the difference between maintenance and prns and under what circumstances the prns are to be triggered using an action plan format where appropriate.  Total time for H and P, chart review, counseling, reviewing  and generating customized AVS unique to this office visit / same day charting  > 30 min

## 2020-02-29 NOTE — Assessment & Plan Note (Signed)
Trial off acei 09/03/2017 due to pseudowheeze Trial off high dose lopressor 09/03/2017 due to ? Asthma  - d/c ACE again 02/29/2020 due to due refractory pseudowheeze   In the best review of chronic cough to date ( NEJM 2016 375 [id number]) ,  ACEi are now felt to cause cough in up to  20% of pts which is a 4 fold increase from previous reports and does not include the variety of non-specific complaints we see in pulmonary clinic in pts on ACEi but previously attributed to another dx like  Copd/asthma and  include PNDS, throat and chest congestion, "bronchitis", unexplained dyspnea and noct "strangling" sensations, and hoarseness, but also  atypical /refractory GERD symptoms like dysphagia and "bad heartburn"   The only way I know  to prove this is not an "ACEi Case" is a trial off ACEi x a minimum of 6 weeks then regroup.

## 2020-02-29 NOTE — Patient Instructions (Addendum)
We referring your for a nebulizer machine - ok to use portabilize and use just the nebulized albuterol up to every 4 hours if needed for breathing   Prednisone 10 mg take  4 each am x 2 days,   2 each am x 2 days,  1 each am x 2 days and stop   Stop lisinopril and start losartan twice daily   Take delsym two tsp every 12 hours and supplement if needed with  tramadol 50 mg up to 2 every 4 hours to suppress the urge to cough. Swallowing water and/or using ice chips/non mint and menthol containing candies (such as lifesavers or sugarless jolly ranchers) are also effective.  You should rest your voice and avoid activities that you know make you cough.  Once you have eliminated the cough for 3 straight days try reducing the tramadol first,  then the delsym as tolerated.    GERD (REFLUX)  is an extremely common cause of respiratory symptoms just like yours , many times with no obvious heartburn at all.    It can be treated with medication, but also with lifestyle changes including elevation of the head of your bed (ideally with 6 -8inch blocks under the headboard of your bed),  Smoking cessation, avoidance of late meals, excessive alcohol, and avoid fatty foods, chocolate, peppermint, colas, red wine, and acidic juices such as orange juice.  NO MINT OR MENTHOL PRODUCTS SO NO COUGH DROPS  USE SUGARLESS CANDY INSTEAD (Jolley ranchers or Stover's or Life Savers) or even ice chips will also do - the key is to swallow to prevent all throat clearing. NO OIL BASED VITAMINS - use powdered substitutes.  Avoid fish oil when coughing.   The key is to stop smoking completely before smoking completely stops you!      Please schedule a follow up office visit in 2 weeks, sooner if needed  with all medications /inhalers/ solutions in hand so we can verify exactly what you are taking. This includes all medications from all doctors and over the counters

## 2020-03-01 ENCOUNTER — Ambulatory Visit: Payer: Medicare Other | Admitting: Internal Medicine

## 2020-04-03 DIAGNOSIS — L02213 Cutaneous abscess of chest wall: Secondary | ICD-10-CM | POA: Diagnosis not present

## 2020-07-01 ENCOUNTER — Other Ambulatory Visit: Payer: Self-pay | Admitting: Physician Assistant

## 2020-07-02 ENCOUNTER — Other Ambulatory Visit: Payer: Self-pay | Admitting: *Deleted

## 2020-07-02 MED ORDER — FAMOTIDINE 20 MG PO TABS: 20.0000 mg | ORAL_TABLET | Freq: Every day | ORAL | 0 refills | Status: DC

## 2020-07-16 DIAGNOSIS — W010XXA Fall on same level from slipping, tripping and stumbling without subsequent striking against object, initial encounter: Secondary | ICD-10-CM | POA: Diagnosis not present

## 2020-07-16 DIAGNOSIS — S43401A Unspecified sprain of right shoulder joint, initial encounter: Secondary | ICD-10-CM | POA: Diagnosis not present

## 2020-07-18 DIAGNOSIS — M25511 Pain in right shoulder: Secondary | ICD-10-CM | POA: Diagnosis not present

## 2020-08-06 DIAGNOSIS — M25511 Pain in right shoulder: Secondary | ICD-10-CM | POA: Insufficient documentation

## 2020-09-11 ENCOUNTER — Telehealth: Payer: Self-pay | Admitting: Cardiovascular Disease

## 2020-09-11 ENCOUNTER — Telehealth: Payer: Self-pay

## 2020-09-11 DIAGNOSIS — Z9189 Other specified personal risk factors, not elsewhere classified: Secondary | ICD-10-CM

## 2020-09-11 NOTE — Progress Notes (Addendum)
[facility])                                            [place] Team                                        Statin Quality Measure Assessment    09/11/2020  Rebecca Malone 12/04/1965 [id number]  Per review of chart and payor information, this patient has been flagged for non-adherence to the following CMS Quality Measure:   '[x]'$  Statin Use in Persons with Diabetes  '[x]'$  Statin Use in Persons with Cardiovascular Disease - history of two myocardial infarctions   The ASCVD Risk score Rebecca Bussing DC Jr., Rebecca al., Rebecca Malone) failed to calculate for the following reasons:   The patient has a prior MI or stroke diagnosis LDL 109 mg/dL (05/24/2019)  Currently prescribed statin:  '[x]'$  Yes '[]'$  No     Comments: [name] - prescribed April 2021. I called and spoke with this patient and it seems she has never taken rosuvastatin, per our discussion. LDL > 70 mg/dL goal. She was last seen by cardiology May 2021. I confirmed the patient does not have a PCP; she reports that she currently have  providers who manages her primary medical issues and "cut the middle man out by not having a PCP". Of note, her A1c was 8.0% (05/24/2019) last year and patient does not follow an endocrinologist. I pointed out to the patient my concern re: her diabetes and not being followed by a PCP and the need for a PCP. This patient stated that she is able to manage her diabetes and knows when she needs help. She reports that her BG and BP are always high. She has not been seen by cardiology since last year, I offered  to call the clinic to help facilitate scheduling an appointment and she declined stating that she will call to make an appointment herself. I informed the patient that I will f/u in ~ a month to see if she scheduled an appointment with cardiology.   Atorvastatin prescription is expired. I repeatedly offered to help her schedule an appointment and  encouraged her to have a PCP. I educated the patient on the importance of cholesterol lowering medication with regard to uncontrolled diabetes and cardiovascular history.   I will reach out to the last cardiology provider to see if the clinic could contact the patient for an appointment.   Thank you for your time,  Kristeen Miss, [place] Cell: [phone]

## 2020-09-11 NOTE — Telephone Encounter (Signed)
Returned call to patient of Dr. Loletha Grayer - she reports bad palpitations a few nights ago, stating it felt like her "heart was trying to come out of her body". She reports 8-9/10 on scale -- palpitations today were 1-3/10 on scale  She is requesting a 30 day monitor - has not been seen since 05/2019  She reports she had a heart attack last year, and they discussed shocking her heart after - h/o PAF -- hospitalization in April 2021 for AFib w/RVR  She reports palpitations can occur with no known trigger   She has been scheduled for appointment with MD tomorrow @ 11:40am. She was advised to bring medications to appointment to be reviewed

## 2020-09-11 NOTE — Telephone Encounter (Signed)
Patient c/o Palpitations:  High priority if patient c/o lightheadedness, shortness of breath, or chest pain  How long have you had palpitations/irregular HR/ Afib? Are you having the symptoms now? About a few days patient is having them now  Are you currently experiencing lightheadedness, SOB or CP? Not right now  Do you have a history of afib (atrial fibrillation) or irregular heart rhythm? After heart attack   Have you checked your BP or HR? (document readings if available): HR 84   Are you experiencing any other symptoms? If she get up she get diazines.

## 2020-09-12 ENCOUNTER — Other Ambulatory Visit: Payer: Self-pay

## 2020-09-12 ENCOUNTER — Ambulatory Visit (INDEPENDENT_AMBULATORY_CARE_PROVIDER_SITE_OTHER): Payer: Medicare Other | Admitting: Cardiovascular Disease

## 2020-09-12 ENCOUNTER — Encounter: Payer: Self-pay | Admitting: Cardiovascular Disease

## 2020-09-12 VITALS — BP 180/80 | HR 73 | Resp 20 | Ht 65.0 in | Wt 233.4 lb

## 2020-09-12 DIAGNOSIS — I10 Essential (primary) hypertension: Secondary | ICD-10-CM

## 2020-09-12 DIAGNOSIS — G4733 Obstructive sleep apnea (adult) (pediatric): Secondary | ICD-10-CM

## 2020-09-12 DIAGNOSIS — E119 Type 2 diabetes mellitus without complications: Secondary | ICD-10-CM | POA: Diagnosis not present

## 2020-09-12 DIAGNOSIS — I480 Paroxysmal atrial fibrillation: Secondary | ICD-10-CM | POA: Diagnosis not present

## 2020-09-12 NOTE — Progress Notes (Signed)
Cardiology Office Note:    Date:  09/19/2020   ID:  Rebecca Malone, DOB 1965/11/29, MRN [id number]  PCP:  Pcp, No   [facility] Providers Cardiologist:  Sanda Klein, MD     Referring MD: No ref. provider found   Chief Complaint  Patient presents with   Palpitations    History of Present Illness:    Rebecca Malone is a 55 y.o. female with a hx of paroxysmal atrial fibrillation, severe obesity, OSA, type 2 diabetes mellitus, hypertension and recurrent problems with rapid palpitations.  These have been quiescent for a while but have recently bother her again.  She was just lying in bed when she had abrupt onset of very rapid palpitations and recorded her heart rate as being 180 bpm using a smart phone.  She believes the rhythm was regular.  She was unable to get an electrical tracing with her device.  The symptoms of abruptly terminated by themselves.  The associated anxiety and dyspnea, but not angina.  She has not had lower extremity edema and has not experienced syncope or any focal neurological events.  An extended event monitor performed in August 2019 did not show any arrhythmia.  She was diagnosed with atrial fibrillation during a hospitalization in April 2021.  Her urine drug screen was positive for cocaine.  Anticoagulation with Eliquis was initiated at that time.  Several recordings triggered by the patient for palpitation showed normal rhythm.  Her echocardiogram in April 2021 showed LVH with hyperdynamic left ventricular systolic function and an intracavitary "gradient" and grade 2 diastolic dysfunction.  She had a normal nuclear stress test in 2014.  She has some confusion surrounding her medications.  She has prescription bottles for both metoprolol succinate 100 mg once daily and metoprolol tartrate 50 mg twice daily.  I am not sure whether she was taking either 1.  She has a bottle for amlodipine 5 mg daily but is not taking it.  She has prescriptions for both  losartan HCTZ 50/12.5 as well as lisinopril HCTZ and is apparently taking them both.  She is currently not using her albuterol inhaler and is not requiring Vicodin.  She takes meloxicam and takes tramadol only as needed.  She is compliant with rosuvastatin.  She denies cocaine use.  Past Medical History:  Diagnosis Date   Asthma    Degenerative arthritis    "knees" (03/07/2015)   GERD (gastroesophageal reflux disease)    History of blood transfusion 10/2009   "related to losing too much blood w/my period"   Hypertension    Obesity    OSA on CPAP    Pneumonia 2008   Tachycardia march 2014   admitted to hospital   Type II diabetes mellitus ([place])     Past Surgical History:  Procedure Laterality Date   CESAREAN SECTION  1999    Current Medications: Current Meds  Medication Sig   albuterol (PROVENTIL) (2.5 MG/3ML) 0.083% nebulizer solution Take 3 mLs (2.5 mg total) by nebulization every 6 (six) hours as needed for wheezing or shortness of breath. 1 time refill for patient send to PCP   diclofenac sodium (VOLTAREN) 1 % GEL Apply 4 g topically 4 (four) times daily as needed (knee pain).    famotidine (PEPCID) 20 MG tablet Take 1 tablet (20 mg total) by mouth at bedtime. NEED OV.   losartan-hydrochlorothiazide (HYZAAR) 50-12.5 MG tablet Take 1 tablet by mouth daily.   meloxicam (MOBIC) 15 MG tablet Take 15 mg by mouth  daily as needed for pain.   metoprolol succinate (TOPROL-XL) 100 MG 24 hr tablet Take 100 mg by mouth daily. Take with or immediately following a meal.   metoprolol tartrate (LOPRESSOR) 50 MG tablet Take 50 mg by mouth daily as needed.   pantoprazole (PROTONIX) 40 MG tablet Take 1 tablet (40 mg total) by mouth daily. Take 30-60 min before first meal of the day   traMADol (ULTRAM) 50 MG tablet Take by mouth every 6 (six) hours as needed.   [DISCONTINUED] amLODipine (NORVASC) 5 MG tablet TAKE 1 TABLET(5 MG) BY MOUTH DAILY (Patient taking differently: Take 5 mg by mouth daily.)    [DISCONTINUED] aspirin EC 81 MG EC tablet Take 1 tablet (81 mg total) by mouth daily.   [DISCONTINUED] HYDROcodone-acetaminophen (NORCO/VICODIN) 5-325 MG tablet Take 1 tablet by mouth every 6 (six) hours as needed for pain.   [DISCONTINUED] meloxicam (MOBIC) 15 MG tablet Take 15 mg by mouth daily.   [DISCONTINUED] naproxen sodium (ALEVE) 220 MG tablet Take 440 mg by mouth daily as needed (pain).     Allergies:   Advair diskus [fluticasone-salmeterol], Other, and Pork-derived products   Social History   Socioeconomic History   Marital status: Single    Spouse name: Not on file   Number of children: Not on file   Years of education: Not on file   Highest education level: Not on file  Occupational History   Not on file  Tobacco Use   Smoking status: Every Day    Packs/day: 0.35    Years: 20.00    Pack years: 7.00    Types: Cigarettes   Smokeless tobacco: Never   Tobacco comments:    1 pack last her a week  Vaping Use   Vaping Use: Never used  Substance and Sexual Activity   Alcohol use: Yes    Comment: socially   Drug use: No   Sexual activity: Not Currently    Birth control/protection: None  Other Topics Concern   Not on file  Social History Narrative   Not on file   Social Determinants of Health   Financial Resource Strain: Not on file  Food Insecurity: Not on file  Transportation Needs: Not on file  Physical Activity: Not on file  Stress: Not on file  Social Connections: Not on file     Family History: The patient's family history includes Diabetes in her brother and sister; Heart attack in her father.  ROS:   Please see the history of present illness.     All other systems reviewed and are negative.  EKGs/Labs/Other Studies Reviewed:    The following studies were reviewed today: Event monitor 09/13/2017 Baseline rhythm is sinus with normal circadian rhythm variation. Rare PVCs are seen as well as a single ventricular couplet. There is no complex  ventricular arrhythmia. No symptoms occurred during this time. There is no evidence of atrial fibrillation. There is no evidence of significant pauses or severe bradycardia. The patient submitted several episodes of symptom driven recording (for "dizziness", "flutters", "shortness of breath", "chest pain". All of these recordings showed sinus rhythm. The data is of good quality, with 29 days and 5 hours of recording during a 30-day event monitor.   Normal event monitor. There is no correlation between the patient's subjective complaints and the rhythm during that time.   Echocardiogram 05/25/2019   1. Mild intracavitary gradient noted. Peak velocity 1.66 m/s. Peak  gradient 11 mmHg.Marland Kitchen Left ventricular ejection fraction, by estimation, is  60  to 65%. The left ventricle has normal function. The left ventricle has  no regional wall motion abnormalities.  There is mild concentric left ventricular hypertrophy. Left ventricular  diastolic parameters are consistent with Grade II diastolic dysfunction  (pseudonormalization). Elevated left ventricular end-diastolic pressure.   2. Right ventricular systolic function is normal. The right ventricular  size is normal. There is normal pulmonary artery systolic pressure.   3. The mitral valve is normal in structure. Mild mitral valve  regurgitation. No evidence of mitral stenosis.   4. The aortic valve is tricuspid. Aortic valve regurgitation is not  visualized. No aortic stenosis is present.   5. The inferior vena cava is normal in size with greater than 50%  respiratory variability, suggesting right atrial pressure of 3 mmHg.   EKG:  EKG is  ordered today.  The ekg ordered today demonstrates normal sinus rhythm with left ventricular hypertrophy and T wave inversions in leads I, II, III, aVL and V6, likely secondary to LVH  Recent Labs: No results found for requested labs within last 8760 hours.  05/25/2019 creatinine 0.85, potassium 4.0, hemoglobin  12.2, hemoglobin A1c 8%, TSH 1.966 Recent Lipid Panel    Component Value Date/Time   CHOL 172 05/24/2019 2241   CHOL 175 09/12/2018 0946   TRIG 108 05/24/2019 2241   HDL 41 05/24/2019 2241   HDL 53 09/12/2018 0946   CHOLHDL 4.2 05/24/2019 2241   VLDL 22 05/24/2019 2241   LDLCALC 109 (H) 05/24/2019 2241   LDLCALC 106 (H) 09/12/2018 0946    Risk Assessment/Calculations:     CHA2DS2-VASc Score = 3  The patient's score is based upon: CHF History: No HTN History: Yes Diabetes History: Yes Stroke History: No Vascular Disease History: No Age Score: 0 Gender Score: 1     ASSESSMENT AND PLAN: Paroxysmal Atrial Fibrillation (ICD10:  I48.0) The patient's CHA2DS2-VASc score is 3, indicating a 3.2% annual risk of stroke.    Secondary Hypercoagulable State (ICD10:  D68.69) The patient is at significant risk for stroke/thromboembolism based upon her CHA2DS2-VASc Score of 3.  Continue Apixaban (Eliquis).    Signed,  Sanda Klein, MD    09/19/2020 6:47 PM         Physical Exam:    VS:  BP (!) 180/80   Pulse 73   Resp 20   Ht '5\' 5"'$  (1.651 m)   Wt 233 lb 6.4 oz (105.9 kg)   LMP 09/30/2012   SpO2 97%   BMI 38.84 kg/m     Wt Readings from Last 3 Encounters:  09/12/20 233 lb 6.4 oz (105.9 kg)  02/29/20 234 lb (106.1 kg)  05/31/19 232 lb (105.2 kg)     GEN: Severely obese, well nourished, well developed in no acute distress HEENT: Normal NECK: No JVD; No carotid bruits LYMPHATICS: No lymphadenopathy CARDIAC: RRR, no murmurs, rubs, gallops RESPIRATORY:  Clear to auscultation without rales, wheezing or rhonchi  ABDOMEN: Soft, non-tender, non-distended MUSCULOSKELETAL:  No edema; No deformity  SKIN: Warm and dry NEUROLOGIC:  Alert and oriented x 3 PSYCHIATRIC:  Normal affect   ASSESSMENT:    1. PAF (paroxysmal atrial fibrillation) ([place])   2. Essential hypertension   3. OSA (obstructive sleep apnea)   4. Severe obesity (BMI 35.0-39.9) with comorbidity ([place])   5.  Type 2 diabetes mellitus without complication, without long-term current use of insulin (H[facility])    PLAN:    In order of problems listed above:  Afib: Her palpitations probably represent atrial arrhythmia such  as atrial tachycardia or atrial fibrillation.  The abrupt onset and abrupt termination and the remarkably fast rate for her age at 180 bpm make it unlikely that they represent just sinus tachycardia.  Symptoms are not frequent or severe enough to justify antiarrhythmics.  Her left atrium was not dilated and she has not had previous stroke or TIA. Encouraged her to purchase a commercially available device such as a smart watch or a Kardiamobile recorder.  Recommended that she take metoprolol succinate 100 mg daily and have the 50 mg tablets of metoprolol tartrate available "as needed" for breakthrough palpitations.  CHA2DS2-VASc score is 3 (gender, diabetes, hypertension).  She has been compliant with apixaban. HTN: Poorly controlled.  A lot of confusion surrounding her medications.  We will discontinue the lisinopril HCT and continue losartan HCT.  She should take metoprolol succinate 100 mg every day and take metoprolol tartrate as needed only.  Amlodipine will be removed from her list.  Asked her to take meloxicam sparingly since it would raise her blood pressure and promote salt and water retention.  Asked her to send me some blood pressure readings through MyChart in a couple of weeks. OSA: Discussed the relationship between sleep apnea arrhythmia and heart failure.  Needs to restart treatment with CPAP. Obesity: Strongly encouraged weight loss which will have multiple beneficial effects including reduction in arrhythmia risk, better control of diabetes and hypertension, prevention of heart failure. DM: No recent hemoglobin A1c, but was not adequately controlled a year ago.  Do not have a recent lipid profile either, but LDL was only slightly above target at 109.        Medication  Adjustments/Labs and Tests Ordered: Current medicines are reviewed at length with the patient today.  Concerns regarding medicines are outlined above.  Orders Placed This Encounter  Procedures   EKG 12-Lead   No orders of the defined types were placed in this encounter.   Patient Instructions  Medication Instructions:  STOP the Aspirin STOP the Lisinopril-HCTZ STOP the Amlodipine  TAKE the Meloxicam only as needed *If you need a refill on your cardiac medications before your next appointment, please call your pharmacy*   Lab Work: None ordered If you have labs (blood work) drawn today and your tests are completely normal, you will receive your results only by: [place] (if you have MyChart) OR A paper copy in the mail If you have any lab test that is abnormal or we need to change your treatment, we will call you to review the results.   Testing/Procedures: None ordered   Follow-Up: At [facility], you and your health needs are our priority.  As part of our continuing mission to provide you with exceptional heart care, we have created designated Provider Care Teams.  These Care Teams include your primary Cardiologist (physician) and Advanced Practice Providers (APPs -  Physician Assistants and Nurse Practitioners) who all work together to provide you with the care you need, when you need it.  We recommend signing up for the patient portal called "MyChart".  Sign up information is provided on this After Visit Summary.  MyChart is used to connect with patients for Virtual Visits (Telemedicine).  Patients are able to view lab/test results, encounter notes, upcoming appointments, etc.  Non-urgent messages can be sent to your provider as well.   To learn more about what you can do with MyChart, go to [email or web address].    Your next appointment:   Follow up in 3 months with Isaac Laud  Meng, PA Follow up in 6 months with Dr. Sallyanne Kuster   Signed, Sanda Klein, MD  09/19/2020  6:47 PM    [place]

## 2020-09-12 NOTE — Patient Instructions (Signed)
Medication Instructions:  STOP the Aspirin STOP the Lisinopril-HCTZ STOP the Amlodipine  TAKE the Meloxicam only as needed *If you need a refill on your cardiac medications before your next appointment, please call your pharmacy*   Lab Work: None ordered If you have labs (blood work) drawn today and your tests are completely normal, you will receive your results only by: [place] (if you have MyChart) OR A paper copy in the mail If you have any lab test that is abnormal or we need to change your treatment, we will call you to review the results.   Testing/Procedures: None ordered   Follow-Up: At [facility], you and your health needs are our priority.  As part of our continuing mission to provide you with exceptional heart care, we have created designated Provider Care Teams.  These Care Teams include your primary Cardiologist (physician) and Advanced Practice Providers (APPs -  Physician Assistants and Nurse Practitioners) who all work together to provide you with the care you need, when you need it.  We recommend signing up for the patient portal called "MyChart".  Sign up information is provided on this After Visit Summary.  MyChart is used to connect with patients for Virtual Visits (Telemedicine).  Patients are able to view lab/test results, encounter notes, upcoming appointments, etc.  Non-urgent messages can be sent to your provider as well.   To learn more about what you can do with MyChart, go to [email or web address].    Your next appointment:   Follow up in 3 months with Almyra Deforest, PA Follow up in 6 months with Dr. Sallyanne Kuster

## 2020-09-19 ENCOUNTER — Encounter: Payer: Self-pay | Admitting: Cardiovascular Disease

## 2020-11-29 ENCOUNTER — Other Ambulatory Visit: Payer: Self-pay

## 2020-11-29 DIAGNOSIS — R0602 Shortness of breath: Secondary | ICD-10-CM | POA: Diagnosis present

## 2020-11-29 DIAGNOSIS — F17210 Nicotine dependence, cigarettes, uncomplicated: Secondary | ICD-10-CM | POA: Diagnosis not present

## 2020-11-29 DIAGNOSIS — T7840XA Allergy, unspecified, initial encounter: Secondary | ICD-10-CM | POA: Diagnosis not present

## 2020-11-29 DIAGNOSIS — I10 Essential (primary) hypertension: Secondary | ICD-10-CM | POA: Insufficient documentation

## 2020-11-29 DIAGNOSIS — Z79899 Other long term (current) drug therapy: Secondary | ICD-10-CM | POA: Diagnosis not present

## 2020-11-29 DIAGNOSIS — T383X5A Adverse effect of insulin and oral hypoglycemic [antidiabetic] drugs, initial encounter: Secondary | ICD-10-CM | POA: Diagnosis not present

## 2020-11-29 DIAGNOSIS — J45909 Unspecified asthma, uncomplicated: Secondary | ICD-10-CM | POA: Diagnosis not present

## 2020-11-29 DIAGNOSIS — Z7901 Long term (current) use of anticoagulants: Secondary | ICD-10-CM | POA: Insufficient documentation

## 2020-11-29 DIAGNOSIS — E1165 Type 2 diabetes mellitus with hyperglycemia: Secondary | ICD-10-CM | POA: Diagnosis not present

## 2020-11-29 DIAGNOSIS — J9801 Acute bronchospasm: Secondary | ICD-10-CM | POA: Diagnosis not present

## 2020-11-29 DIAGNOSIS — Z7984 Long term (current) use of oral hypoglycemic drugs: Secondary | ICD-10-CM | POA: Diagnosis not present

## 2020-11-29 DIAGNOSIS — T887XXA Unspecified adverse effect of drug or medicament, initial encounter: Secondary | ICD-10-CM | POA: Diagnosis not present

## 2020-11-29 NOTE — ED Triage Notes (Signed)
Pt to ED from home with c/o mid-sternal chest pain/SOB that began this morning and has been progressively getting worse. Pt also states that her CBG at home was 356 after she took her regular scheduled metformin.

## 2020-11-30 ENCOUNTER — Emergency Department (HOSPITAL_BASED_OUTPATIENT_CLINIC_OR_DEPARTMENT_OTHER): Payer: Medicare Other

## 2020-11-30 ENCOUNTER — Other Ambulatory Visit: Payer: Self-pay

## 2020-11-30 ENCOUNTER — Emergency Department (HOSPITAL_BASED_OUTPATIENT_CLINIC_OR_DEPARTMENT_OTHER)
Admission: EM | Admit: 2020-11-30 | Discharge: 2020-11-30 | Disposition: A | Discharge status: Home / Self Care | Payer: Medicare Other | Attending: Emergency Medicine | Admitting: Emergency Medicine

## 2020-11-30 DIAGNOSIS — R079 Chest pain, unspecified: Secondary | ICD-10-CM | POA: Diagnosis not present

## 2020-11-30 DIAGNOSIS — R739 Hyperglycemia, unspecified: Secondary | ICD-10-CM

## 2020-11-30 DIAGNOSIS — J9801 Acute bronchospasm: Secondary | ICD-10-CM

## 2020-11-30 DIAGNOSIS — T50905A Adverse effect of unspecified drugs, medicaments and biological substances, initial encounter: Secondary | ICD-10-CM

## 2020-11-30 LAB — CBC WITH DIFFERENTIAL/PLATELET
Abs Immature Granulocytes: 0 10*3/uL (ref 0.00–0.07)
Basophils Absolute: 0 10*3/uL (ref 0.0–0.1)
Basophils Relative: 0 %
Eosinophils Absolute: 0.2 10*3/uL (ref 0.0–0.5)
Eosinophils Relative: 4 %
HCT: 41.1 % (ref 36.0–46.0)
Hemoglobin: 12.9 g/dL (ref 12.0–15.0)
Immature Granulocytes: 0 %
Lymphocytes Relative: 31 %
Lymphs Abs: 1.8 10*3/uL (ref 0.7–4.0)
MCH: 26.7 pg (ref 26.0–34.0)
MCHC: 31.4 g/dL (ref 30.0–36.0)
MCV: 84.9 fL (ref 80.0–100.0)
Monocytes Absolute: 0.4 10*3/uL (ref 0.1–1.0)
Monocytes Relative: 6 %
Neutro Abs: 3.3 10*3/uL (ref 1.7–7.7)
Neutrophils Relative %: 59 %
Platelets: 222 10*3/uL (ref 150–400)
RBC: 4.84 MIL/uL (ref 3.87–5.11)
RDW: 13.3 % (ref 11.5–15.5)
WBC: 5.6 10*3/uL (ref 4.0–10.5)
nRBC: 0 % (ref 0.0–0.2)

## 2020-11-30 LAB — COMPREHENSIVE METABOLIC PANEL
ALT: 13 U/L (ref 0–44)
AST: 11 U/L — ABNORMAL LOW (ref 15–41)
Albumin: 3.9 g/dL (ref 3.5–5.0)
Alkaline Phosphatase: 85 U/L (ref 38–126)
Anion gap: 11 (ref 5–15)
BUN: 13 mg/dL (ref 6–20)
CO2: 22 mmol/L (ref 22–32)
Calcium: 9.5 mg/dL (ref 8.9–10.3)
Chloride: 103 mmol/L (ref 98–111)
Creatinine, Ser: 0.78 mg/dL (ref 0.44–1.00)
GFR, Estimated: >60 mL/min (ref >60)
Glucose, Bld: 342 mg/dL — ABNORMAL HIGH (ref 70–99)
Potassium: 4.4 mmol/L (ref 3.5–5.1)
Sodium: 136 mmol/L (ref 135–145)
Total Bilirubin: 0.3 mg/dL (ref 0.3–1.2)
Total Protein: 7.4 g/dL (ref 6.5–8.1)

## 2020-11-30 LAB — TROPONIN I (HIGH SENSITIVITY)
Troponin I (High Sensitivity): 3 ng/L (ref <18)
Troponin I (High Sensitivity): 4 ng/L (ref <18)

## 2020-11-30 LAB — CBG MONITORING, ED
Glucose-Capillary: 234 mg/dL — ABNORMAL HIGH (ref 70–99)
Glucose-Capillary: 325 mg/dL — ABNORMAL HIGH (ref 70–99)

## 2020-11-30 MED ORDER — ALBUTEROL SULFATE HFA 108 (90 BASE) MCG/ACT IN AERS
2.0000 | INHALATION_SPRAY | Freq: Once | RESPIRATORY_TRACT | Status: AC
  Administered 2020-11-30: 2 via RESPIRATORY_TRACT
  Filled 2020-11-30: qty 6.7

## 2020-11-30 MED ORDER — ALBUTEROL SULFATE (2.5 MG/3ML) 0.083% IN NEBU
2.5000 mg | INHALATION_SOLUTION | Freq: Once | RESPIRATORY_TRACT | Status: AC
  Administered 2020-11-30: 2.5 mg via RESPIRATORY_TRACT
  Filled 2020-11-30: qty 3

## 2020-11-30 MED ORDER — SODIUM CHLORIDE 0.9 % IV BOLUS
1000.0000 mL | Freq: Once | INTRAVENOUS | Status: AC
  Administered 2020-11-30: 1000 mL via INTRAVENOUS

## 2020-11-30 MED ORDER — INSULIN ASPART 100 UNIT/ML IJ SOLN
10.0000 [IU] | Freq: Once | INTRAMUSCULAR | Status: AC
  Administered 2020-11-30: 10 [IU] via SUBCUTANEOUS

## 2020-11-30 NOTE — ED Notes (Signed)
Pt educated on proper use of MDI w/spacer. Pt able to perform w/out difficulty. Pt able to teach back asthma education to RT and expresses understanding. Pt respiratory status is stable w/no distress noted at this time.

## 2020-11-30 NOTE — ED Notes (Signed)
Pt verbalizes understanding of discharge instructions. Opportunity for questioning and answers were provided. Armand removed by staff, pt discharged from ED to home. Educated to monitor blood glucose at home. Use inhaler as needed.

## 2020-11-30 NOTE — ED Provider Notes (Signed)
[facility] Provider Note: [name], MD, FACEP  CSN: [id number] MRN: [id number] ARRIVAL: 11/29/20 at [place]: [id number]   CHIEF COMPLAINT  Shortness of Breath and Chest Pain   HISTORY OF PRESENT ILLNESS  11/30/20 2:37 AM Rebecca Malone is a 55 y.o. female with a history of diabetes.  She noted her blood sugar to be elevated yesterday morning and she took metformin.  She has a known intolerance to metformin and it results frequently in nausea, vomiting and diarrhea.  This occurred yesterday morning after she took her metformin.  Following the vomiting she developed chest pain which she describes as pressure and stabbing, along with shortness of breath.  The pain was present in the mid chest.  The pain improved throughout the day but yesterday evening her breathing worsened as she was significantly dyspneic on arrival.  Prior to my evaluation she was given a neb treatment by respiratory therapy with significant improvement in her symptoms.  She states she feels 99% better than when she got here.   Past Medical History:  Diagnosis Date   Asthma    Degenerative arthritis    "knees" (03/07/2015)   GERD (gastroesophageal reflux disease)    History of blood transfusion 10/2009   "related to losing too much blood w/my period"   Hypertension    Obesity    OSA on CPAP    Pneumonia 2008   Tachycardia march 2014   admitted to hospital   Type II diabetes mellitus ([place])     Past Surgical History:  Procedure Laterality Date   CESAREAN SECTION  1999    Family History  Problem Relation Age of Onset   Heart attack Father    Diabetes Sister    Diabetes Brother     Social History   Tobacco Use   Smoking status: Every Day    Packs/day: 0.35    Years: 20.00    Pack years: 7.00    Types: Cigarettes   Smokeless tobacco: Never   Tobacco comments:    1 pack last her a week  Vaping Use   Vaping Use: Never used  Substance Use Topics   Alcohol use: Yes    Comment:  socially   Drug use: No    Prior to Admission medications   Medication Sig Start Date End Date Taking? Authorizing Provider  albuterol (PROVENTIL) (2.5 MG/3ML) 0.083% nebulizer solution Take 3 mLs (2.5 mg total) by nebulization every 6 (six) hours as needed for wheezing or shortness of breath. 1 time refill for patient send to PCP 09/12/18   Almyra Deforest, PA  apixaban (ELIQUIS) 5 MG TABS tablet Take 1 tablet (5 mg total) by mouth 2 (two) times daily. 05/26/19 08/24/19  [place] Eric J, DO  diclofenac sodium (VOLTAREN) 1 % GEL Apply 4 g topically 4 (four) times daily as needed (knee pain).     [provider]  famotidine (PEPCID) 20 MG tablet Take 1 tablet (20 mg total) by mouth at bedtime. NEED OV. 07/02/20   Croitoru, Mihai, MD  losartan-hydrochlorothiazide (HYZAAR) 50-12.5 MG tablet Take 1 tablet by mouth daily. 05/24/19   Almyra Deforest, PA  meloxicam (MOBIC) 15 MG tablet Take 15 mg by mouth daily as needed for pain.    [provider]  metFORMIN (GLUCOPHAGE) 1000 MG tablet Take 1 tablet (1,000 mg total) by mouth 2 (two) times daily with a meal. 09/12/18 05/24/19  Almyra Deforest, PA  metoprolol succinate (TOPROL-XL) 100 MG 24 hr tablet Take 100 mg  by mouth daily. Take with or immediately following a meal.    [provider]  metoprolol tartrate (LOPRESSOR) 50 MG tablet Take 50 mg by mouth daily as needed.    [provider]  pantoprazole (PROTONIX) 40 MG tablet Take 1 tablet (40 mg total) by mouth daily. Take 30-60 min before first meal of the day 05/24/19   Almyra Deforest, PA  rosuvastatin (CRESTOR) 20 MG tablet Take 1 tablet (20 mg total) by mouth daily. 05/26/19 08/24/19  [place] Donnamarie Poag, DO  traMADol (ULTRAM) 50 MG tablet Take by mouth every 6 (six) hours as needed.    [provider]    Allergies Advair diskus [fluticasone-salmeterol], Other, and Pork-derived products   REVIEW OF SYSTEMS  Negative except as noted here or in the History of Present  Illness.   PHYSICAL EXAMINATION  Initial Vital Signs Blood pressure (!) 183/87, pulse 72, temperature 98.4 F (36.9 C), temperature source Oral, resp. rate 20, height 5\' 5"  (1.651 m), weight 102.5 kg, last menstrual period 09/30/2012, SpO2 96 %.  Examination General: Well-developed, well-nourished female in no acute distress; appearance consistent with age of record HENT: normocephalic; atraumatic Eyes: pupils equal, round and reactive to light; extraocular muscles intact Neck: supple Heart: regular rate and rhythm Lungs: clear to auscultation bilaterally Abdomen: soft; nondistended; nontender; bowel sounds present Extremities: No deformity; full range of motion; pulses normal Neurologic: Awake, alert and oriented; motor function intact in all extremities and symmetric; no facial droop Skin: Warm and dry Psychiatric: Normal mood and affect   RESULTS  Summary of this visit's results, reviewed and interpreted by myself:   EKG Interpretation  Date/Time:    Ventricular Rate:    PR Interval:    QRS Duration:   QT Interval:    QTC Calculation:   R Axis:     Text Interpretation:         Laboratory Studies: Results for orders placed or performed during the hospital encounter of 11/30/20 (from the past 24 hour(s))  POC CBG, ED     Status: Abnormal   Collection Time: 11/30/20 12:12 AM  Result Value Ref Range   Glucose-Capillary 325 (H) 70 - 99 mg/dL  Troponin I (High Sensitivity)     Status: None   Collection Time: 11/30/20  3:25 AM  Result Value Ref Range   Troponin I (High Sensitivity) 4 <18 ng/L  CBG monitoring, ED     Status: Abnormal   Collection Time: 11/30/20  4:22 AM  Result Value Ref Range   Glucose-Capillary 234 (H) 70 - 99 mg/dL   CBC normal CMET normal except for glucose elevated at 342 First troponin 3 Second troponin 4  Imaging Studies: DG Chest 1 View  Result Date: 11/30/2020 CLINICAL DATA:  Chest pain. EXAM: CHEST  1 VIEW COMPARISON:  Chest  radiograph dated 05/24/2019. FINDINGS: Mild vascular congestion. No focal consolidation, pleural effusion, pneumothorax. Stable cardiac silhouette. No acute osseous pathology. IMPRESSION: Mild vascular congestion. No focal consolidation. Electronically Signed   By: Anner Crete M.D.   On: 11/30/2020 02:52    ED COURSE and MDM  Nursing notes, initial and subsequent vitals signs, including pulse oximetry, reviewed and interpreted by myself.  Vitals:   11/30/20 0253 11/30/20 0325 11/30/20 0333 11/30/20 0425  BP: (!) 187/82   (!) 182/87  Pulse: 70   75  Resp: 20 13 18 20   Temp:      TempSrc:      SpO2: 99%   99%  Weight:  Height:       Medications  albuterol (PROVENTIL) (2.5 MG/3ML) 0.083% nebulizer solution 2.5 mg (2.5 mg Nebulization Given 11/30/20 0252)  albuterol (VENTOLIN HFA) 108 (90 Base) MCG/ACT inhaler 2 puff (2 puffs Inhalation Given 11/30/20 0252)  sodium chloride 0.9 % bolus 1,000 mL (0 mLs Intravenous Stopped 11/30/20 0406)  insulin aspart (novoLOG) injection 10 Units (10 Units Subcutaneous Given 11/30/20 0318)   4:25 AM Sugar improved to 34 after IV fluids and insulin.  Her EKG is unchanged and her troponins are normal.  I suspect her chest pain is esophageal in nature associated with the vomiting she had yesterday.  She was advised to discontinue metformin and discuss other alternative any hypoglycemics with her physician.  She is feeling better and would like to go home at this time.  She was given an albuterol inhaler and AeroChamber and instructed in their use for treatment of her bronchospasm.   PROCEDURES  Procedures   ED DIAGNOSES     ICD-10-CM   1. Medication reaction, initial encounter  T50.905A     2. Hyperglycemia  R73.9     3. Bronchospasm  J98.01          Verdell Dykman, Jenny Reichmann, MD 11/30/20 [id number]

## 2020-12-17 NOTE — Progress Notes (Addendum)
[facility])                                            [place] Team                                        Statin Quality Measure Assessment    12/17/2020  Rebecca Malone 06-25-1965 [id number]  Per review of chart and payor information, this patient has been flagged for non-adherence to the following CMS Quality Measure:   [x]  Statin Use in Persons with Diabetes  [x]  Statin Use in Persons with Cardiovascular Disease - history of two myocardial infarctions   The ASCVD Risk score (Arnett DK, et al., 2019) failed to calculate for the following reasons:   The patient has a prior MI or stroke diagnosis LDL 109 mg/dL (05/24/2019)   Patient was previously on rosuvastatin 20 mg daily that was last prescribed April 2021. Per prior discussion with patient, she never took rosuvastatin. LDL was obtained last April  in 2021 and she is not followed by a PCP. I had a lengthy discussion with patient to establish care  with a PCP given h/o DM and and ASCVD, but patient declined. Currently, rosuvastatin prescription is expired.   If deemed therapeutically appropriate, please consider assessing need for rosuvastatin prescription renewal and updated lipid panel at the next O/v on 12/18/2020.  Please consider ONE of the following recommendations:   Initiate high intensity statin Atorvastatin 40mg  once daily, #90, 3 refills   Rosuvastatin 20mg  once daily, #90, 3 refills     Initiate moderate intensity          statin with reduced frequency if prior          statin intolerance 1x weekly, #13, 3 refills   2x weekly, #26, 3 refills   3x weekly, #39, 3 refills     Code for past statin intolerance or  other exclusions (required annually)   Provider Requirements: Associate code during an office visit or telehealth encounter  Drug Induced Myopathy G72.0   Myopathy, unspecified G72.9   Myositis, unspecified M60.9   Rhabdomyolysis  M62.82   Myalgia - SPC ONLY N23.5   Alcoholic fatty liver T73.2   Cirrhosis of liver K74.69   Prediabetes R73.03   Other abnormal blood glucose OR Elevated hemoglobin A1c R73.09   PCOS E28.2   Toxic liver disease, unspecified K71.9   Adverse effect of antihyperlipidemic and antiarteriosclerotic drugs, initial encounter - SUPD ONLY T46.6X5A    Thank you for your time,  Kristeen Miss, [place] Cell: [phone]

## 2020-12-18 ENCOUNTER — Ambulatory Visit: Payer: Medicare Other | Admitting: Cardiovascular Disease

## 2020-12-30 ENCOUNTER — Telehealth: Payer: Self-pay

## 2020-12-30 ENCOUNTER — Other Ambulatory Visit: Payer: Self-pay

## 2020-12-30 MED ORDER — FAMOTIDINE 20 MG PO TABS: 20.0000 mg | ORAL_TABLET | Freq: Every day | ORAL | 1 refills | Status: DC

## 2020-12-30 NOTE — Telephone Encounter (Signed)
Pepcid fax refill request received from [facility]. RX sent per request.

## 2021-01-30 ENCOUNTER — Telehealth: Payer: Self-pay

## 2021-01-30 DIAGNOSIS — Z79899 Other long term (current) drug therapy: Secondary | ICD-10-CM

## 2021-01-30 DIAGNOSIS — Z9189 Other specified personal risk factors, not elsewhere classified: Secondary | ICD-10-CM

## 2021-01-30 NOTE — Telephone Encounter (Signed)
Lmom for pharmd appt

## 2021-01-30 NOTE — Progress Notes (Addendum)
[facility])  [place] Team    01/30/2021  Rebecca Malone 1965-02-13 [id number]  Reason for referral: Medication Review  Referral source: Dr. Sallyanne Kuster Current insurance: [facility]  PMHx includes but not limited to:  Rebecca Malone is a 56 y.o. female with medical history significant of type 2 diabetes mellitus, history of MI 2014, essential hypertension, paroxysmal atrial fibrillation, GERD, asthma CHF (per problems list; last EF 65%).   Outreach:  Successful telephone call with patient.  HIPAA identifiers verified. This pharmacist called patient on 01/30/2021, but patient stated that today wasn't a good day to talk and ask that I call back next Monday.   Subjective:  [name] pharmacist received a referral for a medication review by Dr. Sallyanne Kuster as patient does not follow a PCP for chronic disease state management.   Current medications: Unable to review whether patient is taking medications as pharmacist was not able to discuss with patient today.    Drug - Indication Strength Directions Patient taking? (Y/N) - patient reported Patient taking differently? (Y/N) Patient comments Prescription Expired? (Y/N)  Famotidine  -GERD 20 mg  Take 1 tablet (20 mg) by mouth nightly. Per Dr. Brantley Stage, 09/29/2020 for 90 D/s.     N  Metoprolol XL -H/o MI, HTN, atrial fibrillation - BP 182/87 (as of 11/30/2020) 100 mg  Take 1 tablet (100 mg) by mouth daily. Per Dr. Brantley Stage, not filled in 2022/ to date.    Y  Metoprolol  -H/o MI, HTN, atrial fibrillation - BP 182/87 (as of 11/30/2020) 50 mg Take 1 tablet (50 mg) by mouth as needed. Per Dr. Brantley Stage, not filled in 2022/ to date.     Y  Meloxicam  -Pain  15 mg  Take 1 tablet (15 mg) by mouth as needed for pain. Per Dr. Brantley Stage, last filled 07/16/2020.    Unknown  Tramadol  -Pain 50 mg  Take by mouth every 6 hours as needed. Per Dr. Brantley Stage, last filled 03/28/2020.     Unknown  Apixaban  -Paroxysmal atrial fibrillation   -CHAD-Vasc score: 4 (HTN, DM, MI, female) 5 mg  Take 1 tablet (5 mg) by mouth two times daily. Per Dr. Brantley Stage, not filled in 2022/ to date.   06/14/19 telephone not indicated patient was concerned medication caused HA Y  Rosuvastatin  - Coronary artery calcium score 0; LDL 109 (as of 05/04/19) - H/o of MI 20 mg  Take 1 tablet (20 mg) by mouth daily. Per Dr. Brantley Stage, not filled in 2022/ to date.    Y  Losartan- HCTZ  -H/o MI, HTN - BP 182/87 (as of 11/30/2020) 50-12.5 mg  Take 1 tablet (50-12.5 mg) by mouth daily. Per Dr. Brantley Stage, not filled in 2022/ to date.    Y  Pantoprazole  -H/o GERD 40 mg  Take 1 tablet (40 mg) by mouth daily. Take 30-60 minutes before first meal of the day. Per Dr. Brantley Stage, not filled in 2022/ to date.    Y  Albuterol  -??Asthma exacerbation  2.5mg /4mL Take 3 mLs (2.5 mg total) by nebulization every 6 hours as needed for wheezing or shortness of breath. Per Dr. Brantley Stage, last filled 02/28/2020.    Y  Metformin  -H/o uncontrolled diabetes; last A1c 8% (05/24/2019) 1000 mg  Take 1 tablet (1000 mg) by mouth twice daily with food.     Y  Diclofenac -Pain 1% topical gel  Apply 4g four times daily as needed for Knee pain. Per Dr.  First, not filled in 2022/ to date.    Unknown      Objective: The ASCVD Risk score (Arnett DK, et al., 2019) failed to calculate for the following reasons:   The patient has a prior MI or stroke diagnosis  Lab Results  Component Value Date   CREATININE 0.78 11/29/2020   CREATININE 0.85 05/25/2019   CREATININE 1.03 (H) 05/24/2019    Lab Results  Component Value Date   HGBA1C 8.0 (H) 05/24/2019    Lipid Panel     Component Value Date/Time   CHOL 172 05/24/2019 2241   CHOL 175 09/12/2018 0946   TRIG 108 05/24/2019 2241   HDL 41 05/24/2019 2241   HDL 53 09/12/2018 0946   CHOLHDL 4.2 05/24/2019 2241   VLDL 22 05/24/2019 2241   LDLCALC 109 (H) 05/24/2019 2241   LDLCALC 106 (H) 09/12/2018 0946    BP Readings from Last 3 Encounters:  11/30/20 (!)  182/87  09/12/20 (!) 180/80  02/29/20 (!) 150/80    Allergies  Allergen Reactions   Advair Diskus [Fluticasone-Salmeterol] Other (See Comments)    Made sores on the back of her throat   Other Hives and Itching    Patient states she is allergic to all generic medications. Can only have BRAND NAMES MEDS ONLY   Pork-Derived Products     Religious    Assessment: All of her maintenance medication prescriptions are expired, if restarted may need to slowly up titrate medications particularly metformin (avoid GI side effects) and blood pressure medications. Metoprolol may decrease the efficacy of albuterol and metoprolol +metformin drug interaction may effect blood glucose/ mask symptoms of hypoglycemia. Otherwise, no major drug interactions noted that previously mentioned.    Medication Review Findings:  N/A (unable to discuss at this time)   Medication Adherence Findings:  Adherence Review  []  Excellent (no doses missed/week)     []  Good (no more than 1 dose missed/week)     []  Partial (2-3 doses missed/week) [x]  Poor (>3 doses missed/week)- prescriptions are expired   Patient with  unknown  understanding of regimen and  unknown  understanding of indications.   Plan: Will follow-up on Monday as per patient's request. Upon discussion with patient will follow up with Dr. Debby Bud with recommendations.  Will plan to address need for PCP again with patient as she only sees cardiologist and this pharmacist will not be able to follow her longitudinally.   Thank you for allowing pharmacy to be a part of this patients care. Kristeen Miss, PharmD Clinical Pharmacist [place] Cell: [phone]

## 2021-01-30 NOTE — Telephone Encounter (Signed)
----- Message from Leeroy Bock, RPH-CPP sent at 01/30/2021  1:06 PM EST ----- Regarding: FW: Statin prescription renewal See below messages - looks like Dr C wants pt to have complete med review due to confusion over what she's taking, can this be arranged at [facility] office where she's followed? ----- Message ----- From: Rowland Lathe, [facility] Sent: 01/30/2021  12:40 PM EST To: Leeroy Bock, RPH-CPP Subject: FW: Statin prescription renewal                Hi Megan,   You do not have to worry about refilling rosuvastatin (see reason below from provider), but I do appreciate the offer. I just didn't know if an embedded cards pharmacist may have a better working relationship with Dr. Sallyanne Kuster and may be able to assist longitudinally with this patient. I also, did not want to cause confusion in the clinic by working with this patient. However, I did realize yesterday that she is VERY resistant to healthcare personnel's and that perhaps I might should reach out to her.   Thanks,   Kristeen Miss  ----- Message ----- From: Sanda Klein, MD Sent: 01/10/2021  10:41 AM EST To: Rowland Lathe, RPH Subject: RE: Statin prescription renewal                She does not have coronary disease or other atherosclerotic disease, but ideally should be on a statin for diabetes mellitus with target LDL 100 (her most recent LDL was 109). I chose not to add one more medicine for her to remember to take, since there was so much confusion about her diabetes and blood pressure medicines (which I think are more urgent issue). I'd be more than happy to answer any questions you have about her medications, if you could do thus the great great service of clarifying exactly what she is and is not taking.  Thank you! ----- Message ----- From: Rowland Lathe, [facility] Sent: 01/10/2021   8:56 AM EST To: Sanda Klein, MD Subject: RE: Statin prescription renewal                Hi Dr. Sallyanne Kuster,  To clarify, is the patient  supposed to be on a statin?? I am a pharmacist that is in the clinic, but would still be more than happy to review her medications. However, she does not have a PCP and if there is a medication issue that arises would I reach out to you? ----- Message ----- From: Sanda Klein, MD Sent: 01/09/2021  11:28 AM EST To: Rowland Lathe, RPH Subject: RE: Statin prescription renewal                The diagnosis of ASCVD is inaccurate. She had a recent coronary CT Angio without disease and coronary calcium score of zero. On the other hand... She has a tremendous amount of confusion regarding the meds that she should be one. She would benefit from a full pharmacy review, maybe refer for pill pack if possible. Thanks ----- Message ----- From: Rowland Lathe, [facility] Sent: 01/08/2021  12:47 PM EST To: Sanda Klein, MD Subject: Statin prescription renewal                    Hello,  I am a [facility] clinical pharmacist that reviews patients for statin quality initiatives.    Per review of chart and payor information, patient has a diagnosis of ASCVD and diabetes but is not currently filling a statin  prescription. Rosuvastatin 20 mg prescription expired April 2022 and patient has not filled medication since it was prescribed. This places patient into the SUPD (Statin Use in Patients with Diabetes) & SPC (Statin use for Patients with Cardiovascular Disease) measure for CMS. Last LDL 109 mg/dL (as of 04/2019) and patient reports a history of two heart attacks within the past year. If deemed therapeutically appropriate, please consider renewing statin prescription. Currently, no appointment scheduled with cardiology and patient does not have a PCP.   Thank you for allowing pharmacy to be a part of this patient's care.   Kristeen Miss, PharmD  Clinical Pharmacist  [place]  Cell: [phone]

## 2021-02-03 NOTE — Telephone Encounter (Signed)
2nd lmom to schedule appt

## 2021-02-07 ENCOUNTER — Ambulatory Visit: Payer: Medicare Other

## 2021-02-07 NOTE — Progress Notes (Deleted)
Patient ID: MASEY SCHEIBER                 DOB: July 23, 1965                      MRN: [id number]     HPI: Rebecca Malone is a 56 y.o. female referred by Dr. Marland Kitchen to HTN clinic. PMH is significant for  Current HTN meds:  Previously tried:  BP goal:   Family History:   Social History:   Diet:   Exercise:   Home BP readings:   Wt Readings from Last 3 Encounters:  11/29/20 226 lb (102.5 kg)  09/12/20 233 lb 6.4 oz (105.9 kg)  02/29/20 234 lb (106.1 kg)   BP Readings from Last 3 Encounters:  11/30/20 (!) 182/87  09/12/20 (!) 180/80  02/29/20 (!) 150/80   Pulse Readings from Last 3 Encounters:  11/30/20 75  09/12/20 73  02/29/20 82    Renal function: CrCl cannot be calculated (Patient's most recent lab result is older than the maximum 21 days allowed.).  Past Medical History:  Diagnosis Date   Asthma    Degenerative arthritis    "knees" (03/07/2015)   GERD (gastroesophageal reflux disease)    History of blood transfusion 10/2009   "related to losing too much blood w/my period"   Hypertension    Obesity    OSA on CPAP    Pneumonia 2008   Tachycardia march 2014   admitted to hospital   Type II diabetes mellitus ([place])     Current Outpatient Medications on File Prior to Visit  Medication Sig Dispense Refill   albuterol (PROVENTIL) (2.5 MG/3ML) 0.083% nebulizer solution Take 3 mLs (2.5 mg total) by nebulization every 6 (six) hours as needed for wheezing or shortness of breath. 1 time refill for patient send to PCP 360 mL 2   apixaban (ELIQUIS) 5 MG TABS tablet Take 1 tablet (5 mg total) by mouth 2 (two) times daily. 180 tablet 0   diclofenac sodium (VOLTAREN) 1 % GEL Apply 4 g topically 4 (four) times daily as needed (knee pain).      famotidine (PEPCID) 20 MG tablet Take 1 tablet (20 mg total) by mouth at bedtime. NEED OV. 90 tablet 1   losartan-hydrochlorothiazide (HYZAAR) 50-12.5 MG tablet Take 1 tablet by mouth daily. 30 tablet 2   meloxicam (MOBIC) 15 MG  tablet Take 15 mg by mouth daily as needed for pain.     metFORMIN (GLUCOPHAGE) 1000 MG tablet Take 1 tablet (1,000 mg total) by mouth 2 (two) times daily with a meal. 60 tablet 0   metoprolol succinate (TOPROL-XL) 100 MG 24 hr tablet Take 100 mg by mouth daily. Take with or immediately following a meal.     metoprolol tartrate (LOPRESSOR) 50 MG tablet Take 50 mg by mouth daily as needed.     pantoprazole (PROTONIX) 40 MG tablet Take 1 tablet (40 mg total) by mouth daily. Take 30-60 min before first meal of the day 30 tablet 2   rosuvastatin (CRESTOR) 20 MG tablet Take 1 tablet (20 mg total) by mouth daily. 90 tablet 0   traMADol (ULTRAM) 50 MG tablet Take by mouth every 6 (six) hours as needed.     No current facility-administered medications on file prior to visit.    Allergies  Allergen Reactions   Advair Diskus [Fluticasone-Salmeterol] Other (See Comments)    Made sores on the back of her throat   Other Hives  and Itching    Patient states she is allergic to all generic medications. Can only have BRAND NAMES MEDS ONLY   Pork-Derived Products     Religious      Assessment/Plan:  1. Hypertension -

## 2021-02-14 IMAGING — CT CT HEART MORP W/ CTA COR W/ SCORE W/ CA W/CM &/OR W/O CM
4 of 7 series · 8 of 20 positions shown, 9 images · IV contrast (APPLIED)
Comparison: None.
COMPARISON: None.

Addendum:
EXAM:
OVER-READ INTERPRETATION  CT CHEST

The following report is an over-read performed by radiologist Dr.
Pinguin Ele [REDACTED] on 06/08/2019. This over-read
does not include interpretation of cardiac or coronary anatomy or
pathology. The coronary CTA interpretation by the cardiologist is
attached.
CLINICAL DATA: Chest pain
Cardiac CTA
MEDICATIONS:
Sub lingual nitro. 4mg x 2
TECHNIQUE: The patient was scanned on a Siemens [REDACTED]ice scanner. Gantry
rotation speed was 250 msecs. Collimation was 0.6 mm. A 100 kV
prospective scan was triggered in the ascending thoracic aorta at
35-75% of the R-R interval. Average HR during the scan was 60 bpm.
The 3D data set was interpreted on a dedicated work station using
MPR, MIP and VRT modes. A total of 80cc of contrast was used.

[Series 6: best diast 73 % · axial · 0.39mm/px · z∈[-196,-158]mm · 2 of 283 slices shown]
[im 95/283  vessel]
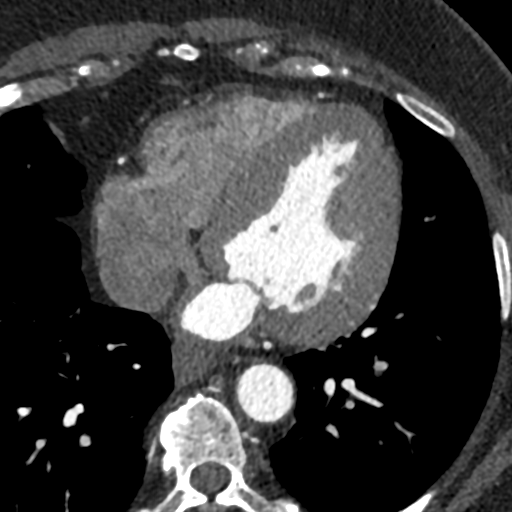
[im 189/283  vessel]
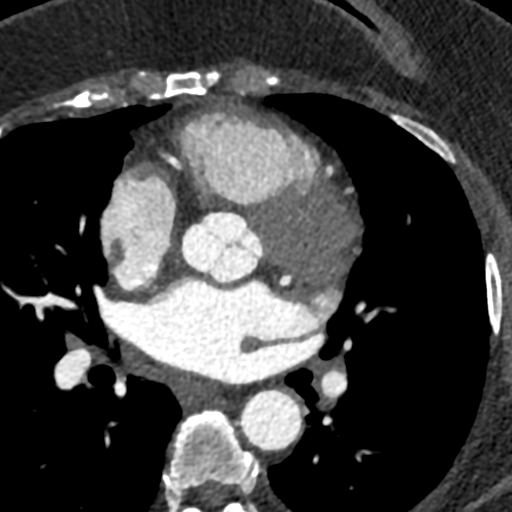

[Series 7: best syst · axial · 0.39mm/px · z∈[-196,-158]mm · 2 of 283 slices shown, 3 images]
[im 95/283  vessel]
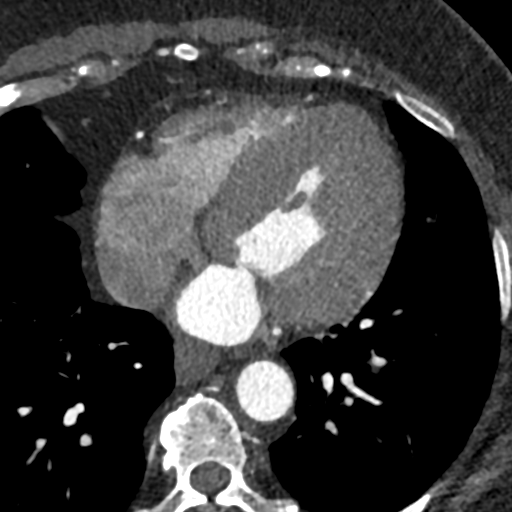
[im 95/283  lung]
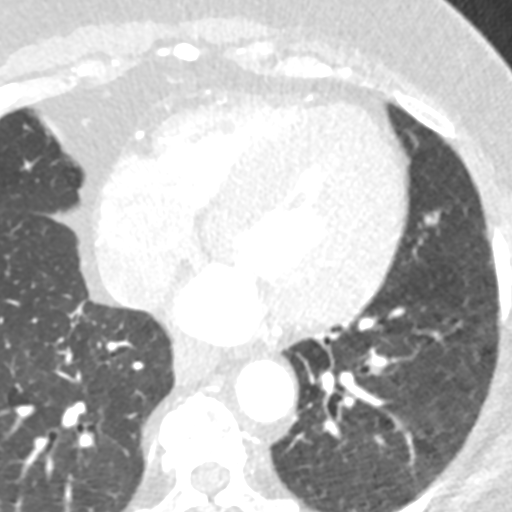
[im 189/283  vessel]
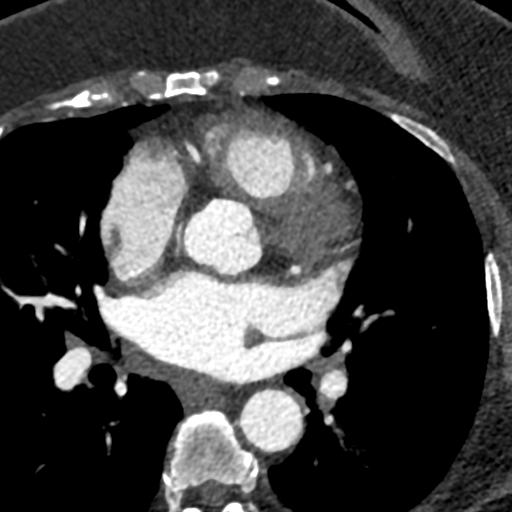

[Series 8: ts diast sharp · axial · 0.39mm/px · z∈[-196,-158]mm · 2 of 283 slices shown]
[im 95/283  lung]
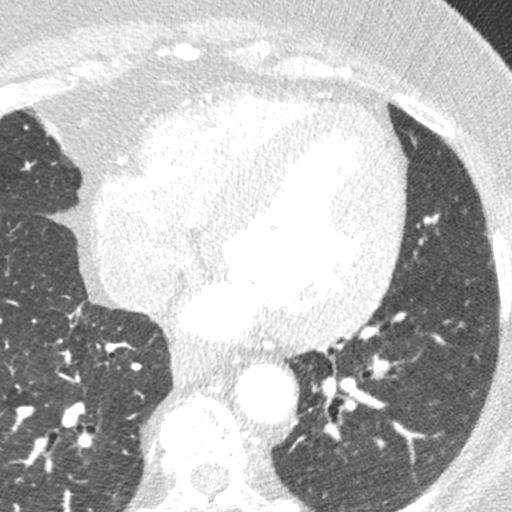
[im 189/283  lung]
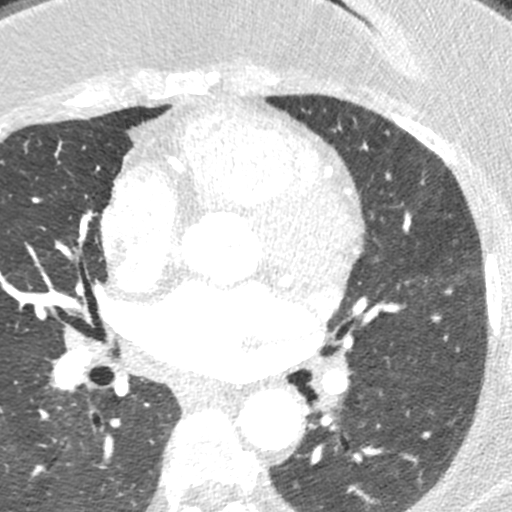

[Series 9: ts syst sharp · axial · 0.39mm/px · z∈[-196,-158]mm · 2 of 283 slices shown]
[im 95/283  lung]
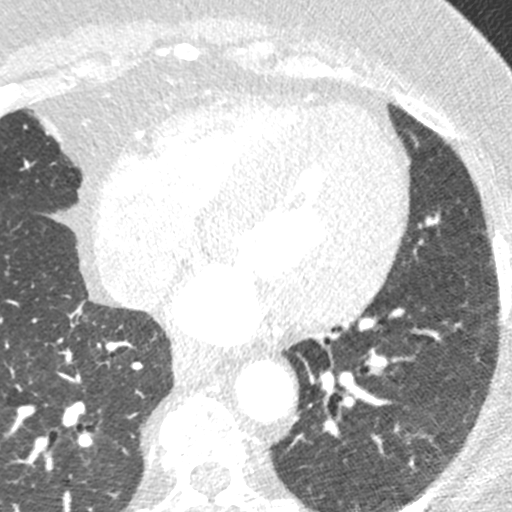
[im 189/283  lung]
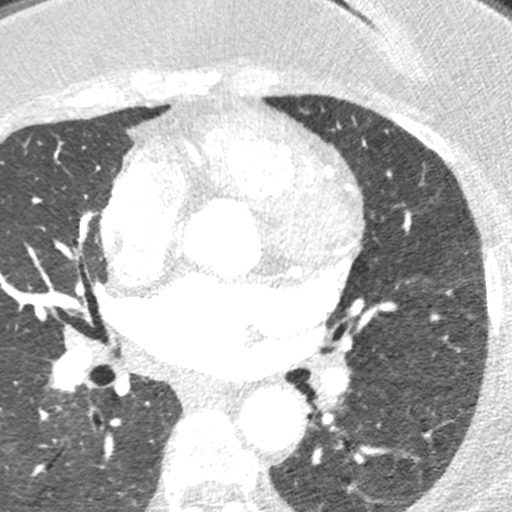

[8 of 20 positions shown; findings below may reference images not displayed]

FINDINGS: Vascular: Heart is normal size.  Visualized aorta normal caliber.

Mediastinum/Nodes: No adenopathy

Lungs/Pleura: No confluent opacities or effusions.

Upper Abdomen: Imaging into the upper abdomen shows no acute
findings.

Musculoskeletal: Chest wall soft tissues are unremarkable. No acute
bony abnormality.
IMPRESSION: No acute or significant extracardiac abnormality.
FINDINGS: Non-cardiac: See separate report from [REDACTED].

Pulmonary veins drain normally to the left atrium.

Calcium Score: 0 Agatston units.

Coronary Arteries: Right dominant with no anomalies

LM: No plaque or stenosis.

LAD system:  No plaque or stenosis.

Circumflex system: No plaque or stenosis.

RCA system: No plaque or stenosis.
IMPRESSION: 1. Coronary artery calcium score 0 Agatston units, suggesting low
risk for future cardiac events.

2.  No significant coronary disease noted.

Douglas Gc Aspiazu

*** End of Addendum ***
EXAM:
OVER-READ INTERPRETATION  CT CHEST

The following report is an over-read performed by radiologist Dr.
Pinguin Ele [REDACTED] on 06/08/2019. This over-read
does not include interpretation of cardiac or coronary anatomy or
pathology. The coronary CTA interpretation by the cardiologist is
attached.
FINDINGS: Vascular: Heart is normal size.  Visualized aorta normal caliber.

Mediastinum/Nodes: No adenopathy

Lungs/Pleura: No confluent opacities or effusions.

Upper Abdomen: Imaging into the upper abdomen shows no acute
findings.

Musculoskeletal: Chest wall soft tissues are unremarkable. No acute
bony abnormality.
IMPRESSION: No acute or significant extracardiac abnormality.

## 2021-10-25 ENCOUNTER — Observation Stay (HOSPITAL_COMMUNITY)
Admission: EM | Admit: 2021-10-25 | Discharge: 2021-10-27 | Disposition: A | Discharge status: Home / Self Care | Payer: Medicare Other | Attending: Internal Medicine | Admitting: Internal Medicine

## 2021-10-25 ENCOUNTER — Encounter (HOSPITAL_COMMUNITY): Payer: Self-pay | Admitting: *Deleted

## 2021-10-25 ENCOUNTER — Other Ambulatory Visit: Payer: Self-pay

## 2021-10-25 DIAGNOSIS — E876 Hypokalemia: Secondary | ICD-10-CM

## 2021-10-25 DIAGNOSIS — R079 Chest pain, unspecified: Secondary | ICD-10-CM | POA: Diagnosis not present

## 2021-10-25 DIAGNOSIS — I110 Hypertensive heart disease with heart failure: Secondary | ICD-10-CM | POA: Diagnosis not present

## 2021-10-25 DIAGNOSIS — I4891 Unspecified atrial fibrillation: Secondary | ICD-10-CM | POA: Diagnosis not present

## 2021-10-25 DIAGNOSIS — R112 Nausea with vomiting, unspecified: Secondary | ICD-10-CM | POA: Diagnosis not present

## 2021-10-25 DIAGNOSIS — Z20822 Contact with and (suspected) exposure to covid-19: Secondary | ICD-10-CM | POA: Insufficient documentation

## 2021-10-25 DIAGNOSIS — I2510 Atherosclerotic heart disease of native coronary artery without angina pectoris: Secondary | ICD-10-CM | POA: Insufficient documentation

## 2021-10-25 DIAGNOSIS — D259 Leiomyoma of uterus, unspecified: Secondary | ICD-10-CM | POA: Insufficient documentation

## 2021-10-25 DIAGNOSIS — I5032 Chronic diastolic (congestive) heart failure: Secondary | ICD-10-CM

## 2021-10-25 DIAGNOSIS — R410 Disorientation, unspecified: Secondary | ICD-10-CM | POA: Insufficient documentation

## 2021-10-25 DIAGNOSIS — Z79899 Other long term (current) drug therapy: Secondary | ICD-10-CM | POA: Diagnosis not present

## 2021-10-25 DIAGNOSIS — R778 Other specified abnormalities of plasma proteins: Secondary | ICD-10-CM | POA: Diagnosis not present

## 2021-10-25 DIAGNOSIS — J9601 Acute respiratory failure with hypoxia: Secondary | ICD-10-CM | POA: Diagnosis not present

## 2021-10-25 DIAGNOSIS — F17210 Nicotine dependence, cigarettes, uncomplicated: Secondary | ICD-10-CM | POA: Insufficient documentation

## 2021-10-25 DIAGNOSIS — E119 Type 2 diabetes mellitus without complications: Secondary | ICD-10-CM | POA: Diagnosis not present

## 2021-10-25 DIAGNOSIS — J45909 Unspecified asthma, uncomplicated: Secondary | ICD-10-CM | POA: Diagnosis not present

## 2021-10-25 DIAGNOSIS — I480 Paroxysmal atrial fibrillation: Principal | ICD-10-CM | POA: Insufficient documentation

## 2021-10-25 DIAGNOSIS — Z7901 Long term (current) use of anticoagulants: Secondary | ICD-10-CM | POA: Diagnosis not present

## 2021-10-25 DIAGNOSIS — N179 Acute kidney failure, unspecified: Secondary | ICD-10-CM | POA: Diagnosis not present

## 2021-10-25 DIAGNOSIS — J449 Chronic obstructive pulmonary disease, unspecified: Secondary | ICD-10-CM | POA: Insufficient documentation

## 2021-10-25 DIAGNOSIS — R002 Palpitations: Secondary | ICD-10-CM | POA: Diagnosis present

## 2021-10-25 DIAGNOSIS — K5730 Diverticulosis of large intestine without perforation or abscess without bleeding: Secondary | ICD-10-CM | POA: Diagnosis not present

## 2021-10-25 DIAGNOSIS — I10 Essential (primary) hypertension: Secondary | ICD-10-CM | POA: Diagnosis present

## 2021-10-25 DIAGNOSIS — Z7984 Long term (current) use of oral hypoglycemic drugs: Secondary | ICD-10-CM | POA: Diagnosis not present

## 2021-10-25 DIAGNOSIS — R000 Tachycardia, unspecified: Secondary | ICD-10-CM | POA: Diagnosis not present

## 2021-10-25 DIAGNOSIS — F1410 Cocaine abuse, uncomplicated: Secondary | ICD-10-CM

## 2021-10-25 DIAGNOSIS — R7989 Other specified abnormal findings of blood chemistry: Secondary | ICD-10-CM

## 2021-10-25 DIAGNOSIS — R0600 Dyspnea, unspecified: Secondary | ICD-10-CM | POA: Diagnosis not present

## 2021-10-25 HISTORY — DX: Cardiac arrhythmia, unspecified: I49.9

## 2021-10-25 HISTORY — DX: Acute myocardial infarction, unspecified: I21.9

## 2021-10-25 LAB — I-STAT CHEM 8, ED
BUN: 17 mg/dL (ref 6–20)
Calcium, Ion: 1.05 mmol/L — ABNORMAL LOW (ref 1.15–1.40)
Chloride: 106 mmol/L (ref 98–111)
Creatinine, Ser: 1 mg/dL (ref 0.44–1.00)
Glucose, Bld: 285 mg/dL — ABNORMAL HIGH (ref 70–99)
HCT: 48 % — ABNORMAL HIGH (ref 36.0–46.0)
Hemoglobin: 16.3 g/dL — ABNORMAL HIGH (ref 12.0–15.0)
Potassium: 3.2 mmol/L — ABNORMAL LOW (ref 3.5–5.1)
Sodium: 138 mmol/L (ref 135–145)
TCO2: 20 mmol/L — ABNORMAL LOW (ref 22–32)

## 2021-10-25 LAB — I-STAT VENOUS BLOOD GAS, ED
Acid-base deficit: 2 mmol/L (ref 0.0–2.0)
Bicarbonate: 19.5 mmol/L — ABNORMAL LOW (ref 20.0–28.0)
Calcium, Ion: 1.07 mmol/L — ABNORMAL LOW (ref 1.15–1.40)
HCT: 46 % (ref 36.0–46.0)
Hemoglobin: 15.6 g/dL — ABNORMAL HIGH (ref 12.0–15.0)
O2 Saturation: 96 %
Potassium: 3.2 mmol/L — ABNORMAL LOW (ref 3.5–5.1)
Sodium: 137 mmol/L (ref 135–145)
TCO2: 20 mmol/L — ABNORMAL LOW (ref 22–32)
pCO2, Ven: 24.5 mmHg — ABNORMAL LOW (ref 44–60)
pH, Ven: 7.509 — ABNORMAL HIGH (ref 7.25–7.43)
pO2, Ven: 71 mmHg — ABNORMAL HIGH (ref 32–45)

## 2021-10-25 LAB — CBC WITH DIFFERENTIAL/PLATELET
Abs Immature Granulocytes: 0.01 10*3/uL (ref 0.00–0.07)
Basophils Absolute: 0 10*3/uL (ref 0.0–0.1)
Basophils Relative: 0 %
Eosinophils Absolute: 0.2 10*3/uL (ref 0.0–0.5)
Eosinophils Relative: 3 %
HCT: 45.4 % (ref 36.0–46.0)
Hemoglobin: 14.6 g/dL (ref 12.0–15.0)
Immature Granulocytes: 0 %
Lymphocytes Relative: 29 %
Lymphs Abs: 2.1 10*3/uL (ref 0.7–4.0)
MCH: 27.8 pg (ref 26.0–34.0)
MCHC: 32.2 g/dL (ref 30.0–36.0)
MCV: 86.5 fL (ref 80.0–100.0)
Monocytes Absolute: 0.6 10*3/uL (ref 0.1–1.0)
Monocytes Relative: 9 %
Neutro Abs: 4.3 10*3/uL (ref 1.7–7.7)
Neutrophils Relative %: 59 %
Platelets: 246 10*3/uL (ref 150–400)
RBC: 5.25 MIL/uL — ABNORMAL HIGH (ref 3.87–5.11)
RDW: 14.2 % (ref 11.5–15.5)
WBC: 7.3 10*3/uL (ref 4.0–10.5)
nRBC: 0 % (ref 0.0–0.2)

## 2021-10-25 LAB — COMPREHENSIVE METABOLIC PANEL
ALT: 23 U/L (ref 0–44)
AST: 21 U/L (ref 15–41)
Albumin: 3.9 g/dL (ref 3.5–5.0)
Alkaline Phosphatase: 86 U/L (ref 38–126)
Anion gap: 17 — ABNORMAL HIGH (ref 5–15)
BUN: 16 mg/dL (ref 6–20)
CO2: 18 mmol/L — ABNORMAL LOW (ref 22–32)
Calcium: 9.6 mg/dL (ref 8.9–10.3)
Chloride: 104 mmol/L (ref 98–111)
Creatinine, Ser: 1.21 mg/dL — ABNORMAL HIGH (ref 0.44–1.00)
GFR, Estimated: 53 mL/min — ABNORMAL LOW (ref >60)
Glucose, Bld: 278 mg/dL — ABNORMAL HIGH (ref 70–99)
Potassium: 3.3 mmol/L — ABNORMAL LOW (ref 3.5–5.1)
Sodium: 139 mmol/L (ref 135–145)
Total Bilirubin: 1 mg/dL (ref 0.3–1.2)
Total Protein: 7.7 g/dL (ref 6.5–8.1)

## 2021-10-25 LAB — TROPONIN I (HIGH SENSITIVITY): Troponin I (High Sensitivity): 17 ng/L (ref <18)

## 2021-10-25 MED ORDER — ETOMIDATE 2 MG/ML IV SOLN
10.0000 mg | Freq: Once | INTRAVENOUS | Status: AC
  Administered 2021-10-25: 10 mg via INTRAVENOUS

## 2021-10-25 MED ORDER — PHENYLEPHRINE 80 MCG/ML (10ML) SYRINGE FOR IV PUSH (FOR BLOOD PRESSURE SUPPORT)
160.0000 ug | PREFILLED_SYRINGE | Freq: Once | INTRAVENOUS | Status: AC
  Administered 2021-10-25: 160 ug via INTRAVENOUS

## 2021-10-25 MED ORDER — DILTIAZEM HCL 25 MG/5ML IV SOLN
INTRAVENOUS | Status: AC
  Filled 2021-10-25: qty 5

## 2021-10-25 MED ORDER — ONDANSETRON HCL 4 MG/2ML IJ SOLN
INTRAMUSCULAR | Status: AC
  Administered 2021-10-25: 4 mg
  Filled 2021-10-25: qty 2

## 2021-10-25 MED ORDER — LACTATED RINGERS IV BOLUS
1000.0000 mL | Freq: Once | INTRAVENOUS | Status: AC
  Administered 2021-10-25: 1000 mL via INTRAVENOUS

## 2021-10-25 MED ORDER — DILTIAZEM HCL 25 MG/5ML IV SOLN: 10.0000 mg | Freq: Once | INTRAVENOUS | Status: DC

## 2021-10-25 NOTE — Progress Notes (Signed)
Rt standby for cardio version. Jaw thrust until pt awake.vitals are stable at this time.

## 2021-10-25 NOTE — ED Notes (Addendum)
Pt cardioverted at this time, unable to perform consent as patient is unstable, decreased responsiveness, and diaphoretic prior to procedure, performed emergently

## 2021-10-25 NOTE — ED Provider Notes (Signed)
Care assumed from Dr. Matilde Sprang, patient presented with atrial fibrillation with rapid ventricular response, hypotensive, was cardioverted to NSR. She is pending CT angiogram of the chest. May need observation admission given how sick she was on arrival.  CT angiogram of the chest shows no evidence of pulmonary embolism, but atelectasis and/or infiltrate of the lower lobes and left upper lobe.  CT of head shows no acute process.  Have independently viewed the images, and agree with radiologist interpretation.  Clinically, I do not believe she has pneumonia as there is no fever and no cough.  Drug screen is positive for cocaine, which is probably what triggered her episode of atrial fibrillation.  Troponin has risen, felt to represent demand ischemia and not ACS.  I went to evaluate the patient, and she was noted to be hypoxic with oxygen saturation of 90% while on oxygen 3 L/min nasal cannula.  At this point, I feel she needs admission for cardiac monitoring as well as supplemental oxygen.  Case is discussed with Dr. Nicholes Mango or [facility] hospitalist, who agrees to admit the patient.  Results for orders placed or performed during the hospital encounter of 10/25/21  Comprehensive metabolic panel  Result Value Ref Range   Sodium 139 135 - 145 mmol/L   Potassium 3.3 (L) 3.5 - 5.1 mmol/L   Chloride 104 98 - 111 mmol/L   CO2 18 (L) 22 - 32 mmol/L   Glucose, Bld 278 (H) 70 - 99 mg/dL   BUN 16 6 - 20 mg/dL   Creatinine, Ser 1.21 (H) 0.44 - 1.00 mg/dL   Calcium 9.6 8.9 - 10.3 mg/dL   Total Protein 7.7 6.5 - 8.1 g/dL   Albumin 3.9 3.5 - 5.0 g/dL   AST 21 15 - 41 U/L   ALT 23 0 - 44 U/L   Alkaline Phosphatase 86 38 - 126 U/L   Total Bilirubin 1.0 0.3 - 1.2 mg/dL   GFR, Estimated 53 (L) >60 mL/min   Anion gap 17 (H) 5 - 15  CBC with Differential  Result Value Ref Range   WBC 7.3 4.0 - 10.5 K/uL   RBC 5.25 (H) 3.87 - 5.11 MIL/uL   Hemoglobin 14.6 12.0 - 15.0 g/dL   HCT 45.4 36.0 - 46.0 %   MCV 86.5 80.0 -  100.0 fL   MCH 27.8 26.0 - 34.0 pg   MCHC 32.2 30.0 - 36.0 g/dL   RDW 14.2 11.5 - 15.5 %   Platelets 246 150 - 400 K/uL   nRBC 0.0 0.0 - 0.2 %   Neutrophils Relative % 59 %   Neutro Abs 4.3 1.7 - 7.7 K/uL   Lymphocytes Relative 29 %   Lymphs Abs 2.1 0.7 - 4.0 K/uL   Monocytes Relative 9 %   Monocytes Absolute 0.6 0.1 - 1.0 K/uL   Eosinophils Relative 3 %   Eosinophils Absolute 0.2 0.0 - 0.5 K/uL   Basophils Relative 0 %   Basophils Absolute 0.0 0.0 - 0.1 K/uL   Immature Granulocytes 0 %   Abs Immature Granulocytes 0.01 0.00 - 0.07 K/uL  Ethanol  Result Value Ref Range   Alcohol, Ethyl (B) <10 <10 mg/dL  Rapid urine drug screen (hospital performed)  Result Value Ref Range   Opiates NONE DETECTED NONE DETECTED   Cocaine POSITIVE (A) NONE DETECTED   Benzodiazepines NONE DETECTED NONE DETECTED   Amphetamines NONE DETECTED NONE DETECTED   Tetrahydrocannabinol NONE DETECTED NONE DETECTED   Barbiturates NONE DETECTED NONE DETECTED  I-Stat  venous blood gas, ED  Result Value Ref Range   pH, Ven 7.509 (H) 7.25 - 7.43   pCO2, Ven 24.5 (L) 44 - 60 mmHg   pO2, Ven 71 (H) 32 - 45 mmHg   Bicarbonate 19.5 (L) 20.0 - 28.0 mmol/L   TCO2 20 (L) 22 - 32 mmol/L   O2 Saturation 96 %   Acid-base deficit 2.0 0.0 - 2.0 mmol/L   Sodium 137 135 - 145 mmol/L   Potassium 3.2 (L) 3.5 - 5.1 mmol/L   Calcium, Ion 1.07 (L) 1.15 - 1.40 mmol/L   HCT 46.0 36.0 - 46.0 %   Hemoglobin 15.6 (H) 12.0 - 15.0 g/dL   Sample type VENOUS   I-stat chem 8, ED (not at [facility] or [facility])  Result Value Ref Range   Sodium 138 135 - 145 mmol/L   Potassium 3.2 (L) 3.5 - 5.1 mmol/L   Chloride 106 98 - 111 mmol/L   BUN 17 6 - 20 mg/dL   Creatinine, Ser 1.00 0.44 - 1.00 mg/dL   Glucose, Bld 285 (H) 70 - 99 mg/dL   Calcium, Ion 1.05 (L) 1.15 - 1.40 mmol/L   TCO2 20 (L) 22 - 32 mmol/L   Hemoglobin 16.3 (H) 12.0 - 15.0 g/dL   HCT 48.0 (H) 36.0 - 46.0 %  Troponin I (High Sensitivity)  Result Value Ref Range   Troponin I  (High Sensitivity) 17 <18 ng/L  Troponin I (High Sensitivity)  Result Value Ref Range   Troponin I (High Sensitivity) 23 (H) <18 ng/L   CT Head Wo Contrast  Result Date: 10/26/2021 CLINICAL DATA:  Delirium. EXAM: CT HEAD WITHOUT CONTRAST TECHNIQUE: Contiguous axial images were obtained from the base of the skull through the vertex without intravenous contrast. RADIATION DOSE REDUCTION: This exam was performed according to the departmental dose-optimization program which includes automated exposure control, adjustment of the mA and/or kV according to patient size and/or use of iterative reconstruction technique. COMPARISON:  None Available. FINDINGS: Brain: No evidence of acute infarction, hemorrhage, hydrocephalus, extra-axial collection or mass lesion/mass effect. Vascular: No hyperdense vessel or unexpected calcification. Skull: Normal. Negative for fracture or focal lesion. Sinuses/Orbits: No acute finding. Other: None. IMPRESSION: No acute intracranial pathology. Electronically Signed   By: Virgina Norfolk M.D.   On: 10/26/2021 01:16   CT Angio Chest/Abd/Pel for Dissection W and/or Wo Contrast  Result Date: 10/26/2021 CLINICAL DATA:  Chest pain. EXAM: CT ANGIOGRAPHY CHEST, ABDOMEN AND PELVIS TECHNIQUE: Non-contrast CT of the chest was initially obtained. Multidetector CT imaging through the chest, abdomen and pelvis was performed using the standard protocol during bolus administration of intravenous contrast. Multiplanar reconstructed images and MIPs were obtained and reviewed to evaluate the vascular anatomy. RADIATION DOSE REDUCTION: This exam was performed according to the departmental dose-optimization program which includes automated exposure control, adjustment of the mA and/or kV according to patient size and/or use of iterative reconstruction technique. CONTRAST:  124m OMNIPAQUE IOHEXOL 350 MG/ML SOLN COMPARISON:  Jun 08, 2019 FINDINGS: CTA CHEST FINDINGS Cardiovascular: The thoracic aorta  is normal in appearance, without evidence of aortic aneurysm or dissection. Satisfactory opacification of the pulmonary arteries to the segmental level. No evidence of pulmonary embolism. Normal heart size. No pericardial effusion. Mediastinum/Nodes: No enlarged mediastinal, hilar, or axillary lymph nodes. Thyroid gland, trachea, and esophagus demonstrate no significant findings. Lungs/Pleura: Moderate to marked severity posterior left upper lobe and bilateral lower lobe atelectasis and/or infiltrate is noted. There is no evidence of a pleural effusion or pneumothorax.  Musculoskeletal: No chest wall abnormality. No acute or significant osseous findings. Review of the MIP images confirms the above findings. CTA ABDOMEN AND PELVIS FINDINGS VASCULAR Aorta: Normal caliber aorta without aneurysm, dissection, vasculitis or significant stenosis. Celiac: Patent without evidence of aneurysm, dissection, vasculitis or significant stenosis. SMA: Patent without evidence of aneurysm, dissection, vasculitis or significant stenosis. Renals: Both renal arteries are patent without evidence of aneurysm, dissection, vasculitis, fibromuscular dysplasia or significant stenosis. IMA: Patent without evidence of aneurysm, dissection, vasculitis or significant stenosis. Inflow: Patent without evidence of aneurysm, dissection, vasculitis or significant stenosis. Veins: No obvious venous abnormality within the limitations of this arterial phase study. Review of the MIP images confirms the above findings. NON-VASCULAR Hepatobiliary: No focal liver abnormality is seen. No gallstones, gallbladder wall thickening, or biliary dilatation. Pancreas: Unremarkable. No pancreatic ductal dilatation or surrounding inflammatory changes. Spleen: Normal in size without focal abnormality. Adrenals/Urinary Tract: Adrenal glands are unremarkable. Kidneys are normal, without renal calculi, focal lesion, or hydronephrosis. Bladder is unremarkable. Stomach/Bowel:  There is a small hiatal hernia. Appendix appears normal. No evidence of bowel wall thickening, distention, or inflammatory changes. Noninflamed diverticula are seen throughout the large bowel. Lymphatic: No abnormal abdominal or pelvic lymph nodes are identified. Reproductive: The uterine fundus is enlarged and mildly lobulated in appearance. The bilateral adnexa are unremarkable. Other: No abdominal wall hernia or abnormality. No abdominopelvic ascites. Musculoskeletal: No acute or significant osseous findings. Review of the MIP images confirms the above findings. IMPRESSION: 1. No evidence of pulmonary embolism. 2. Moderate to marked severity posterior left upper lobe and bilateral lower lobe atelectasis and/or infiltrate. 3. Small hiatal hernia. 4. Colonic diverticulosis. 5. Findings likely consistent with a fibroid uterus. Correlation with nonemergent pelvic ultrasound is recommended. Electronically Signed   By: Virgina Norfolk M.D.   On: 10/26/2021 01:15   DG Chest Portable 1 View  Result Date: 10/26/2021 CLINICAL DATA:  Dyspnea and tachycardia. EXAM: PORTABLE CHEST 1 VIEW COMPARISON:  November 30, 2020 FINDINGS: Moderate severity areas of bilateral perihilar and bibasilar atelectasis and/or infiltrate is seen. There is no evidence of a pleural effusion or pneumothorax. The heart size and mediastinal contours are within normal limits. Degenerative changes seen throughout the thoracic spine. IMPRESSION: Moderate severity bilateral perihilar and bibasilar atelectasis and/or infiltrate. Electronically Signed   By: Virgina Norfolk M.D.   On: 30/16/0109 32:35      Delora Fuel, MD 57/32/20 [phone]

## 2021-10-25 NOTE — ED Triage Notes (Signed)
Pt to triage stating her heart is jumping out of her chest, vomiting, diaphoretic.  Per tech, hr 150's and pressures in the 90's.

## 2021-10-25 NOTE — ED Notes (Signed)
Unable to obtain large bore IV for CT dissection study, attempted x 3 by RN s and x1 [place] by MD. IV team consulted.

## 2021-10-25 NOTE — ED Provider Notes (Signed)
[facility] EMERGENCY DEPARTMENT Provider Note  CSN: [id number] Arrival date & time: 10/25/21 2132  Chief Complaint(s) Tachycardia  HPI Rebecca Malone is a 56 y.o. female with PMH T2DM, previous MI in 2014, HTN, GERD, COPD, polysubstance abuse who presents emergency department for evaluation of chest pain, shortness of breath, nausea, vomiting and diaphoresis.  Patient reports that she was eating spicy chicken earlier today when she felt an acute onset sensation of severe chest pain with sweating and shortness of breath.  Patient arrives in extremis and unable to give a full history but arrives with an irregular tachycardia, hypoxia, diaphoresis and is actively vomiting.  Additional history unable to obtain secondary to patient's current critical illness   Past Medical History Past Medical History:  Diagnosis Date   Asthma    Degenerative arthritis    "knees" (03/07/2015)   GERD (gastroesophageal reflux disease)    History of blood transfusion 10/2009   "related to losing too much blood w/my period"   Hypertension    Irregular heart beat    Myocardial infarction ([place])    Obesity    OSA on CPAP    Pneumonia 2008   Tachycardia 03/2012   admitted to hospital   Type II diabetes mellitus [facility])    Patient Active Problem List   Diagnosis Date Noted   Pain in joint of right shoulder 08/06/2020   Cocaine abuse ([place]) 05/26/2019   Atrial fibrillation with rapid ventricular response ([place]) 99/37/1696   Diastolic CHF, acute ([place]) 09/02/2017   HCVD (hypertensive cardiovascular disease) 09/02/2017   Influenza A 03/07/2015   Right knee pain 04/10/2014   Essential hypertension 06/09/2013   Palpitations 06/07/2012   Cigarette smoker 06/07/2012   Acute asthma exacerbation 04/08/2012   CAP (community acquired pneumonia) 04/08/2012   SOB (shortness of breath) 04/08/2012   Non-insulin treated type 2 diabetes mellitus ([place]) 09/11/2011   Degenerative arthritis of knee  09/11/2011   Neuropathy due to secondary diabetes ([place]) 09/11/2011   Chest pain on exertion 09/09/2011   Edema extremities 09/09/2011   Anemia 09/09/2011   OBESITY, MORBID 06/28/2008   Cough variant asthma vs uacs 06/28/2008   BACK PAIN, LUMBAR 06/28/2008   Home Medication(s) Prior to Admission medications   Medication Sig Start Date End Date Taking? Authorizing Provider  albuterol (PROVENTIL) (2.5 MG/3ML) 0.083% nebulizer solution Take 3 mLs (2.5 mg total) by nebulization every 6 (six) hours as needed for wheezing or shortness of breath. 1 time refill for patient send to PCP 09/12/18   Almyra Deforest, PA  apixaban (ELIQUIS) 5 MG TABS tablet Take 1 tablet (5 mg total) by mouth 2 (two) times daily. 05/26/19 08/24/19  [place] Eric J, DO  diclofenac sodium (VOLTAREN) 1 % GEL Apply 4 g topically 4 (four) times daily as needed (knee pain).     [provider]  famotidine (PEPCID) 20 MG tablet Take 1 tablet (20 mg total) by mouth at bedtime. NEED OV. 12/30/20   Croitoru, Mihai, MD  losartan-hydrochlorothiazide (HYZAAR) 50-12.5 MG tablet Take 1 tablet by mouth daily. 05/24/19   Almyra Deforest, PA  meloxicam (MOBIC) 15 MG tablet Take 15 mg by mouth daily as needed for pain.    [provider]  metFORMIN (GLUCOPHAGE) 1000 MG tablet Take 1 tablet (1,000 mg total) by mouth 2 (two) times daily with a meal. 09/12/18 05/24/19  Almyra Deforest, PA  metoprolol succinate (TOPROL-XL) 100 MG 24 hr tablet Take 100 mg by mouth daily. Take with or immediately following a  meal.    [provider]  metoprolol tartrate (LOPRESSOR) 50 MG tablet Take 50 mg by mouth daily as needed.    [provider]  pantoprazole (PROTONIX) 40 MG tablet Take 1 tablet (40 mg total) by mouth daily. Take 30-60 min before first meal of the day 05/24/19   Almyra Deforest, PA  rosuvastatin (CRESTOR) 20 MG tablet Take 1 tablet (20 mg total) by mouth daily. 05/26/19 08/24/19  [place] Donnamarie Poag, DO  traMADol (ULTRAM) 50 MG tablet Take by  mouth every 6 (six) hours as needed.    [provider]                                                                                                                                    Past Surgical History Past Surgical History:  Procedure Laterality Date   CESAREAN SECTION  1999   Family History Family History  Problem Relation Age of Onset   Heart attack Father    Diabetes Sister    Diabetes Brother     Social History Social History   Tobacco Use   Smoking status: Every Day    Packs/day: 0.35    Years: 20.00    Total pack years: 7.00    Types: Cigarettes   Smokeless tobacco: Never   Tobacco comments:    1 pack last her a week  Vaping Use   Vaping Use: Never used  Substance Use Topics   Alcohol use: Yes    Comment: socially   Drug use: No   Allergies Advair diskus [fluticasone-salmeterol], Other, and Pork-derived products  Review of Systems Review of Systems  Unable to perform ROS: Acuity of condition    Physical Exam Vital Signs  I have reviewed the triage vital signs BP 137/68   Pulse (!) 101   Temp (!) 97.5 F (36.4 C) (Axillary)   Resp (!) 21   Ht '5\' 5"'$  (1.651 m)   Wt 102.5 kg   LMP 09/30/2012   SpO2 94%   BMI 37.60 kg/m   Physical Exam Vitals and nursing note reviewed.  Constitutional:      General: She is in acute distress.     Appearance: She is well-developed. She is ill-appearing, toxic-appearing and diaphoretic.  HENT:     Head: Normocephalic and atraumatic.  Eyes:     Conjunctiva/sclera: Conjunctivae normal.  Cardiovascular:     Rate and Rhythm: Tachycardia present. Rhythm irregular.     Heart sounds: No murmur heard. Pulmonary:     Effort: Pulmonary effort is normal. No respiratory distress.     Breath sounds: Normal breath sounds.  Abdominal:     Palpations: Abdomen is soft.     Tenderness: There is no abdominal tenderness.  Musculoskeletal:        General: No swelling.     Cervical back: Neck supple.  Skin:     General: Skin is warm.  Capillary Refill: Capillary refill takes less than 2 seconds.  Neurological:     Mental Status: She is alert.  Psychiatric:        Mood and Affect: Mood normal.     ED Results and Treatments Labs (all labs ordered are listed, but only abnormal results are displayed) Labs Reviewed  COMPREHENSIVE METABOLIC PANEL - Abnormal; Notable for the following components:      Result Value   Potassium 3.3 (*)    CO2 18 (*)    Glucose, Bld 278 (*)    Creatinine, Ser 1.21 (*)    GFR, Estimated 53 (*)    Anion gap 17 (*)    All other components within normal limits  CBC WITH DIFFERENTIAL/PLATELET - Abnormal; Notable for the following components:   RBC 5.25 (*)    All other components within normal limits  I-STAT VENOUS BLOOD GAS, ED - Abnormal; Notable for the following components:   pH, Ven 7.509 (*)    pCO2, Ven 24.5 (*)    pO2, Ven 71 (*)    Bicarbonate 19.5 (*)    TCO2 20 (*)    Potassium 3.2 (*)    Calcium, Ion 1.07 (*)    Hemoglobin 15.6 (*)    All other components within normal limits  I-STAT CHEM 8, ED - Abnormal; Notable for the following components:   Potassium 3.2 (*)    Glucose, Bld 285 (*)    Calcium, Ion 1.05 (*)    TCO2 20 (*)    Hemoglobin 16.3 (*)    HCT 48.0 (*)    All other components within normal limits  TROPONIN I (HIGH SENSITIVITY)  TROPONIN I (HIGH SENSITIVITY)                                                                                                                          Radiology No results found.  Pertinent labs & imaging results that were available during my care of the patient were reviewed by me and considered in my medical decision making (see MDM for details).  Medications Ordered in ED Medications  lactated ringers bolus 1,000 mL (0 mLs Intravenous Stopped 10/25/21 2226)  ondansetron (ZOFRAN) 4 MG/2ML injection (4 mg  Given 10/25/21 2210)  PHENYLephrine 80 mcg/ml in normal saline Adult IV Push Syringe (For Blood  Pressure Support) (160 mcg Intravenous Given 10/25/21 2213)  etomidate (AMIDATE) injection 10 mg (10 mg Intravenous Given by Other 10/25/21 2212)  Procedures .Critical Care  Performed by: Teressa Lower, MD Authorized by: Teressa Lower, MD   Critical care provider statement:    Critical care time (minutes):  76   Critical care was necessary to treat or prevent imminent or life-threatening deterioration of the following conditions:  Cardiac failure, respiratory failure and shock   Critical care was time spent personally by me on the following activities:  Development of treatment plan with patient or surrogate, discussions with consultants, evaluation of patient's response to treatment, examination of patient, ordering and review of laboratory studies, ordering and review of radiographic studies, ordering and performing treatments and interventions, pulse oximetry, re-evaluation of patient's condition and review of old charts .Sedation  Date/Time: 10/25/2021 11:51 PM  Performed by: Teressa Lower, MD Authorized by: Teressa Lower, MD   Consent:    Consent obtained:  Emergent situation Universal protocol:    Immediately prior to procedure, a time out was called: yes   Pre-sedation assessment:    Time since last food or drink:  Unknown   ASA classification: class 3 - patient with severe systemic disease     Mallampati score:  Unable to assess   Pre-sedation assessments completed and reviewed: airway patency, cardiovascular function, hydration status, mental status, nausea/vomiting, pain level, respiratory function and temperature   Procedure details (see MAR for exact dosages):    Preoxygenation:  Nonrebreather mask   Sedation:  Etomidate   Intended level of sedation: moderate (conscious sedation)   Analgesia:  None   Intra-procedure monitoring:  Blood  pressure monitoring, continuous capnometry, frequent LOC assessments, continuous pulse oximetry and cardiac monitor   Intra-procedure events: none     Total Provider sedation time (minutes):  15 Post-procedure details:    Procedure completion:  Tolerated well, no immediate complications .Cardioversion  Date/Time: 10/25/2021 11:52 PM  Performed by: Teressa Lower, MD Authorized by: Teressa Lower, MD   Consent:    Consent obtained:  Verbal   Consent given by:  Patient   Risks discussed:  Cutaneous burn, death, pain and induced arrhythmia   Alternatives discussed:  Rate-control medication Pre-procedure details:    Cardioversion basis:  Elective   Rhythm:  Atrial flutter   Electrode placement:  Anterior-posterior Patient sedated: Yes. Refer to sedation procedure documentation for details of sedation.  Attempt one:    Cardioversion mode:  Synchronous   Shock (Joules):  200   Shock outcome:  Conversion to normal sinus rhythm Post-procedure details:    Patient status:  Awake   Patient tolerance of procedure:  Tolerated well, no immediate complications   (including critical care time)  Medical Decision Making / ED Course   This patient presents to the ED for concern of rapid heart rate, chest pain, shortness of breath, this involves an extensive number of treatment options, and is a complaint that carries with it a high risk of complications and morbidity.  The differential diagnosis includes A-fib with RVR, aortic dissection, PE, ACS, MI, pneumonia  MDM: Patient seen emergency department for evaluation of multiple complaints as described above.  Physical exam reveals a toxic appearing patient that is diaphoretic and frequently telling providers that she feels like she is about to die.  Patient arrives hypoxic, hypotensive and tachycardic and fluid resuscitation begun, nonrebreather mask placed and initial cardiac rhythm appears to be A-fib with RVR.  Patient started to vomit here in  the emergency department but was still awake enough to intermittently answer questions.  Given patient's toxic presentation, I made the decision to perform an  emergency conscious sedation and bedside synchronized cardioversion which led to conversion to normal sinus rhythm and significant improvement in the patient's symptoms.  Pressures then stabilized.  Initial laboratory evaluation with some mild hypokalemia to 3.3, creatinine elevated to 1.21, pH 7.5 with no hypercarbia, initial troponin 17.  Patient ultimately weaned down to nasal cannula but is persistently requiring oxygen.  At time of signout, patient is pending a dissection study.  Please see provider signout for continuation of work-up.  Anticipate admission.   Additional history obtained:  -External records from outside source obtained and reviewed including: Chart review including previous notes, labs, imaging, consultation notes   Lab Tests: -I ordered, reviewed, and interpreted labs.   The pertinent results include:   Labs Reviewed  COMPREHENSIVE METABOLIC PANEL - Abnormal; Notable for the following components:      Result Value   Potassium 3.3 (*)    CO2 18 (*)    Glucose, Bld 278 (*)    Creatinine, Ser 1.21 (*)    GFR, Estimated 53 (*)    Anion gap 17 (*)    All other components within normal limits  CBC WITH DIFFERENTIAL/PLATELET - Abnormal; Notable for the following components:   RBC 5.25 (*)    All other components within normal limits  I-STAT VENOUS BLOOD GAS, ED - Abnormal; Notable for the following components:   pH, Ven 7.509 (*)    pCO2, Ven 24.5 (*)    pO2, Ven 71 (*)    Bicarbonate 19.5 (*)    TCO2 20 (*)    Potassium 3.2 (*)    Calcium, Ion 1.07 (*)    Hemoglobin 15.6 (*)    All other components within normal limits  I-STAT CHEM 8, ED - Abnormal; Notable for the following components:   Potassium 3.2 (*)    Glucose, Bld 285 (*)    Calcium, Ion 1.05 (*)    TCO2 20 (*)    Hemoglobin 16.3 (*)    HCT 48.0  (*)    All other components within normal limits  TROPONIN I (HIGH SENSITIVITY)  TROPONIN I (HIGH SENSITIVITY)      EKG   EKG Interpretation  Date/Time:  Saturday October 25 2021 22:14:15 EDT Ventricular Rate:  91 PR Interval:  144 QRS Duration: 97 QT Interval:  366 QTC Calculation: 451 R Axis:   52 Text Interpretation: Sinus rhythm Consider left atrial enlargement RSR' in V1 or V2, probably normal variant Probable LVH with secondary repol abnrm Confirmed by [place], [place] ([id number]) on 10/25/2021 11:55:40 PM         Imaging Studies ordered: I ordered imaging studies including chest x-ray and dissection study but the scans are pending   Medicines ordered and prescription drug management: Meds ordered this encounter  Medications   lactated ringers bolus 1,000 mL   DISCONTD: diltiazem (CARDIZEM) injection 10 mg   DISCONTD: diltiazem (CARDIZEM) 25 MG/5ML injection    Jimmey Ralph E: cabinet override   ondansetron (ZOFRAN) 4 MG/2ML injection    Michelson, Hassan Rowan R: cabinet override   PHENYLephrine 80 mcg/ml in normal saline Adult IV Push Syringe (For Blood Pressure Support)   etomidate (AMIDATE) injection 10 mg    -I have reviewed the patients home medicines and have made adjustments as needed  Critical interventions Oxygen supplementation, emergency conscious sedation and cardioversion   Cardiac Monitoring: The patient was maintained on a cardiac monitor.  I personally viewed and interpreted the cardiac monitored which showed an underlying rhythm of: A-fib with RVR, normal  sinus rhythm, sinus tachycardia  Social Determinants of Health:  Factors impacting patients care include: History of polysubstance use   Reevaluation: After the interventions noted above, I reevaluated the patient and found that they have :improved  Co morbidities that complicate the patient evaluation  Past Medical History:  Diagnosis Date   Asthma    Degenerative arthritis    "knees"  (03/07/2015)   GERD (gastroesophageal reflux disease)    History of blood transfusion 10/2009   "related to losing too much blood w/my period"   Hypertension    Irregular heart beat    Myocardial infarction ([place])    Obesity    OSA on CPAP    Pneumonia 2008   Tachycardia 03/2012   admitted to hospital   Type II diabetes mellitus ([place])       Dispostion: I considered admission for this patient, and disposition pending completion of CT scans but I do anticipate admission given new oxygen requirement     Final Clinical Impression(s) / ED Diagnoses Final diagnoses:  None     '@PCDICTATION'$ @    Teressa Lower, MD 10/25/21 2357

## 2021-10-26 ENCOUNTER — Emergency Department (HOSPITAL_COMMUNITY): Payer: Medicare Other

## 2021-10-26 ENCOUNTER — Observation Stay (HOSPITAL_BASED_OUTPATIENT_CLINIC_OR_DEPARTMENT_OTHER): Payer: Medicare Other

## 2021-10-26 DIAGNOSIS — R7989 Other specified abnormal findings of blood chemistry: Secondary | ICD-10-CM

## 2021-10-26 DIAGNOSIS — I480 Paroxysmal atrial fibrillation: Secondary | ICD-10-CM | POA: Diagnosis not present

## 2021-10-26 DIAGNOSIS — I4891 Unspecified atrial fibrillation: Secondary | ICD-10-CM

## 2021-10-26 DIAGNOSIS — I5032 Chronic diastolic (congestive) heart failure: Secondary | ICD-10-CM

## 2021-10-26 DIAGNOSIS — R410 Disorientation, unspecified: Secondary | ICD-10-CM | POA: Diagnosis not present

## 2021-10-26 DIAGNOSIS — E876 Hypokalemia: Secondary | ICD-10-CM

## 2021-10-26 DIAGNOSIS — J9601 Acute respiratory failure with hypoxia: Secondary | ICD-10-CM

## 2021-10-26 DIAGNOSIS — K5730 Diverticulosis of large intestine without perforation or abscess without bleeding: Secondary | ICD-10-CM | POA: Diagnosis not present

## 2021-10-26 DIAGNOSIS — R000 Tachycardia, unspecified: Secondary | ICD-10-CM | POA: Diagnosis not present

## 2021-10-26 DIAGNOSIS — R079 Chest pain, unspecified: Secondary | ICD-10-CM | POA: Diagnosis not present

## 2021-10-26 DIAGNOSIS — R112 Nausea with vomiting, unspecified: Secondary | ICD-10-CM

## 2021-10-26 DIAGNOSIS — R0600 Dyspnea, unspecified: Secondary | ICD-10-CM | POA: Diagnosis not present

## 2021-10-26 LAB — BASIC METABOLIC PANEL
Anion gap: 8 (ref 5–15)
BUN: 14 mg/dL (ref 6–20)
CO2: 23 mmol/L (ref 22–32)
Calcium: 8.7 mg/dL — ABNORMAL LOW (ref 8.9–10.3)
Chloride: 106 mmol/L (ref 98–111)
Creatinine, Ser: 0.93 mg/dL (ref 0.44–1.00)
GFR, Estimated: >60 mL/min (ref >60)
Glucose, Bld: 226 mg/dL — ABNORMAL HIGH (ref 70–99)
Potassium: 3.4 mmol/L — ABNORMAL LOW (ref 3.5–5.1)
Sodium: 137 mmol/L (ref 135–145)

## 2021-10-26 LAB — PROCALCITONIN: Procalcitonin: <0.1 ng/mL

## 2021-10-26 LAB — SARS CORONAVIRUS 2 BY RT PCR: SARS Coronavirus 2 by RT PCR: NEGATIVE

## 2021-10-26 LAB — TROPONIN I (HIGH SENSITIVITY)
Troponin I (High Sensitivity): 103 ng/L (ref <18)
Troponin I (High Sensitivity): 110 ng/L (ref <18)
Troponin I (High Sensitivity): 116 ng/L (ref <18)
Troponin I (High Sensitivity): 23 ng/L — ABNORMAL HIGH (ref <18)

## 2021-10-26 LAB — GLUCOSE, CAPILLARY
Glucose-Capillary: 308 mg/dL — ABNORMAL HIGH (ref 70–99)
Glucose-Capillary: 213 mg/dL — ABNORMAL HIGH (ref 70–99)

## 2021-10-26 LAB — RAPID URINE DRUG SCREEN, HOSP PERFORMED
Amphetamines: NOT DETECTED
Barbiturates: NOT DETECTED
Benzodiazepines: NOT DETECTED
Cocaine: POSITIVE — AB
Opiates: NOT DETECTED
Tetrahydrocannabinol: NOT DETECTED

## 2021-10-26 LAB — BETA-HYDROXYBUTYRIC ACID: Beta-Hydroxybutyric Acid: 0.43 mmol/L — ABNORMAL HIGH (ref 0.05–0.27)

## 2021-10-26 LAB — HEMOGLOBIN A1C
Hgb A1c MFr Bld: 10.1 % — ABNORMAL HIGH (ref 4.8–5.6)
Mean Plasma Glucose: 243.17 mg/dL

## 2021-10-26 LAB — BRAIN NATRIURETIC PEPTIDE: B Natriuretic Peptide: 80.2 pg/mL (ref 0.0–100.0)

## 2021-10-26 LAB — MAGNESIUM: Magnesium: 1.9 mg/dL (ref 1.7–2.4)

## 2021-10-26 LAB — ECHOCARDIOGRAM COMPLETE
Area-P 1/2: 3.31 cm2
Height: 65 in
S' Lateral: 2.7 cm
Weight: 3615.54 oz

## 2021-10-26 LAB — ETHANOL: Alcohol, Ethyl (B): <10 mg/dL (ref <10)

## 2021-10-26 LAB — CBG MONITORING, ED
Glucose-Capillary: 232 mg/dL — ABNORMAL HIGH (ref 70–99)
Glucose-Capillary: 198 mg/dL — ABNORMAL HIGH (ref 70–99)
Glucose-Capillary: 211 mg/dL — ABNORMAL HIGH (ref 70–99)

## 2021-10-26 LAB — HIV ANTIBODY (ROUTINE TESTING W REFLEX): HIV Screen 4th Generation wRfx: NONREACTIVE

## 2021-10-26 MED ORDER — ACETAMINOPHEN 325 MG PO TABS: 650.0000 mg | ORAL_TABLET | Freq: Four times a day (QID) | ORAL | Status: DC | PRN

## 2021-10-26 MED ORDER — INSULIN ASPART 100 UNIT/ML IJ SOLN
0.0000 [IU] | INTRAMUSCULAR | Status: DC
  Administered 2021-10-26 (×2): 3 [IU] via SUBCUTANEOUS
  Administered 2021-10-26: 2 [IU] via SUBCUTANEOUS
  Administered 2021-10-26: 7 [IU] via SUBCUTANEOUS
  Administered 2021-10-26: 3 [IU] via SUBCUTANEOUS
  Administered 2021-10-27: 6 [IU] via SUBCUTANEOUS
  Administered 2021-10-27: 1 [IU] via SUBCUTANEOUS
  Administered 2021-10-27: 9 [IU] via SUBCUTANEOUS

## 2021-10-26 MED ORDER — ACETAMINOPHEN 650 MG RE SUPP: 650.0000 mg | Freq: Four times a day (QID) | RECTAL | Status: DC | PRN

## 2021-10-26 MED ORDER — APIXABAN 5 MG PO TABS
5.0000 mg | ORAL_TABLET | Freq: Two times a day (BID) | ORAL | Status: DC
  Administered 2021-10-26 – 2021-10-27 (×3): 5 mg via ORAL
  Filled 2021-10-26 (×3): qty 1

## 2021-10-26 MED ORDER — METOPROLOL SUCCINATE ER 100 MG PO TB24
100.0000 mg | ORAL_TABLET | Freq: Every day | ORAL | Status: DC
  Administered 2021-10-26 – 2021-10-27 (×2): 100 mg via ORAL
  Filled 2021-10-26: qty 4
  Filled 2021-10-26: qty 1

## 2021-10-26 MED ORDER — ALBUTEROL SULFATE HFA 108 (90 BASE) MCG/ACT IN AERS: 1.0000 | INHALATION_SPRAY | Freq: Four times a day (QID) | RESPIRATORY_TRACT | Status: DC | PRN

## 2021-10-26 MED ORDER — INFLUENZA VAC SPLIT QUAD 0.5 ML IM SUSY: 0.5000 mL | PREFILLED_SYRINGE | INTRAMUSCULAR | Status: DC

## 2021-10-26 MED ORDER — POTASSIUM CHLORIDE CRYS ER 20 MEQ PO TBCR
40.0000 meq | EXTENDED_RELEASE_TABLET | Freq: Once | ORAL | Status: AC
  Administered 2021-10-26: 40 meq via ORAL
  Filled 2021-10-26: qty 2

## 2021-10-26 MED ORDER — ROSUVASTATIN CALCIUM 20 MG PO TABS
20.0000 mg | ORAL_TABLET | Freq: Every day | ORAL | Status: DC
  Administered 2021-10-26 – 2021-10-27 (×2): 20 mg via ORAL
  Filled 2021-10-26 (×2): qty 1

## 2021-10-26 MED ORDER — IOHEXOL 350 MG/ML SOLN
100.0000 mL | Freq: Once | INTRAVENOUS | Status: AC | PRN
  Administered 2021-10-26: 100 mL via INTRAVENOUS
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
## 2021-10-26 NOTE — Significant Event (Signed)
Pt Desats to 86 -87 while asleep.
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
## 2021-10-26 NOTE — ED Notes (Signed)
Troponin 110 reported to Dr. Cruzita Lederer at [phone]

## 2021-10-26 NOTE — Plan of Care (Signed)

## 2021-10-27 DIAGNOSIS — I4891 Unspecified atrial fibrillation: Secondary | ICD-10-CM | POA: Diagnosis not present

## 2021-10-27 DIAGNOSIS — I480 Paroxysmal atrial fibrillation: Secondary | ICD-10-CM | POA: Diagnosis not present

## 2021-10-27 LAB — COMPREHENSIVE METABOLIC PANEL
ALT: 21 U/L (ref 0–44)
AST: 23 U/L (ref 15–41)
Albumin: 2.9 g/dL — ABNORMAL LOW (ref 3.5–5.0)
Alkaline Phosphatase: 73 U/L (ref 38–126)
Anion gap: 8 (ref 5–15)
BUN: 19 mg/dL (ref 6–20)
CO2: 23 mmol/L (ref 22–32)
Calcium: 8.5 mg/dL — ABNORMAL LOW (ref 8.9–10.3)
Chloride: 106 mmol/L (ref 98–111)
Creatinine, Ser: 0.93 mg/dL (ref 0.44–1.00)
GFR, Estimated: >60 mL/min (ref >60)
Glucose, Bld: 258 mg/dL — ABNORMAL HIGH (ref 70–99)
Potassium: 3.5 mmol/L (ref 3.5–5.1)
Sodium: 137 mmol/L (ref 135–145)
Total Bilirubin: 0.5 mg/dL (ref 0.3–1.2)
Total Protein: 6 g/dL — ABNORMAL LOW (ref 6.5–8.1)

## 2021-10-27 LAB — GLUCOSE, CAPILLARY
Glucose-Capillary: 131 mg/dL — ABNORMAL HIGH (ref 70–99)
Glucose-Capillary: 229 mg/dL — ABNORMAL HIGH (ref 70–99)
Glucose-Capillary: 259 mg/dL — ABNORMAL HIGH (ref 70–99)
Glucose-Capillary: 486 mg/dL — ABNORMAL HIGH (ref 70–99)

## 2021-10-27 LAB — CBC
HCT: 38.4 % (ref 36.0–46.0)
Hemoglobin: 11.9 g/dL — ABNORMAL LOW (ref 12.0–15.0)
MCH: 27 pg (ref 26.0–34.0)
MCHC: 31 g/dL (ref 30.0–36.0)
MCV: 87.3 fL (ref 80.0–100.0)
Platelets: 199 10*3/uL (ref 150–400)
RBC: 4.4 MIL/uL (ref 3.87–5.11)
RDW: 14.3 % (ref 11.5–15.5)
WBC: 6.1 10*3/uL (ref 4.0–10.5)
nRBC: 0 % (ref 0.0–0.2)

## 2021-10-27 LAB — MAGNESIUM: Magnesium: 1.9 mg/dL (ref 1.7–2.4)

## 2021-10-27 MED ORDER — INSULIN ASPART PROT & ASPART (70-30 MIX) 100 UNIT/ML PEN: 8.0000 [IU] | PEN_INJECTOR | Freq: Two times a day (BID) | SUBCUTANEOUS | 0 refills | Status: DC

## 2021-10-27 MED ORDER — LOSARTAN POTASSIUM-HCTZ 50-12.5 MG PO TABS: 1.0000 | ORAL_TABLET | Freq: Every day | ORAL | 1 refills | Status: DC

## 2021-10-27 MED ORDER — PEN NEEDLES 31G X 5 MM MISC: 1.0000 | Freq: Two times a day (BID) | 1 refills | Status: AC

## 2021-10-27 MED ORDER — INSULIN ASPART 100 UNIT/ML IJ SOLN
10.0000 [IU] | Freq: Once | INTRAMUSCULAR | Status: AC
  Administered 2021-10-27: 10 [IU] via SUBCUTANEOUS

## 2021-10-27 MED ORDER — PANTOPRAZOLE SODIUM 40 MG PO TBEC: 40.0000 mg | DELAYED_RELEASE_TABLET | Freq: Every day | ORAL | 1 refills | Status: DC

## 2021-10-27 MED ORDER — ALBUTEROL SULFATE HFA 108 (90 BASE) MCG/ACT IN AERS: 2.0000 | INHALATION_SPRAY | Freq: Four times a day (QID) | RESPIRATORY_TRACT | 2 refills | Status: DC | PRN

## 2021-10-27 MED ORDER — AMOXICILLIN-POT CLAVULANATE 875-125 MG PO TABS
1.0000 | ORAL_TABLET | Freq: Two times a day (BID) | ORAL | Status: DC
  Administered 2021-10-27: 1 via ORAL
  Filled 2021-10-27: qty 1

## 2021-10-27 MED ORDER — AMOXICILLIN-POT CLAVULANATE 875-125 MG PO TABS: 1.0000 | ORAL_TABLET | Freq: Two times a day (BID) | ORAL | 0 refills | Status: AC

## 2021-10-27 MED ORDER — PREDNISONE 20 MG PO TABS
40.0000 mg | ORAL_TABLET | Freq: Every day | ORAL | Status: DC
  Administered 2021-10-27: 40 mg via ORAL
  Filled 2021-10-27: qty 2

## 2021-10-27 MED ORDER — BLOOD GLUCOSE MONITOR KIT: PACK | 0 refills | Status: AC

## 2021-10-27 MED ORDER — FUROSEMIDE 10 MG/ML IJ SOLN
40.0000 mg | Freq: Once | INTRAMUSCULAR | Status: AC
  Administered 2021-10-27: 40 mg via INTRAVENOUS
  Filled 2021-10-27: qty 4

## 2021-10-27 MED ORDER — ROSUVASTATIN CALCIUM 20 MG PO TABS: 20.0000 mg | ORAL_TABLET | Freq: Every day | ORAL | 1 refills | Status: DC

## 2021-10-27 MED ORDER — APIXABAN 5 MG PO TABS: 5.0000 mg | ORAL_TABLET | Freq: Two times a day (BID) | ORAL | 1 refills | Status: DC

## 2021-10-27 MED ORDER — ALBUTEROL SULFATE (2.5 MG/3ML) 0.083% IN NEBU: 2.5000 mg | INHALATION_SOLUTION | Freq: Four times a day (QID) | RESPIRATORY_TRACT | Status: DC | PRN

## 2021-10-27 MED ORDER — PREDNISONE 20 MG PO TABS: 20.0000 mg | ORAL_TABLET | Freq: Every day | ORAL | 0 refills | Status: DC

## 2021-10-27 MED ORDER — METOPROLOL SUCCINATE ER 100 MG PO TB24: 100.0000 mg | ORAL_TABLET | Freq: Every day | ORAL | 1 refills | Status: DC

## 2021-10-27 MED ORDER — POTASSIUM CHLORIDE CRYS ER 20 MEQ PO TBCR
40.0000 meq | EXTENDED_RELEASE_TABLET | Freq: Once | ORAL | Status: AC
  Administered 2021-10-27: 40 meq via ORAL
  Filled 2021-10-27: qty 2

## 2021-10-27 NOTE — Inpatient Diabetes Management (Addendum)
Inpatient Diabetes Program Recommendations  AACE/ADA: New Consensus Statement on Inpatient Glycemic Control (2015)  Target Ranges:  Prepandial:   less than 140 mg/dL      Peak postprandial:   less than 180 mg/dL (1-2 hours)      Critically ill patients:  140 - 180 mg/dL    Latest Reference Range & Units 10/27/21 00:05 10/27/21 04:05 10/27/21 07:35 10/27/21 10:47  Glucose-Capillary 70 - 99 mg/dL 259 (H) 131 (H) 229 (H) 486 (H)  (H): Data is abnormally high    Met w/ pt at bedside prior to d/c home.  Pt told me she self-stopped Metformin about 1 year ago due to problems with terrible diarrhea.  When she stopped the Metformin she went to [employer] and purchased 70/30 Insulin in the vial with some syringes and began to self-medicate with 30 units BID of the 70/30 Insulin (bought the insulin OTC).  Has been taking this dose of insulin (30 units BID) for about 1 year now.  Has not seen PCP for years and does not desire to establish with PCP but told me she wants to go see Dr. Chalmers Cater with [facility] for diabetes care.  Dr. Cruzita Lederer has placed Dr. Almetta Lovely info on d/c summary and I instructed pt to call Dr. Almetta Lovely office today to make an appt.  Pt also told me she drinks lots of fruit juice and 4 (12 ounce) regular sodas per day.  I strongly urged pt to stop drinking any drinks with Carbohydrates/sugar--I got pt to agree to stop drinking juice but she states she we will wean herself from the regular sodas--2 cans daily for the next 2 weeks--1 can daily for 2weeks after that, and then will stop completely.  I reviewed with pt what 70/30 Insulin in, how it works, when to take.  Reviewed signs/symptoms of hypoglycemia and how to treat.  Spoke with patient about his/her current A1c of  10%.  Explained what an A1c is and what it measures.  Reminded patient that her goal A1c is 7% or less per ADA standards to prevent both acute and long-term complications.  Explained to patient the extreme  importance of good glucose control at home.  Encouraged patient to check her CBGs at least bid at home and to record all CBGs in a logbook for his/her PCP or Endocrinologist to review. We also reviewed her goal A1c and goal CBGs for home.  Pt told me Dr Cruzita Lederer told her to take 8 units BID of the 70/30 insulin when she goes home, however, pt concerned this won't be enough b/c she has been taking 30 units BID.  Sent [place] to Dr. Cruzita Lederer with this info and he told me to tell pt she may take 30 units BID of the 70/30 insulin.  I called into pt's room twice and called her cell phone twice but she did not answer any time I called and tere was not an option to leave a VM.     --Will follow patient during hospitalization--  Wyn Quaker RN, MSN, [place] Diabetes Coordinator Inpatient Glycemic Control Team Team Pager: [phone] (8a-5p)

## 2021-10-27 NOTE — Progress Notes (Signed)
  CBG 486.  MD notified and MD requesting to recheck in 1 hour.  New orders received.    Patient stating her ride is on the way and wants to take the 19 units and leave.   Patient is ready to leave & her ride is on the way.  She states it was the syrup, stating if she was at home she would take 70/30 at 30 units if her sugar was that & 19 units or fast acting would be what she would take if she used that.   MD arrived at bedside & educated patient prior to discharge.  Juice x 1 given for discharge per request.
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
## 2021-10-27 NOTE — Discharge Instructions (Signed)
Follow with Dr Chalmers Cater as soon as possible  Follow up with PCP in 1 week  Please get a complete blood count and chemistry panel checked by your Primary MD at your next visit, and again as instructed by your Primary MD. Please get your medications reviewed and adjusted by your Primary MD.  Please request your Primary MD to go over all Hospital Tests and Procedure/Radiological results at the follow up, please get all Hospital records sent to your Prim MD by signing hospital release before you go home.  In some cases, there will be blood work, cultures and biopsy results pending at the time of your discharge. Please request that your primary care M.D. goes through all the records of your hospital data and follows up on these results.  If you had Pneumonia of Lung problems at the Hospital: Please get a 2 view Chest X ray done in 6-8 weeks after hospital discharge or sooner if instructed by your Primary MD.  If you have Congestive Heart Failure: Please call your Cardiologist or Primary MD anytime you have any of the following symptoms:  1) 3 pound weight gain in 24 hours or 5 pounds in 1 week  2) shortness of breath, with or without a dry hacking cough  3) swelling in the hands, feet or stomach  4) if you have to sleep on extra pillows at night in order to breathe  Follow cardiac low salt diet and 1.5 lit/day fluid restriction.  If you have diabetes Accuchecks 4 times/day, Once in AM empty stomach and then before each meal. Log in all results and show them to your primary doctor at your next visit. If any glucose reading is under 80 or above 300 call your primary MD immediately.  If you have Seizure/Convulsions/Epilepsy: Please do not drive, operate heavy machinery, participate in activities at heights or participate in high speed sports until you have seen by Primary MD or a Neurologist and advised to do so again. Per [facility] statutes, patients with seizures are not allowed to drive  until they have been seizure-free for six months.  Use caution when using heavy equipment or power tools. Avoid working on ladders or at heights. Take showers instead of baths. Ensure the water temperature is not too high on the home water heater. Do not go swimming alone. Do not lock yourself in a room alone (i.e. bathroom). When caring for infants or small children, sit down when holding, feeding, or changing them to minimize risk of injury to the child in the event you have a seizure. Maintain good sleep hygiene. Avoid alcohol.   If you had Gastrointestinal Bleeding: Please ask your Primary MD to check a complete blood count within one week of discharge or at your next visit. Your endoscopic/colonoscopic biopsies that are pending at the time of discharge, will also need to followed by your Primary MD.  Get Medicines reviewed and adjusted. Please take all your medications with you for your next visit with your Primary MD  Please request your Primary MD to go over all hospital tests and procedure/radiological results at the follow up, please ask your Primary MD to get all Hospital records sent to his/her office.  If you experience worsening of your admission symptoms, develop shortness of breath, life threatening emergency, suicidal or homicidal thoughts you must seek medical attention immediately by calling 911 or calling your MD immediately  if symptoms less severe.  You must read complete instructions/literature along with all the possible adverse  reactions/side effects for all the Medicines you take and that have been prescribed to you. Take any new Medicines after you have completely understood and accpet all the possible adverse reactions/side effects.   Do not drive or operate heavy machinery when taking Pain medications.   Do not take more than prescribed Pain, Sleep and Anxiety Medications  Special Instructions: If you have smoked or chewed Tobacco  in the last 2 yrs please stop smoking,  stop any regular Alcohol  and or any Recreational drug use.  Wear Seat belts while driving.  Please note You were cared for by a hospitalist during your hospital stay. If you have any questions about your discharge medications or the care you received while you were in the hospital after you are discharged, you can call the unit and asked to speak with the hospitalist on call if the hospitalist that took care of you is not available. Once you are discharged, your primary care physician will handle any further medical issues. Please note that NO REFILLS for any discharge medications will be authorized once you are discharged, as it is imperative that you return to your primary care physician (or establish a relationship with a primary care physician if you do not have one) for your aftercare needs so that they can reassess your need for medications and monitor your lab values.  You can reach the hospitalist office at phone [phone] or fax [phone]   If you do not have a primary care physician, you can call [phone] for a physician referral.  Activity: As tolerated with Full fall precautions use walker/cane & assistance as needed    Diet: diabetic  Disposition Home

## 2021-10-27 NOTE — Care Management Obs Status (Signed)
[facility] NOTIFICATION   Patient Details  Name: Rebecca Malone MRN: [id number] Date of Birth: 1965-11-11   Medicare Observation Status Notification Given:  Yes    Zenon Mayo, RN 10/27/2021, 9:56 AM

## 2021-10-27 NOTE — Consult Note (Signed)
   [facility] CM Inpatient Consult   10/27/2021  Cedar BRESHAY ILG Mar 13, 1965 [id number]  [facility] Referral:  showing on [facility] Provider: Pcp, No  Insurance:  [employer]   The patient is not listed with a Quarry manager in the [employer].  Met with the patient at the bedside to elicit information regarding her primary care.  Patient states, "I have no need for a PCP, as I have specialist that takes care of my issues.  The PCP is just a middle man that wants a co-pay so I go straight to the source."  Explained the reason for PCP as the gate keeper for managing care her care needs.  Patient states, "no ma'am I don't want one [PCP] and I will call my insurance again."     For additional questions or referrals please contact:    For questions, please call:  Natividad Brood, RN BSN [place]  [phone] business mobile phone Toll free office [phone]  *[place]  [phone] Fax number: [phone] [email or web address]

## 2021-10-27 NOTE — TOC Progression Note (Signed)
Transition of Care [facility]) - Progression Note    Patient Details  Name: Rebecca Malone MRN: [id number] Date of Birth: [birth year]  Transition of Care [facility]) CM/SW Contact  Zenon Mayo, RN Phone Number: 10/27/2021, 10:00 AM  Clinical Narrative:    From home alone, indep, afib with RVR , cardioversion done, for poss dc today.  NCM spoke with patient at the bedside, she does not have a PCP.  NCM offered to set her up with a [facility], she states she does not want a PCP , because all they will do is send her to a Cardiologist or send her to a specialist for what ever her problem is.  She states if she gets sick she will go to urgent care.  She states she has transportation home at dc.          Expected Discharge Plan and Services           Expected Discharge Date: 10/27/21                                     Social Determinants of Health (SDOH) Interventions    Readmission Risk Interventions     No data to display

## 2021-10-27 NOTE — TOC Transition Note (Addendum)
Transition of Care [facility]) - CM/SW Discharge Note   Patient Details  Name: Rebecca Malone MRN: [id number] Date of Birth: 12-03-1965  Transition of Care [facility]) CM/SW Contact:  Zenon Mayo, RN Phone Number: 10/27/2021, 10:11 AM   Clinical Narrative:    Patient is for dc today. She has no needs. NCM gave her the SA resources,  she  states she does not use salt in her diet , except she does use low sodium soy sauce sometimes.  She states she eats a lot of vegetables.           Patient Goals and CMS Choice        Discharge Placement                       Discharge Plan and Services                                     Social Determinants of Health (SDOH) Interventions     Readmission Risk Interventions     No data to display

## 2021-10-29 ENCOUNTER — Telehealth: Payer: Self-pay | Admitting: Internal Medicine

## 2021-10-29 DIAGNOSIS — E1142 Type 2 diabetes mellitus with diabetic polyneuropathy: Secondary | ICD-10-CM

## 2021-10-29 NOTE — Telephone Encounter (Signed)
HOSPITAL MEDICINE TELEPHONE ENCOUNTER NOTE  Notified that outpatient pharmacy has alerted [place] that patient's insurance has not authorized the filling of patient's prescription for NovoLog 70/30 and instead are advising that the patient's prescription be exchanged for a NovoLog 75/25 mix.  Chart reviewed, I have given approval for this exchange.  Patient will have prescription for NovoLog 75/25 mix filled at 8 units twice daily.  Vernelle Emerald MD [facility]

## 2021-10-30 ENCOUNTER — Encounter: Payer: Self-pay | Admitting: Nurse Practitioner

## 2021-10-30 ENCOUNTER — Ambulatory Visit: Payer: Medicare Other | Attending: Nurse Practitioner | Admitting: Nurse Practitioner

## 2021-10-30 ENCOUNTER — Telehealth: Payer: Self-pay | Admitting: *Deleted

## 2021-10-30 VITALS — BP 160/84 | HR 54 | Ht 65.0 in | Wt 239.0 lb

## 2021-10-30 DIAGNOSIS — E119 Type 2 diabetes mellitus without complications: Secondary | ICD-10-CM

## 2021-10-30 DIAGNOSIS — I480 Paroxysmal atrial fibrillation: Secondary | ICD-10-CM | POA: Diagnosis not present

## 2021-10-30 DIAGNOSIS — G4733 Obstructive sleep apnea (adult) (pediatric): Secondary | ICD-10-CM | POA: Diagnosis not present

## 2021-10-30 DIAGNOSIS — I10 Essential (primary) hypertension: Secondary | ICD-10-CM

## 2021-10-30 DIAGNOSIS — I5032 Chronic diastolic (congestive) heart failure: Secondary | ICD-10-CM | POA: Diagnosis not present

## 2021-10-30 DIAGNOSIS — F1410 Cocaine abuse, uncomplicated: Secondary | ICD-10-CM

## 2021-10-30 NOTE — Patient Instructions (Addendum)
Medication Instructions:  Your physician recommends that you continue on your current medications as directed. Please refer to the Current Medication list given to you today.   *If you need a refill on your cardiac medications before your next appointment, please call your pharmacy*   Lab Work: NONE ordered at this time of appointment   If you have labs (blood work) drawn today and your tests are completely normal, you will receive your results only by: [facility] (if you have MyChart) OR A paper copy in the mail If you have any lab test that is abnormal or we need to change your treatment, we will call you to review the results.   Testing/Procedures: NONE ordered at this time of appointment     Follow-Up: At [facility], you and your health needs are our priority.  As part of our continuing mission to provide you with exceptional heart care, we have created designated Provider Care Teams.  These Care Teams include your primary Cardiologist (physician) and Advanced Practice Providers (APPs -  Physician Assistants and Nurse Practitioners) who all work together to provide you with the care you need, when you need it.  We recommend signing up for the patient portal called "MyChart".  Sign up information is provided on this After Visit Summary.  MyChart is used to connect with patients for Virtual Visits (Telemedicine).  Patients are able to view lab/test results, encounter notes, upcoming appointments, etc.  Non-urgent messages can be sent to your provider as well.   To learn more about what you can do with MyChart, go to [email or web address].    Your next appointment:   3-4 month(s) Next Available For Sleep APT (Dr. Radford Pax) The format for your next appointment:   In Person  Provider:   Sanda Klein, MD     Other Instructions Monitor Blood pressure. Goal is 130/80. Take blood pressure 2 hours after taking medications (remain seated 5-10 mins before taking).    Jacolyn Reedy [facility] @ [phone] Important Information About Sugar

## 2021-10-30 NOTE — Progress Notes (Signed)
Office Visit    Patient Name: Rebecca Malone Date of Encounter: 10/30/2021  Primary Care Provider:  Pcp, No Primary Cardiologist:  Sanda Klein, MD  Chief Complaint    56 year old female with a history of paroxysmal atrial fibrillation, recurrent palpitations, chronic diastolic heart failure, hypertension, type 2 diabetes, OSA, cocaine use, and obesity who presents for hospital follow-up related to atrial fibrillation.  Past Medical History    Past Medical History:  Diagnosis Date   Asthma    Degenerative arthritis    "knees" (03/07/2015)   GERD (gastroesophageal reflux disease)    History of blood transfusion 10/2009   "related to losing too much blood w/my period"   Hypertension    Irregular heart beat    Myocardial infarction ([place])    Obesity    OSA on CPAP    Pneumonia 2008   Tachycardia 03/2012   admitted to hospital   Type II diabetes mellitus [facility])    Past Surgical History:  Procedure Laterality Date   CESAREAN SECTION  1999    Allergies  Allergies  Allergen Reactions   Advair Diskus [Fluticasone-Salmeterol] Other (See Comments)    Made sores on the back of her throat   Other Hives and Itching    Patient states she is allergic to all generic medications. Can only have [facility] MEDS ONLY   Pork-Derived Products     Religious     History of Present Illness    56 year old female with the above past medical history including paroxysmal atrial fibrillation, recurrent palpitations, chronic diastolic heart failure, hypertension, type 2 diabetes, OSA, cocaine use, and obesity.  Prior cardiac event monitor in August 2019 did not show any significant arrhythmia.  She was later diagnosed with atrial fibrillation during hospitalization in April 2021.  UDS at the time was positive for cocaine.  She was started she also noted confusion surrounding her medications.  BP was elevated and she was transition from lisinopril to losartan.  Adherence to CPAP therapy was  advised.  On Eliquis.  Echocardiogram in April 2021 showed LVH with hyperdynamic LV systolic function and intracavitary "gradient" and G2 DD.  Nuclear stress test in 2014 was negative for ischemia.  She was last seen in the office on 09/12/2020 and was stable overall from a cardiac standpoint.  She did note recurrent palpitations.  Did to the ED on 10/25/2021 with atrial fibrillation with RVR, HR in the 150s, hypoxia, diaphoresis, and hypotension. UDS was positive for cocaine.  CT of the chest was negative for PE but showed moderate to severe posterior left upper lobe and bilateral lower lobe atelectasis and/or infiltrate.  CT of the head was negative for acute finding.  She reported having stopped taking her home medications.  She denied cocaine use at the time and reported someone may have "mixed it into" her food.  Underwent emergent DCCV with restoration of NSR.  She was restarted on her home metoprolol and Eliquis.  She was treated with prednisone and Augmentin for suspected aspiration pneumonia.  Adherence to medication regimen was advised.  Additionally, she was referred to endocrinology for poorly controlled type 2 diabetes.  She was discharged home in stable condition on 03/19/2021.  She presents today for follow-up. Since her recent hospitalization she has been stable from a cardiac standpoint.  She is tearful upon me entering the room.  She states that she is very scared and is afraid she might die due to her heart problems.  She reports adherence to her  medication regimen since discharge.  She denies any recent cocaine use.  She note intermittent fleeting palpitations, she denies any dizziness, presyncope, syncope, chest pain, or dyspnea.  She is asking to be referred to endocrinology for management of her diabetes and states she is not interested in seeing a PCP for this.  Of note, patient does not have a PCP.  Her BP is elevated.  Other than her stress related to her multiple medical conditions, she  denies any additional concerns today.  Home Medications    Current Outpatient Medications  Medication Sig Dispense Refill   albuterol (VENTOLIN HFA) 108 (90 Base) MCG/ACT inhaler Inhale 2 puffs into the lungs every 6 (six) hours as needed for wheezing or shortness of breath. 8 g 2   amoxicillin-clavulanate (AUGMENTIN) 875-125 MG tablet Take 1 tablet by mouth every 12 (twelve) hours for 5 days. 10 tablet 0   apixaban (ELIQUIS) 5 MG TABS tablet Take 1 tablet (5 mg total) by mouth 2 (two) times daily. 60 tablet 1   blood glucose meter kit and supplies KIT Dispense based on patient and insurance preference. Use up to four times daily as directed. 1 each 0   insulin aspart protamine - aspart (NOVOLOG 70/30 MIX) (70-30) 100 UNIT/ML FlexPen Inject 8 Units into the skin 2 (two) times daily with a meal. Can dispense generic (Patient taking differently: Inject 30 Units into the skin 2 (two) times daily with a meal. Can dispense generic) 15 mL 0   Insulin Pen Needle (PEN NEEDLES) 31G X 5 MM MISC 1 each by Does not apply route in the morning and at bedtime. 60 each 1   losartan-hydrochlorothiazide (HYZAAR) 50-12.5 MG tablet Take 1 tablet by mouth daily. 30 tablet 1   metoprolol succinate (TOPROL-XL) 100 MG 24 hr tablet Take 1 tablet (100 mg total) by mouth daily. Take with or immediately following a meal. 30 tablet 1   pantoprazole (PROTONIX) 40 MG tablet Take 1 tablet (40 mg total) by mouth daily. Take 30-60 min before first meal of the day 30 tablet 1   predniSONE (DELTASONE) 20 MG tablet Take 1 tablet (20 mg total) by mouth daily with breakfast. 3 tablet 0   rosuvastatin (CRESTOR) 20 MG tablet Take 1 tablet (20 mg total) by mouth daily. 30 tablet 1   No current facility-administered medications for this visit.     Review of Systems    She denies chest pain, dyspnea, pnd, orthopnea, n, v, dizziness, syncope, edema, weight gain, or early satiety. All other systems reviewed and are otherwise negative  except as noted above.  Physical Exam    VS:  BP (!) 160/84   Pulse (!) 54   Ht _0  (1.651 m)   Wt 239 lb (108.4 kg)   LMP 09/30/2012   SpO2 97%   BMI 39.77 kg/m  GEN: Well nourished, well developed, in no acute distress. HEENT: normal. Neck: Supple, no JVD, carotid bruits, or masses. Cardiac: RRR, no murmurs, rubs, or gallops. No clubbing, cyanosis, edema.  Radials/DP/PT 2+ and equal bilaterally.  Respiratory:  Respirations regular and unlabored, clear to auscultation bilaterally. GI: Soft, nontender, nondistended, BS + x 4. MS: no deformity or atrophy. Skin: warm and dry, no rash. Neuro:  Strength and sensation are intact. Psych: Normal affect.  Accessory Clinical Findings    ECG personally reviewed by me today -sinus bradycardia, 54 bpm- no acute changes.   Lab Results  Component Value Date   WBC 6.1 10/27/2021   HGB  11.9 (L) 10/27/2021   HCT 38.4 10/27/2021   MCV 87.3 10/27/2021   PLT 199 10/27/2021   Lab Results  Component Value Date   CREATININE 0.93 10/27/2021   BUN 19 10/27/2021   NA 137 10/27/2021   K 3.5 10/27/2021   CL 106 10/27/2021   CO2 23 10/27/2021   Lab Results  Component Value Date   ALT 21 10/27/2021   AST 23 10/27/2021   ALKPHOS 73 10/27/2021   BILITOT 0.5 10/27/2021   Lab Results  Component Value Date   CHOL 172 05/24/2019   HDL 41 05/24/2019   LDLCALC 109 (H) 05/24/2019   TRIG 108 05/24/2019   CHOLHDL 4.2 05/24/2019    Lab Results  Component Value Date   HGBA1C 10.1 (H) 10/26/2021    Assessment & Plan    1. Paroxysmal atrial fibrillation/palpitation: Recent ED visit with A-fib RVR and decompensation in the setting of cocaine use, possible aspiration pneumonia, and medication nonadherence.  S/p emergent DCCV.  Maintaining NSR. Discussed importance of adherence to medication regimen.  Discussed risk of concurrent use of metoprolol and cocaine.  Patient verbalized understanding.  Continue metoprolol, Eliquis.  2. Chronic  diastolic heart failure: Echo in April 2021 showed LVH with hyperdynamic LV systolic function and intracavitary "gradient" and G2 DD. Euvolemic and well compensated on exam.  Continue metoprolol, Hyzaar.  3. Hypertension: BP elevated above goal in office today.  She has not taken her medications today.  Additionally, she is tearful and upset during our conversation today.  Continue to monitor BP and report BP consistently > 130/80.  For now, continue current antihypertensive regimen.   4. Type 2 diabetes: A1c was 10.1 on 10/26/2021.  She does not have a PCP and has requested to be referred to endocrinology for diabetes management.  We discussed the importance of having a PCP.  I have provided her the names of several PCPs in the area.  She states she will contact a PCP to set up an appointment.  5. OSA: She reports nonadherence to her CPAP due to the fact that her mask makes her feel claustrophobic.  Previously followed with Dr. Claiborne Billings.  Discussed with our sleep coordinator, will refer to Dr. Radford Pax for further evaluation per patient preference.  Discussed the importance of management of sleep apnea in relation to atrial fibrillation.  Patient verbalized understanding.  6. Cocaine use: Full cessation advised.   7. Disposition: Follow-up in 3 to 4 months with Dr. Sallyanne Kuster.  Follow-up in sleep clinic.  HYPERTENSION CONTROL Vitals:   10/30/21 1040 10/30/21 1115  BP: (!) 182/90 (!) 160/84    The patient's blood pressure is elevated above target today.  In order to address the patient's elevated BP: Blood pressure will be monitored at home to determine if medication changes need to be made.; Follow up with general cardiology has been recommended.; Follow up with primary care provider for management.      Lenna Sciara, NP 10/30/2021, 12:49 PM

## 2021-10-30 NOTE — Telephone Encounter (Signed)
Patient was seen in the office today by Diona Browner, NP and mentioned that she stopped using her CPAP device due to her mask. She states that the mask covered her entire face so she stopped using it. Patient was given a F-30i mask sample by me today to try until she can be seen by sleep doctor. She plans to see Dr Radford Pax, switching from Dr Claiborne Billings.

## 2021-11-10 ENCOUNTER — Encounter (HOSPITAL_BASED_OUTPATIENT_CLINIC_OR_DEPARTMENT_OTHER): Payer: Self-pay

## 2021-11-10 ENCOUNTER — Emergency Department (HOSPITAL_BASED_OUTPATIENT_CLINIC_OR_DEPARTMENT_OTHER): Payer: Medicare Other

## 2021-11-10 ENCOUNTER — Other Ambulatory Visit: Payer: Self-pay

## 2021-11-10 ENCOUNTER — Emergency Department (HOSPITAL_BASED_OUTPATIENT_CLINIC_OR_DEPARTMENT_OTHER)
Admission: EM | Admit: 2021-11-10 | Discharge: 2021-11-10 | Disposition: A | Discharge status: Home / Self Care | Payer: Medicare Other | Attending: Emergency Medicine | Admitting: Emergency Medicine

## 2021-11-10 DIAGNOSIS — I509 Heart failure, unspecified: Secondary | ICD-10-CM

## 2021-11-10 DIAGNOSIS — J45909 Unspecified asthma, uncomplicated: Secondary | ICD-10-CM

## 2021-11-10 DIAGNOSIS — R0602 Shortness of breath: Secondary | ICD-10-CM | POA: Diagnosis not present

## 2021-11-10 DIAGNOSIS — I110 Hypertensive heart disease with heart failure: Secondary | ICD-10-CM | POA: Diagnosis not present

## 2021-11-10 DIAGNOSIS — R079 Chest pain, unspecified: Secondary | ICD-10-CM | POA: Diagnosis not present

## 2021-11-10 DIAGNOSIS — Z79899 Other long term (current) drug therapy: Secondary | ICD-10-CM | POA: Insufficient documentation

## 2021-11-10 DIAGNOSIS — Z794 Long term (current) use of insulin: Secondary | ICD-10-CM | POA: Insufficient documentation

## 2021-11-10 DIAGNOSIS — Z7901 Long term (current) use of anticoagulants: Secondary | ICD-10-CM | POA: Insufficient documentation

## 2021-11-10 LAB — URINALYSIS, ROUTINE W REFLEX MICROSCOPIC
Bilirubin Urine: NEGATIVE
Glucose, UA: >1000 mg/dL — AB
Hgb urine dipstick: NEGATIVE
Ketones, ur: NEGATIVE mg/dL
Leukocytes,Ua: NEGATIVE
Nitrite: NEGATIVE
Protein, ur: NEGATIVE mg/dL
Specific Gravity, Urine: 1.014 (ref 1.005–1.030)
pH: 6 (ref 5.0–8.0)

## 2021-11-10 LAB — CBC
HCT: 38 % (ref 36.0–46.0)
Hemoglobin: 11.8 g/dL — ABNORMAL LOW (ref 12.0–15.0)
MCH: 27.1 pg (ref 26.0–34.0)
MCHC: 31.1 g/dL (ref 30.0–36.0)
MCV: 87.2 fL (ref 80.0–100.0)
Platelets: 210 10*3/uL (ref 150–400)
RBC: 4.36 MIL/uL (ref 3.87–5.11)
RDW: 14.3 % (ref 11.5–15.5)
WBC: 6 10*3/uL (ref 4.0–10.5)
nRBC: 0 % (ref 0.0–0.2)

## 2021-11-10 LAB — BASIC METABOLIC PANEL
Anion gap: 9 (ref 5–15)
BUN: 11 mg/dL (ref 6–20)
CO2: 26 mmol/L (ref 22–32)
Calcium: 9.1 mg/dL (ref 8.9–10.3)
Chloride: 101 mmol/L (ref 98–111)
Creatinine, Ser: 0.94 mg/dL (ref 0.44–1.00)
GFR, Estimated: >60 mL/min (ref >60)
Glucose, Bld: 423 mg/dL — ABNORMAL HIGH (ref 70–99)
Potassium: 3.3 mmol/L — ABNORMAL LOW (ref 3.5–5.1)
Sodium: 136 mmol/L (ref 135–145)

## 2021-11-10 LAB — RAPID URINE DRUG SCREEN, HOSP PERFORMED
Amphetamines: NOT DETECTED
Barbiturates: NOT DETECTED
Benzodiazepines: NOT DETECTED
Cocaine: POSITIVE — AB
Opiates: NOT DETECTED
Tetrahydrocannabinol: NOT DETECTED

## 2021-11-10 LAB — BRAIN NATRIURETIC PEPTIDE: B Natriuretic Peptide: 363.5 pg/mL — ABNORMAL HIGH (ref 0.0–100.0)

## 2021-11-10 LAB — TROPONIN I (HIGH SENSITIVITY)
Troponin I (High Sensitivity): 7 ng/L (ref <18)
Troponin I (High Sensitivity): 7 ng/L (ref <18)

## 2021-11-10 LAB — CBG MONITORING, ED: Glucose-Capillary: 416 mg/dL — ABNORMAL HIGH (ref 70–99)

## 2021-11-10 MED ORDER — FUROSEMIDE 10 MG/ML IJ SOLN
40.0000 mg | Freq: Once | INTRAMUSCULAR | Status: AC
  Administered 2021-11-10: 40 mg via INTRAVENOUS
  Filled 2021-11-10: qty 4

## 2021-11-10 MED ORDER — FUROSEMIDE 20 MG PO TABS: 20.0000 mg | ORAL_TABLET | Freq: Every day | ORAL | 2 refills | Status: DC

## 2021-11-10 MED ORDER — IPRATROPIUM-ALBUTEROL 0.5-2.5 (3) MG/3ML IN SOLN
3.0000 mL | Freq: Once | RESPIRATORY_TRACT | Status: AC
  Administered 2021-11-10: 3 mL via RESPIRATORY_TRACT
  Filled 2021-11-10: qty 3

## 2021-11-10 MED ORDER — ALBUTEROL SULFATE (2.5 MG/3ML) 0.083% IN NEBU: 2.5000 mg | INHALATION_SOLUTION | RESPIRATORY_TRACT | 2 refills | Status: AC | PRN

## 2021-11-10 MED ORDER — ALBUTEROL SULFATE (2.5 MG/3ML) 0.083% IN NEBU: 2.5000 mg | INHALATION_SOLUTION | RESPIRATORY_TRACT | 2 refills | Status: DC | PRN

## 2021-11-10 NOTE — ED Notes (Signed)
EDP aware pt BP elevated, spoke to pt who reports that she has not yet taken her BP medication, will take hers at home to address this

## 2021-11-10 NOTE — Discharge Instructions (Addendum)
See your Physician for recheck.  Return if any problems.  °

## 2021-11-10 NOTE — ED Triage Notes (Signed)
Patient here POV from Home.  Endorses SOB and CP that began at 0200. Recently in Hospital for Heart Failure.   NAD Noted during Triage. A&Ox4. Gcs 15. Ambulatory.

## 2021-11-10 NOTE — ED Notes (Signed)
Patient has been ambulating to restroom and urinating without alerting staff.  Unable to measure I/O.

## 2021-11-10 NOTE — ED Provider Notes (Signed)
[facility] DEPT Provider Note   CSN: [id number] Arrival date & time: 11/10/21  1615     History  Chief Complaint  Patient presents with   Shortness of Breath    Rebecca Malone is a 56 y.o. female.  Pt complains of shortness of breath.  Pt has been using albuterol at home without  relief   The history is provided by the patient. No language interpreter was used.  Shortness of Breath Severity:  Moderate Onset quality:  Gradual Duration:  12 hours Timing:  Constant Progression:  Worsening Chronicity:  New Relieved by:  Nothing Worsened by:  Nothing Ineffective treatments:  Diuretics Associated symptoms: diaphoresis        Home Medications Prior to Admission medications   Medication Sig Start Date End Date Taking? Authorizing Provider  albuterol (VENTOLIN HFA) 108 (90 Base) MCG/ACT inhaler Inhale 2 puffs into the lungs every 6 (six) hours as needed for wheezing or shortness of breath. 10/27/21   Caren Griffins, MD  apixaban (ELIQUIS) 5 MG TABS tablet Take 1 tablet (5 mg total) by mouth 2 (two) times daily. 10/27/21 01/25/22  Caren Griffins, MD  blood glucose meter kit and supplies KIT Dispense based on patient and insurance preference. Use up to four times daily as directed. 10/27/21   Caren Griffins, MD  insulin aspart protamine - aspart (NOVOLOG 70/30 MIX) (70-30) 100 UNIT/ML FlexPen Inject 8 Units into the skin 2 (two) times daily with a meal. Can dispense generic Patient taking differently: Inject 30 Units into the skin 2 (two) times daily with a meal. Can dispense generic 10/27/21   Caren Griffins, MD  Insulin Pen Needle (PEN NEEDLES) 31G X 5 MM MISC 1 each by Does not apply route in the morning and at bedtime. 10/27/21   Caren Griffins, MD  losartan-hydrochlorothiazide (HYZAAR) 50-12.5 MG tablet Take 1 tablet by mouth daily. 10/27/21   Caren Griffins, MD  metoprolol succinate (TOPROL-XL) 100 MG 24 hr tablet Take 1 tablet (100 mg  total) by mouth daily. Take with or immediately following a meal. 10/27/21   Gherghe, Vella Redhead, MD  pantoprazole (PROTONIX) 40 MG tablet Take 1 tablet (40 mg total) by mouth daily. Take 30-60 min before first meal of the day 10/27/21   Caren Griffins, MD  predniSONE (DELTASONE) 20 MG tablet Take 1 tablet (20 mg total) by mouth daily with breakfast. 10/28/21   Caren Griffins, MD  rosuvastatin (CRESTOR) 20 MG tablet Take 1 tablet (20 mg total) by mouth daily. 10/27/21 01/25/22  Caren Griffins, MD      Allergies    Advair diskus [fluticasone-salmeterol], Other, and Pork-derived products    Review of Systems   Review of Systems  Constitutional:  Positive for diaphoresis.  Respiratory:  Positive for shortness of breath.   All other systems reviewed and are negative.   Physical Exam Updated Vital Signs BP (!) 169/60 (BP Location: Right Arm)   Pulse 77   Temp 97.8 F (36.6 C)   Resp (!) 30   Ht '5\' 5"'  (1.651 m)   Wt 108.4 kg   LMP 09/30/2012   SpO2 100%   BMI 39.77 kg/m  Physical Exam Vitals and nursing note reviewed.  Constitutional:      Appearance: She is well-developed.  HENT:     Head: Normocephalic.  Cardiovascular:     Rate and Rhythm: Normal rate.  Pulmonary:     Effort: Pulmonary effort is normal.  Breath sounds: Decreased breath sounds and wheezing present.  Abdominal:     General: There is no distension.     Palpations: Abdomen is soft.  Musculoskeletal:        General: Normal range of motion.     Cervical back: Normal range of motion.  Skin:    General: Skin is warm.  Neurological:     General: No focal deficit present.     Mental Status: She is alert and oriented to person, place, and time.  Psychiatric:        Mood and Affect: Mood normal.     ED Results / Procedures / Treatments   Labs (all labs ordered are listed, but only abnormal results are displayed) Labs Reviewed  CBC - Abnormal; Notable for the following components:      Result Value    Hemoglobin 11.8 (*)    All other components within normal limits  BASIC METABOLIC PANEL  BRAIN NATRIURETIC PEPTIDE  URINALYSIS, ROUTINE W REFLEX MICROSCOPIC  RAPID URINE DRUG SCREEN, HOSP PERFORMED  TROPONIN I (HIGH SENSITIVITY)    EKG None  Radiology No results found.  Procedures Procedures    Medications Ordered in ED Medications  ipratropium-albuterol (DUONEB) 0.5-2.5 (3) MG/3ML nebulizer solution 3 mL (has no administration in time range)    ED Course/ Medical Decision Making/ A&P                           Medical Decision Making Pt complains of shortness of breath.  Pt has a history of asthma.  Pt reports she also has a history of CHF.  Pt reports no relief with albuterol.    Amount and/or Complexity of Data Reviewed External Data Reviewed: notes.    Details: Hospitalist and cardiology notes reviewed   Labs: ordered. Decision-making details documented in ED Course.    Details: Labs ordered reviewed and interpreted.  Troponin is negative x 2  bnp is 365.  UDs positive for cocaine Radiology: ordered and independent interpretation performed. Decision-making details documented in ED Course. ECG/medicine tests: ordered and independent interpretation performed. Decision-making details documented in ED Course.    Details: No acute changes   Risk Prescription drug management. Risk Details: Pt given a duoneb and lasix 47m.  Pt had 2 troponins which are negative.  Pt reports she feels much better.  Pt has not taken her insulin or her blood pressure medications.  Pt wants to go home.  Pt given rx for lasix and albuterol.             Final Clinical Impression(s) / ED Diagnoses Final diagnoses:  Acute congestive heart failure, unspecified heart failure type ([facility]  Moderate asthma without complication, unspecified whether persistent    Rx / DC Orders ED Discharge Orders          Ordered    albuterol (PROVENTIL) (2.5 MG/3ML) 0.083% nebulizer solution  Every 4  hours PRN,   Status:  Discontinued        11/10/21 2138    furosemide (LASIX) 20 MG tablet  Daily,   Status:  Discontinued        11/10/21 2138    albuterol (PROVENTIL) (2.5 MG/3ML) 0.083% nebulizer solution  Every 4 hours PRN        11/10/21 2144    furosemide (LASIX) 20 MG tablet  Daily        11/10/21 2144           An After  Visit Summary was printed and given to the patient.    Fransico Meadow, PA-C 54/56/25 [id number]    Lianne Cure, DO 37/34/28 [id number]

## 2022-01-06 ENCOUNTER — Emergency Department (HOSPITAL_BASED_OUTPATIENT_CLINIC_OR_DEPARTMENT_OTHER): Payer: Medicare Other | Admitting: Radiology

## 2022-01-06 ENCOUNTER — Encounter (HOSPITAL_BASED_OUTPATIENT_CLINIC_OR_DEPARTMENT_OTHER): Payer: Self-pay | Admitting: Emergency Medicine

## 2022-01-06 ENCOUNTER — Emergency Department (HOSPITAL_BASED_OUTPATIENT_CLINIC_OR_DEPARTMENT_OTHER)
Admission: EM | Admit: 2022-01-06 | Discharge: 2022-01-07 | Disposition: A | Discharge status: Home / Self Care | Payer: Medicare Other | Attending: Emergency Medicine | Admitting: Emergency Medicine

## 2022-01-06 ENCOUNTER — Other Ambulatory Visit: Payer: Self-pay

## 2022-01-06 DIAGNOSIS — R059 Cough, unspecified: Secondary | ICD-10-CM | POA: Insufficient documentation

## 2022-01-06 DIAGNOSIS — J209 Acute bronchitis, unspecified: Secondary | ICD-10-CM | POA: Diagnosis not present

## 2022-01-06 DIAGNOSIS — L02214 Cutaneous abscess of groin: Secondary | ICD-10-CM | POA: Diagnosis not present

## 2022-01-06 DIAGNOSIS — Z79899 Other long term (current) drug therapy: Secondary | ICD-10-CM | POA: Insufficient documentation

## 2022-01-06 DIAGNOSIS — E119 Type 2 diabetes mellitus without complications: Secondary | ICD-10-CM | POA: Insufficient documentation

## 2022-01-06 DIAGNOSIS — Z7901 Long term (current) use of anticoagulants: Secondary | ICD-10-CM | POA: Insufficient documentation

## 2022-01-06 DIAGNOSIS — I10 Essential (primary) hypertension: Secondary | ICD-10-CM | POA: Diagnosis not present

## 2022-01-06 DIAGNOSIS — Z794 Long term (current) use of insulin: Secondary | ICD-10-CM | POA: Diagnosis not present

## 2022-01-06 DIAGNOSIS — Z20822 Contact with and (suspected) exposure to covid-19: Secondary | ICD-10-CM | POA: Insufficient documentation

## 2022-01-06 DIAGNOSIS — R739 Hyperglycemia, unspecified: Secondary | ICD-10-CM

## 2022-01-06 DIAGNOSIS — R0602 Shortness of breath: Secondary | ICD-10-CM | POA: Insufficient documentation

## 2022-01-06 DIAGNOSIS — R079 Chest pain, unspecified: Secondary | ICD-10-CM | POA: Diagnosis not present

## 2022-01-06 DIAGNOSIS — J45909 Unspecified asthma, uncomplicated: Secondary | ICD-10-CM | POA: Insufficient documentation

## 2022-01-06 DIAGNOSIS — E1165 Type 2 diabetes mellitus with hyperglycemia: Secondary | ICD-10-CM | POA: Diagnosis not present

## 2022-01-06 LAB — CBC
HCT: 40.7 % (ref 36.0–46.0)
Hemoglobin: 13.1 g/dL (ref 12.0–15.0)
MCH: 26.6 pg (ref 26.0–34.0)
MCHC: 32.2 g/dL (ref 30.0–36.0)
MCV: 82.6 fL (ref 80.0–100.0)
Platelets: 195 10*3/uL (ref 150–400)
RBC: 4.93 MIL/uL (ref 3.87–5.11)
RDW: 13.6 % (ref 11.5–15.5)
WBC: 12 10*3/uL — ABNORMAL HIGH (ref 4.0–10.5)
nRBC: 0 % (ref 0.0–0.2)

## 2022-01-06 LAB — BASIC METABOLIC PANEL
Anion gap: 14 (ref 5–15)
BUN: 12 mg/dL (ref 6–20)
CO2: 24 mmol/L (ref 22–32)
Calcium: 9.3 mg/dL (ref 8.9–10.3)
Chloride: 94 mmol/L — ABNORMAL LOW (ref 98–111)
Creatinine, Ser: 0.86 mg/dL (ref 0.44–1.00)
GFR, Estimated: >60 mL/min (ref >60)
Glucose, Bld: 463 mg/dL — ABNORMAL HIGH (ref 70–99)
Potassium: 3.3 mmol/L — ABNORMAL LOW (ref 3.5–5.1)
Sodium: 132 mmol/L — ABNORMAL LOW (ref 135–145)

## 2022-01-06 LAB — CBG MONITORING, ED: Glucose-Capillary: 460 mg/dL — ABNORMAL HIGH (ref 70–99)

## 2022-01-06 LAB — PREGNANCY, URINE: Preg Test, Ur: NEGATIVE

## 2022-01-06 LAB — RESP PANEL BY RT-PCR (RSV, FLU A&B, COVID)  RVPGX2
Influenza A by PCR: NEGATIVE
Influenza B by PCR: NEGATIVE
Resp Syncytial Virus by PCR: NEGATIVE
SARS Coronavirus 2 by RT PCR: NEGATIVE

## 2022-01-06 LAB — TROPONIN I (HIGH SENSITIVITY): Troponin I (High Sensitivity): 11 ng/L (ref <18)

## 2022-01-06 NOTE — ED Triage Notes (Signed)
Pt from home via pov with fever, heart racing, sob x 3-4 days. Pt thinks she has the flu. Pt has been checking her blood sugar and adjusting her rate as she feels necessary. Pt thinks she may have the flu. Pt alert & oriented, nad noted.

## 2022-01-07 DIAGNOSIS — R0602 Shortness of breath: Secondary | ICD-10-CM | POA: Diagnosis not present

## 2022-01-07 LAB — URINALYSIS, ROUTINE W REFLEX MICROSCOPIC
Bilirubin Urine: NEGATIVE
Glucose, UA: >1000 mg/dL — AB
Hgb urine dipstick: NEGATIVE
Ketones, ur: NEGATIVE mg/dL
Leukocytes,Ua: NEGATIVE
Nitrite: NEGATIVE
Specific Gravity, Urine: 1.037 — ABNORMAL HIGH (ref 1.005–1.030)
pH: 6 (ref 5.0–8.0)

## 2022-01-07 LAB — TROPONIN I (HIGH SENSITIVITY): Troponin I (High Sensitivity): 13 ng/L (ref <18)

## 2022-01-07 LAB — CBG MONITORING, ED: Glucose-Capillary: 186 mg/dL — ABNORMAL HIGH (ref 70–99)

## 2022-01-07 MED ORDER — DOXYCYCLINE HYCLATE 100 MG PO CAPS: 100.0000 mg | ORAL_CAPSULE | Freq: Two times a day (BID) | ORAL | 0 refills | Status: DC

## 2022-01-07 MED ORDER — DOXYCYCLINE HYCLATE 100 MG PO TABS
100.0000 mg | ORAL_TABLET | Freq: Once | ORAL | Status: AC
  Administered 2022-01-07: 100 mg via ORAL
  Filled 2022-01-07: qty 1

## 2022-01-07 MED ORDER — LIDOCAINE HCL (PF) 1 % IJ SOLN
5.0000 mL | Freq: Once | INTRAMUSCULAR | Status: AC
  Administered 2022-01-07: 5 mL via INTRADERMAL
  Filled 2022-01-07: qty 5

## 2022-01-07 MED ORDER — INSULIN ASPART 100 UNIT/ML IV SOLN
10.0000 [IU] | Freq: Once | INTRAVENOUS | Status: AC
  Administered 2022-01-07: 10 [IU] via INTRAVENOUS

## 2022-01-07 MED ORDER — SODIUM CHLORIDE 0.9 % IV BOLUS
1000.0000 mL | Freq: Once | INTRAVENOUS | Status: AC
  Administered 2022-01-07: 1000 mL via INTRAVENOUS

## 2022-01-07 NOTE — ED Notes (Signed)
Pt requesting assistance.   This nurse to bedside.  Pt reporting HA, body aches, fever/chills and blurry vision which pt attributes to blood glucose being elevated and not being able to hear with sensation of something crawling in her hear.    Pt also reports being possibly bit by a spider a few days ago - has since developed an swelling to the L inner perineal fold; distal to labia - site is indurated and TTP.  No drainage or head noted at site.  Pt has removed vital sign monitoring cables.

## 2022-01-07 NOTE — ED Provider Notes (Signed)
[facility] Provider Note   CSN: [id number] Arrival date & time: 01/06/22  2149     History  Chief Complaint  Patient presents with   Shortness of Breath    Shweta DELONNA NEY is a 56 y.o. female.  Patient is a 56 year old female with past medical history of diabetes on insulin, hypertension, asthma, obstructive sleep apnea, GERD.  Patient presenting today for evaluation of shortness of breath and cough.  This has been worsening over the past 4 days.  She is here with her mother who was ill in a similar fashion.  Cough has been nonproductive.  She denies fevers or chills.  Patient also complains of her right ear being "clogged up".  She reports decreased hearing, but no pain.  She also describes a swollen area to her left groin that she believes is an abscess.  The history is provided by the patient.  Shortness of Breath Severity:  Moderate Onset quality:  Gradual Duration:  4 days      Home Medications Prior to Admission medications   Medication Sig Start Date End Date Taking? Authorizing Provider  albuterol (PROVENTIL) (2.5 MG/3ML) 0.083% nebulizer solution Take 3 mLs (2.5 mg total) by nebulization every 4 (four) hours as needed for wheezing or shortness of breath. 11/10/21 11/10/22  Fransico Meadow, PA-C  apixaban (ELIQUIS) 5 MG TABS tablet Take 1 tablet (5 mg total) by mouth 2 (two) times daily. 10/27/21 01/25/22  Caren Griffins, MD  blood glucose meter kit and supplies KIT Dispense based on patient and insurance preference. Use up to four times daily as directed. 10/27/21   Caren Griffins, MD  furosemide (LASIX) 20 MG tablet Take 1 tablet (20 mg total) by mouth daily. 11/10/21 11/10/22  Fransico Meadow, PA-C  insulin aspart protamine - aspart (NOVOLOG 70/30 MIX) (70-30) 100 UNIT/ML FlexPen Inject 8 Units into the skin 2 (two) times daily with a meal. Can dispense generic Patient taking differently: Inject 30 Units into the skin 2 (two) times  daily with a meal. Can dispense generic 10/27/21   Caren Griffins, MD  Insulin Pen Needle (PEN NEEDLES) 31G X 5 MM MISC 1 each by Does not apply route in the morning and at bedtime. 10/27/21   Caren Griffins, MD  losartan-hydrochlorothiazide (HYZAAR) 50-12.5 MG tablet Take 1 tablet by mouth daily. 10/27/21   Caren Griffins, MD  metoprolol succinate (TOPROL-XL) 100 MG 24 hr tablet Take 1 tablet (100 mg total) by mouth daily. Take with or immediately following a meal. 10/27/21   Gherghe, Vella Redhead, MD  pantoprazole (PROTONIX) 40 MG tablet Take 1 tablet (40 mg total) by mouth daily. Take 30-60 min before first meal of the day 10/27/21   Caren Griffins, MD  predniSONE (DELTASONE) 20 MG tablet Take 1 tablet (20 mg total) by mouth daily with breakfast. 10/28/21   Caren Griffins, MD  rosuvastatin (CRESTOR) 20 MG tablet Take 1 tablet (20 mg total) by mouth daily. 10/27/21 01/25/22  Caren Griffins, MD      Allergies    Advair diskus [fluticasone-salmeterol], Other, and Pork-derived products    Review of Systems   Review of Systems  Respiratory:  Positive for shortness of breath.   All other systems reviewed and are negative.   Physical Exam Updated Vital Signs BP (!) 162/72   Pulse (!) 104   Temp (!) 100.8 F (38.2 C) (Oral)   Resp (!) 24   Ht _0  (1.651  m)   Wt 102.1 kg   LMP 09/30/2012   SpO2 98%   BMI 37.44 kg/m  Physical Exam Vitals and nursing note reviewed.  Constitutional:      General: She is not in acute distress.    Appearance: She is well-developed. She is not diaphoretic.  HENT:     Head: Normocephalic and atraumatic.     Comments: There is wax in the canal of the right ear, but TM is visible.  There is no erythema or bulging.  Left TM is clear. Cardiovascular:     Rate and Rhythm: Normal rate and regular rhythm.     Heart sounds: No murmur heard.    No friction rub. No gallop.  Pulmonary:     Effort: Pulmonary effort is normal. No respiratory distress.      Breath sounds: Normal breath sounds. No wheezing.  Abdominal:     General: Bowel sounds are normal. There is no distension.     Palpations: Abdomen is soft.     Tenderness: There is no abdominal tenderness.  Genitourinary:    Comments: To the left inguinal region, there is a firm, tender area lateral to the labia measuring approximately 3 cm x 5 cm. Musculoskeletal:        General: Normal range of motion.     Cervical back: Normal range of motion and neck supple.  Skin:    General: Skin is warm and dry.  Neurological:     General: No focal deficit present.     Mental Status: She is alert and oriented to person, place, and time.     ED Results / Procedures / Treatments   Labs (all labs ordered are listed, but only abnormal results are displayed) Labs Reviewed  BASIC METABOLIC PANEL - Abnormal; Notable for the following components:      Result Value   Sodium 132 (*)    Potassium 3.3 (*)    Chloride 94 (*)    Glucose, Bld 463 (*)    All other components within normal limits  CBC - Abnormal; Notable for the following components:   WBC 12.0 (*)    All other components within normal limits  URINALYSIS, ROUTINE W REFLEX MICROSCOPIC - Abnormal; Notable for the following components:   Specific Gravity, Urine 1.037 (*)    Glucose, UA >1,000 (*)    Protein, ur TRACE (*)    All other components within normal limits  CBG MONITORING, ED - Abnormal; Notable for the following components:   Glucose-Capillary 460 (*)    All other components within normal limits  RESP PANEL BY RT-PCR (RSV, FLU A&B, COVID)  RVPGX2  PREGNANCY, URINE  TROPONIN I (HIGH SENSITIVITY)  TROPONIN I (HIGH SENSITIVITY)    EKG None  Radiology DG Chest 2 View  Result Date: 01/06/2022 CLINICAL DATA:  Chest pain EXAM: CHEST - 2 VIEW COMPARISON:  11/10/2021 FINDINGS: Minimal left basilar atelectasis or infiltrate. Lungs are otherwise clear. No pneumothorax or pleural effusion. Cardiac size within normal limits.  Pulmonary vascularity is normal. Osseous structures are age-appropriate. No acute bone abnormality. IMPRESSION: 1. Minimal left basilar atelectasis or infiltrate. Electronically Signed   By: Fidela Salisbury M.D.   On: 01/06/2022 23:21    Procedures Procedures  INCISION AND DRAINAGE Performed by: Veryl Speak Consent: Verbal consent obtained. Risks and benefits: risks, benefits and alternatives were discussed Type: abscess  Body area: Left inguinal region  Anesthesia: local infiltration  Incision was made with a scalpel.  Local anesthetic: lidocaine 1%  without epinephrine  Anesthetic total: 3 ml  Complexity: complex Blunt dissection to break up loculations  Drainage: purulent  Drainage amount: Moderate  Packing material: No packing placed  Patient tolerance: Patient tolerated the procedure well with no immediate complications.    Medications Ordered in ED Medications  lidocaine (PF) (XYLOCAINE) 1 % injection 5 mL (has no administration in time range)    ED Course/ Medical Decision Making/ A&P  Patient is a 56 year old female presenting with complaints of shortness of breath and cough.  She also describes a swelling to the left groin.  She arrives here with stable vital signs and physical examination which is basically unremarkable except for findings consistent with an abscess to the left groin.  Laboratory studies obtained including CBC, metabolic panel, and swabs for COVID, flu, and RSV.  These studies are all unremarkable except for a mild leukocytosis with white count of 12.  Chest x-ray obtained showing streaking in the left lower lobe, possibly consistent with pneumonia.  The abscess in the left groin was incised and drained as above.  Patient to be treated with doxycycline for the groin abscess and also may help in case she is developing an infiltrate in her lung.  She was also initially found to have a blood sugar of 463, but has improved to 180 with fluids and  insulin.  At this point, I feel as though she can safely be discharged with outpatient follow-up and as needed return.  Final Clinical Impression(s) / ED Diagnoses Final diagnoses:  None    Rx / DC Orders ED Discharge Orders     None         Veryl Speak, MD 01/07/22 [phone]

## 2022-01-07 NOTE — ED Notes (Signed)
Pt has allowed this nurse to re apply pulse ox and cardiac leads- pt states bp cuff causes too much discomfort

## 2022-01-07 NOTE — Discharge Instructions (Signed)
Begin taking doxycycline as prescribed.  Perform warm compresses and/or sitz bath's as frequently as possible for the next several days.  Return to the emergency department if you develop difficulty breathing, severe chest pain, worsening swelling, or for other new and concerning symptoms.

## 2022-01-27 ENCOUNTER — Other Ambulatory Visit: Payer: Self-pay

## 2022-01-27 ENCOUNTER — Emergency Department (HOSPITAL_BASED_OUTPATIENT_CLINIC_OR_DEPARTMENT_OTHER)
Admission: EM | Admit: 2022-01-27 | Discharge: 2022-01-28 | Disposition: A | Discharge status: Home / Self Care | Payer: Medicare Other | Attending: Emergency Medicine | Admitting: Emergency Medicine

## 2022-01-27 ENCOUNTER — Emergency Department (HOSPITAL_BASED_OUTPATIENT_CLINIC_OR_DEPARTMENT_OTHER): Payer: Medicare Other

## 2022-01-27 ENCOUNTER — Encounter (HOSPITAL_BASED_OUTPATIENT_CLINIC_OR_DEPARTMENT_OTHER): Payer: Self-pay

## 2022-01-27 DIAGNOSIS — Z794 Long term (current) use of insulin: Secondary | ICD-10-CM | POA: Insufficient documentation

## 2022-01-27 DIAGNOSIS — L03012 Cellulitis of left finger: Secondary | ICD-10-CM | POA: Insufficient documentation

## 2022-01-27 DIAGNOSIS — S6992XA Unspecified injury of left wrist, hand and finger(s), initial encounter: Secondary | ICD-10-CM | POA: Diagnosis not present

## 2022-01-27 DIAGNOSIS — Z7901 Long term (current) use of anticoagulants: Secondary | ICD-10-CM | POA: Insufficient documentation

## 2022-01-27 DIAGNOSIS — W230XXA Caught, crushed, jammed, or pinched between moving objects, initial encounter: Secondary | ICD-10-CM | POA: Diagnosis not present

## 2022-01-27 DIAGNOSIS — L03019 Cellulitis of unspecified finger: Secondary | ICD-10-CM

## 2022-01-27 DIAGNOSIS — Z23 Encounter for immunization: Secondary | ICD-10-CM | POA: Insufficient documentation

## 2022-01-27 DIAGNOSIS — Z79899 Other long term (current) drug therapy: Secondary | ICD-10-CM | POA: Diagnosis not present

## 2022-01-27 DIAGNOSIS — S60943A Unspecified superficial injury of left middle finger, initial encounter: Secondary | ICD-10-CM | POA: Diagnosis not present

## 2022-01-27 MED ORDER — DOXYCYCLINE HYCLATE 100 MG PO CAPS: 100.0000 mg | ORAL_CAPSULE | Freq: Two times a day (BID) | ORAL | 0 refills | Status: DC

## 2022-01-27 MED ORDER — LIDOCAINE HCL (PF) 1 % IJ SOLN
INTRAMUSCULAR | Status: AC
  Filled 2022-01-27: qty 5

## 2022-01-27 MED ORDER — TETANUS-DIPHTH-ACELL PERTUSSIS 5-2.5-18.5 LF-MCG/0.5 IM SUSY
0.5000 mL | PREFILLED_SYRINGE | Freq: Once | INTRAMUSCULAR | Status: AC
  Administered 2022-01-27: 0.5 mL via INTRAMUSCULAR
  Filled 2022-01-27: qty 0.5

## 2022-01-27 MED ORDER — CEPHALEXIN 250 MG PO CAPS
500.0000 mg | ORAL_CAPSULE | Freq: Once | ORAL | Status: AC
  Administered 2022-01-27: 500 mg via ORAL
  Filled 2022-01-27: qty 2

## 2022-01-27 MED ORDER — CEPHALEXIN 500 MG PO CAPS: 500.0000 mg | ORAL_CAPSULE | Freq: Four times a day (QID) | ORAL | 0 refills | Status: DC

## 2022-01-27 MED ORDER — HYDROCODONE-ACETAMINOPHEN 5-325 MG PO TABS: 2.0000 | ORAL_TABLET | ORAL | 0 refills | Status: DC | PRN

## 2022-01-27 MED ORDER — LIDOCAINE HCL (PF) 1 % IJ SOLN
10.0000 mL | Freq: Once | INTRAMUSCULAR | Status: AC
  Administered 2022-01-27: 10 mL
  Filled 2022-01-27: qty 10

## 2022-01-27 MED ORDER — HYDROCODONE-ACETAMINOPHEN 5-325 MG PO TABS
1.0000 | ORAL_TABLET | Freq: Once | ORAL | Status: AC
  Administered 2022-01-27: 1 via ORAL
  Filled 2022-01-27: qty 1

## 2022-01-27 NOTE — Discharge Instructions (Addendum)
Watch for signs of worsening finger infection which include swelling throughout the entire finger, tenderness about the base of the finger along the palm, inability to passively extend the finger, worsening pain and swelling.  This would be indicative of an emergent finger condition requiring surgery.  You had a fingertip infection which was incised and drained today you had a questionable fracture on your x-ray. We will splint your finger in extension and have you follow-up with hand surgery.

## 2022-01-27 NOTE — ED Triage Notes (Signed)
Patient here POV from Home.  Endorses crushing her Left Third Distal Digit in a Car Door 3 Days ago. Swelling and Pain that began mostly today.   NAD Noted during Triage. A&Ox4. GCS 15. Ambulatory.

## 2022-01-27 NOTE — ED Provider Notes (Incomplete)
[facility] EMERGENCY DEPT Provider Note   CSN: [id number] Arrival date & time: 01/27/22  1942     History  Chief Complaint  Patient presents with   Finger Injury    Rebecca Malone is a 57 y.o. female.  HPI   57 year old female presenting to the emergency department with a left finger injury.  She states she sustained a crush injury by closing in a car door 3 days ago.  Her tetanus is not up-to-date.  She endorses swelling and pain that worsened over the last 24 hours to the distal aspect of the left third digit.  She denies any fevers or chills, no other injuries or complaints at this time.  Home Medications Prior to Admission medications   Medication Sig Start Date End Date Taking? Authorizing Provider  cephALEXin (KEFLEX) 500 MG capsule Take 1 capsule (500 mg total) by mouth 4 (four) times daily. 01/27/22  Yes Regan Lemming, MD  doxycycline (VIBRAMYCIN) 100 MG capsule Take 1 capsule (100 mg total) by mouth 2 (two) times daily for 5 days. 01/27/22 02/01/22 Yes Regan Lemming, MD  HYDROcodone-acetaminophen (NORCO/VICODIN) 5-325 MG tablet Take 2 tablets by mouth every 4 (four) hours as needed. 01/27/22  Yes Regan Lemming, MD  albuterol (PROVENTIL) (2.5 MG/3ML) 0.083% nebulizer solution Take 3 mLs (2.5 mg total) by nebulization every 4 (four) hours as needed for wheezing or shortness of breath. 11/10/21 11/10/22  Fransico Meadow, PA-C  apixaban (ELIQUIS) 5 MG TABS tablet Take 1 tablet (5 mg total) by mouth 2 (two) times daily. 10/27/21 01/25/22  Caren Griffins, MD  blood glucose meter kit and supplies KIT Dispense based on patient and insurance preference. Use up to four times daily as directed. 10/27/21   Caren Griffins, MD  furosemide (LASIX) 20 MG tablet Take 1 tablet (20 mg total) by mouth daily. 11/10/21 11/10/22  Fransico Meadow, PA-C  insulin aspart protamine - aspart (NOVOLOG 70/30 MIX) (70-30) 100 UNIT/ML FlexPen Inject 8 Units into the skin 2 (two) times daily  with a meal. Can dispense generic Patient taking differently: Inject 30 Units into the skin 2 (two) times daily with a meal. Can dispense generic 10/27/21   Caren Griffins, MD  Insulin Pen Needle (PEN NEEDLES) 31G X 5 MM MISC 1 each by Does not apply route in the morning and at bedtime. 10/27/21   Caren Griffins, MD  losartan-hydrochlorothiazide (HYZAAR) 50-12.5 MG tablet Take 1 tablet by mouth daily. 10/27/21   Caren Griffins, MD  metoprolol succinate (TOPROL-XL) 100 MG 24 hr tablet Take 1 tablet (100 mg total) by mouth daily. Take with or immediately following a meal. 10/27/21   Gherghe, Vella Redhead, MD  pantoprazole (PROTONIX) 40 MG tablet Take 1 tablet (40 mg total) by mouth daily. Take 30-60 min before first meal of the day 10/27/21   Caren Griffins, MD  predniSONE (DELTASONE) 20 MG tablet Take 1 tablet (20 mg total) by mouth daily with breakfast. 10/28/21   Caren Griffins, MD  rosuvastatin (CRESTOR) 20 MG tablet Take 1 tablet (20 mg total) by mouth daily. 10/27/21 01/25/22  Caren Griffins, MD      Allergies    Advair diskus [fluticasone-salmeterol], Other, and Pork-derived products    Review of Systems   Review of Systems  All other systems reviewed and are negative.   Physical Exam Updated Vital Signs BP (!) 148/74 (BP Location: Right Arm)   Pulse 80   Temp 98.1 F (36.7 C) (  Oral)   Resp 14   Ht _0  (1.651 m)   Wt 102.1 kg   LMP 09/30/2012   SpO2 99%   BMI 37.46 kg/m  Physical Exam Vitals and nursing note reviewed.  Constitutional:      General: She is not in acute distress. HENT:     Head: Normocephalic and atraumatic.  Eyes:     Conjunctiva/sclera: Conjunctivae normal.     Pupils: Pupils are equal, round, and reactive to light.  Cardiovascular:     Rate and Rhythm: Normal rate and regular rhythm.  Pulmonary:     Effort: Pulmonary effort is normal. No respiratory distress.  Abdominal:     General: There is no distension.     Tenderness: There is no  guarding.  Musculoskeletal:        General: No deformity or signs of injury.     Cervical back: Neck supple.     Comments: Left distal third digit, evidence of felon with swelling, tenderness to palpation.,  No tenderness about the flexor tendon sheath, no fusiform swelling of the digit, no pain with passive extension. Pt able to extend the distal aspect of the digit without evidence of extensor tendon injury  Skin:    Findings: No lesion or rash.  Neurological:     General: No focal deficit present.     Mental Status: She is alert. Mental status is at baseline.     ED Results / Procedures / Treatments   Labs (all labs ordered are listed, but only abnormal results are displayed) Labs Reviewed - No data to display  EKG None  Radiology DG Finger Middle Left  Result Date: 01/27/2022 CLINICAL DATA:  Injury EXAM: LEFT MIDDLE FINGER 2+V COMPARISON:  None Available. FINDINGS: Possible nondisplaced tuft fracture on lateral view. No malalignment. Positive for soft tissue swelling IMPRESSION: Possible nondisplaced tuft fracture Electronically Signed   By: Donavan Foil M.D.   On: 01/27/2022 21:00    Procedures .Marland KitchenIncision and Drainage  Date/Time: 01/27/2022 11:25 PM  Performed by: Regan Lemming, MD Authorized by: Regan Lemming, MD   Consent:    Consent obtained:  Verbal   Consent given by:  Patient   Risks discussed:  Bleeding, incomplete drainage and pain Universal protocol:    Patient identity confirmed:  Arm band Location:    Type:  Abscess   Size:  1cm   Location:  Upper extremity   Upper extremity location:  Finger   Finger location:  L long finger Sedation:    Sedation type:  None Anesthesia:    Anesthesia method:  Nerve block   Block location:  Left 3rd digit   Block needle gauge:  25 G   Block anesthetic:  Lidocaine 1% w/o epi   Block technique:  Ring   Block outcome:  Anesthesia achieved Procedure type:    Complexity:  Simple Procedure details:    Incision types:   Stab incision   Wound management:  Probed and deloculated and irrigated with saline   Drainage:  Serosanguinous and purulent   Drainage amount:  Moderate   Wound treatment:  Wound left open Post-procedure details:    Procedure completion:  Tolerated     Medications Ordered in ED Medications  HYDROcodone-acetaminophen (NORCO/VICODIN) 5-325 MG per tablet 1 tablet (1 tablet Oral Given 01/27/22 2255)  cephALEXin (KEFLEX) capsule 500 mg (500 mg Oral Given 01/27/22 2255)  Tdap (BOOSTRIX) injection 0.5 mL (0.5 mLs Intramuscular Given 01/27/22 2254)  lidocaine (PF) (XYLOCAINE) 1 % injection 10  mL ( Infiltration Canceled Entry 01/27/22 2304)    ED Course/ Medical Decision Making/ A&P                           Medical Decision Making Amount and/or Complexity of Data Reviewed Radiology: ordered.  Risk Prescription drug management.    57 year old female presenting to the emergency department with a left finger injury.  She states she sustained a crush injury by closing in a car door 3 days ago.  Her tetanus is not up-to-date.  She endorses swelling and pain that worsened over the last 24 hours to the distal aspect of the left third digit.  She denies any fevers or chills, no other injuries or complaints at this time.  On arrival, the patient was afebrile, hemodynamically stable.  Physical exam concerning for a felon along the left third digit, no evidence for Knievel signs to suggest flexor tenosynovitis.  Patient may have had a small abrasion to the digit during her crush injury which has become infectious.   X-ray imaging revealed a questionable tuft fracture however this is not clearly visualized.  Patient has swelling consistent with likely infectious process.  Will proceed with I&D and treat with antibiotics.  I&D performed as per the procedure note above, expression of serosanguineous fluid in addition to purulence noted bedside.  Wound was dressed by nursing staff and splinted.  Patient will be  placed on antibiotics.   Her tetanus was updated.  Will prescribe Norco for pain control. Given the question of tuft fracture, need for I&D, will have the patient follow-up outpatient with hand.  Referral to outpatient hand placed, return precautions for signs of flexor tenosynovitis given.  Stable for discharge at this time.  DC Instructions: Watch for signs of worsening finger infection which include swelling throughout the entire finger, tenderness about the base of the finger along the palm, inability to passively extend the finger, worsening pain and swelling.  This would be indicative of an emergent finger condition requiring surgery.  You had a fingertip infection which was incised and drained today you had a questionable fracture on your x-ray. We will splint your finger in extension and have you follow-up with hand surgery.   Final Clinical Impression(s) / ED Diagnoses Final diagnoses:  Felon of finger    Rx / DC Orders ED Discharge Orders          Ordered    cephALEXin (KEFLEX) 500 MG capsule  4 times daily        01/27/22 2324    HYDROcodone-acetaminophen (NORCO/VICODIN) 5-325 MG tablet  Every 4 hours PRN        01/27/22 2324    doxycycline (VIBRAMYCIN) 100 MG capsule  2 times daily        01/27/22 2325    Ambulatory referral to Hand Surgery        01/27/22 2331              Regan Lemming, MD 01/28/22 1132    Regan Lemming, MD 01/28/22 1133

## 2022-01-28 ENCOUNTER — Emergency Department (HOSPITAL_BASED_OUTPATIENT_CLINIC_OR_DEPARTMENT_OTHER): Payer: 59

## 2022-01-28 ENCOUNTER — Emergency Department (HOSPITAL_BASED_OUTPATIENT_CLINIC_OR_DEPARTMENT_OTHER)
Admission: EM | Admit: 2022-01-28 | Discharge: 2022-01-29 | Disposition: A | Discharge status: Home / Self Care | Payer: 59 | Attending: Emergency Medicine | Admitting: Emergency Medicine

## 2022-01-28 ENCOUNTER — Encounter (HOSPITAL_BASED_OUTPATIENT_CLINIC_OR_DEPARTMENT_OTHER): Payer: Self-pay

## 2022-01-28 DIAGNOSIS — Z794 Long term (current) use of insulin: Secondary | ICD-10-CM | POA: Diagnosis not present

## 2022-01-28 DIAGNOSIS — L03012 Cellulitis of left finger: Secondary | ICD-10-CM | POA: Diagnosis not present

## 2022-01-28 DIAGNOSIS — M7989 Other specified soft tissue disorders: Secondary | ICD-10-CM | POA: Diagnosis not present

## 2022-01-28 DIAGNOSIS — M79642 Pain in left hand: Secondary | ICD-10-CM | POA: Diagnosis present

## 2022-01-28 MED ORDER — LIDOCAINE HCL (PF) 1 % IJ SOLN
5.0000 mL | Freq: Once | INTRAMUSCULAR | Status: AC
  Administered 2022-01-28: 5 mL via INTRADERMAL
  Filled 2022-01-28: qty 5
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
## 2022-01-29 DIAGNOSIS — L03012 Cellulitis of left finger: Secondary | ICD-10-CM | POA: Diagnosis not present

## 2022-01-29 MED ORDER — CLINDAMYCIN HCL 150 MG PO CAPS: 300.0000 mg | ORAL_CAPSULE | Freq: Three times a day (TID) | ORAL | 0 refills | Status: DC

## 2022-01-29 MED ORDER — CLINDAMYCIN HCL 150 MG PO CAPS
300.0000 mg | ORAL_CAPSULE | Freq: Once | ORAL | Status: AC
  Administered 2022-01-29: 300 mg via ORAL
  Filled 2022-01-29: qty 2

## 2022-01-29 MED ORDER — OXYCODONE-ACETAMINOPHEN 5-325 MG PO TABS: 1.0000 | ORAL_TABLET | Freq: Four times a day (QID) | ORAL | 0 refills | Status: DC | PRN

## 2022-01-29 NOTE — Discharge Instructions (Addendum)
Get help right away if: You have severe pain or bleeding. You cannot eat or drink without vomiting. You have a fever or chills. You have redness that spreads quickly. You have decreased urine output. You become short of breath. You have chest pain. You cough up blood. The affected area becomes numb or starts to tingle.

## 2022-01-29 NOTE — ED Notes (Signed)
Pt verbalized understanding of d/c instructions, meds, and followup care. Denies questions. VSS, no distress noted. Steady gait to exit with all belongings.  ?

## 2022-01-30 ENCOUNTER — Other Ambulatory Visit: Payer: Self-pay

## 2022-01-30 ENCOUNTER — Inpatient Hospital Stay (HOSPITAL_BASED_OUTPATIENT_CLINIC_OR_DEPARTMENT_OTHER): Payer: 59 | Admitting: Anesthesiology

## 2022-01-30 ENCOUNTER — Inpatient Hospital Stay (HOSPITAL_COMMUNITY): Payer: 59 | Admitting: Anesthesiology

## 2022-01-30 ENCOUNTER — Encounter (HOSPITAL_COMMUNITY): Payer: Self-pay | Admitting: Orthopedic Surgery

## 2022-01-30 ENCOUNTER — Observation Stay (HOSPITAL_COMMUNITY)
Admission: RE | Admit: 2022-01-30 | Discharge: 2022-01-31 | Disposition: A | Discharge status: Home / Self Care | Payer: 59 | Source: Other Acute Inpatient Hospital | Attending: Internal Medicine | Admitting: Internal Medicine

## 2022-01-30 ENCOUNTER — Other Ambulatory Visit: Payer: Self-pay | Admitting: Orthopedic Surgery

## 2022-01-30 ENCOUNTER — Encounter (HOSPITAL_COMMUNITY)
Admission: RE | Disposition: A | Discharge status: Home / Self Care | Payer: Self-pay | Source: Other Acute Inpatient Hospital | Attending: Internal Medicine

## 2022-01-30 DIAGNOSIS — F17210 Nicotine dependence, cigarettes, uncomplicated: Secondary | ICD-10-CM | POA: Diagnosis not present

## 2022-01-30 DIAGNOSIS — Z794 Long term (current) use of insulin: Secondary | ICD-10-CM | POA: Insufficient documentation

## 2022-01-30 DIAGNOSIS — J45909 Unspecified asthma, uncomplicated: Secondary | ICD-10-CM | POA: Diagnosis not present

## 2022-01-30 DIAGNOSIS — I252 Old myocardial infarction: Secondary | ICD-10-CM

## 2022-01-30 DIAGNOSIS — L03012 Cellulitis of left finger: Secondary | ICD-10-CM | POA: Insufficient documentation

## 2022-01-30 DIAGNOSIS — Z79899 Other long term (current) drug therapy: Secondary | ICD-10-CM | POA: Diagnosis not present

## 2022-01-30 DIAGNOSIS — E119 Type 2 diabetes mellitus without complications: Secondary | ICD-10-CM | POA: Diagnosis not present

## 2022-01-30 DIAGNOSIS — L02512 Cutaneous abscess of left hand: Secondary | ICD-10-CM

## 2022-01-30 DIAGNOSIS — I110 Hypertensive heart disease with heart failure: Secondary | ICD-10-CM

## 2022-01-30 DIAGNOSIS — I5032 Chronic diastolic (congestive) heart failure: Secondary | ICD-10-CM | POA: Diagnosis not present

## 2022-01-30 DIAGNOSIS — I10 Essential (primary) hypertension: Secondary | ICD-10-CM | POA: Diagnosis present

## 2022-01-30 DIAGNOSIS — F17200 Nicotine dependence, unspecified, uncomplicated: Secondary | ICD-10-CM | POA: Diagnosis not present

## 2022-01-30 DIAGNOSIS — G4733 Obstructive sleep apnea (adult) (pediatric): Secondary | ICD-10-CM | POA: Insufficient documentation

## 2022-01-30 DIAGNOSIS — M79645 Pain in left finger(s): Secondary | ICD-10-CM | POA: Diagnosis not present

## 2022-01-30 DIAGNOSIS — Z7901 Long term (current) use of anticoagulants: Secondary | ICD-10-CM | POA: Diagnosis not present

## 2022-01-30 DIAGNOSIS — I509 Heart failure, unspecified: Secondary | ICD-10-CM

## 2022-01-30 DIAGNOSIS — I480 Paroxysmal atrial fibrillation: Secondary | ICD-10-CM | POA: Diagnosis not present

## 2022-01-30 DIAGNOSIS — Z9889 Other specified postprocedural states: Secondary | ICD-10-CM | POA: Diagnosis not present

## 2022-01-30 HISTORY — PX: I & D EXTREMITY: SHX5045

## 2022-01-30 LAB — GLUCOSE, CAPILLARY
Glucose-Capillary: 379 mg/dL — ABNORMAL HIGH (ref 70–99)
Glucose-Capillary: 353 mg/dL — ABNORMAL HIGH (ref 70–99)
Glucose-Capillary: 120 mg/dL — ABNORMAL HIGH (ref 70–99)
Glucose-Capillary: 69 mg/dL — ABNORMAL LOW (ref 70–99)
Glucose-Capillary: 106 mg/dL — ABNORMAL HIGH (ref 70–99)
Glucose-Capillary: 87 mg/dL (ref 70–99)
Glucose-Capillary: 99 mg/dL (ref 70–99)
Glucose-Capillary: 252 mg/dL — ABNORMAL HIGH (ref 70–99)

## 2022-01-30 LAB — BASIC METABOLIC PANEL
Anion gap: 9 (ref 5–15)
BUN: 9 mg/dL (ref 6–20)
CO2: 25 mmol/L (ref 22–32)
Calcium: 8.8 mg/dL — ABNORMAL LOW (ref 8.9–10.3)
Chloride: 102 mmol/L (ref 98–111)
Creatinine, Ser: 0.85 mg/dL (ref 0.44–1.00)
GFR, Estimated: >60 mL/min (ref >60)
Glucose, Bld: 343 mg/dL — ABNORMAL HIGH (ref 70–99)
Potassium: 3.7 mmol/L (ref 3.5–5.1)
Sodium: 136 mmol/L (ref 135–145)

## 2022-01-30 SURGERY — IRRIGATION AND DEBRIDEMENT EXTREMITY
Anesthesia: General | Laterality: Left

## 2022-01-30 MED ORDER — 0.9 % SODIUM CHLORIDE (POUR BTL) OPTIME
TOPICAL | Status: DC | PRN
  Administered 2022-01-30: 1000 mL

## 2022-01-30 MED ORDER — LIDOCAINE 2% (20 MG/ML) 5 ML SYRINGE
INTRAMUSCULAR | Status: AC
  Filled 2022-01-30: qty 5

## 2022-01-30 MED ORDER — LABETALOL HCL 5 MG/ML IV SOLN: 10.0000 mg | INTRAVENOUS | Status: DC | PRN

## 2022-01-30 MED ORDER — FENTANYL CITRATE (PF) 100 MCG/2ML IJ SOLN: 25.0000 ug | INTRAMUSCULAR | Status: DC | PRN

## 2022-01-30 MED ORDER — ONDANSETRON HCL 4 MG/2ML IJ SOLN
INTRAMUSCULAR | Status: AC
  Filled 2022-01-30: qty 2

## 2022-01-30 MED ORDER — DEXAMETHASONE SODIUM PHOSPHATE 10 MG/ML IJ SOLN
INTRAMUSCULAR | Status: DC | PRN
  Administered 2022-01-30: 4 mg via INTRAVENOUS

## 2022-01-30 MED ORDER — PROPOFOL 10 MG/ML IV BOLUS
INTRAVENOUS | Status: DC | PRN
  Administered 2022-01-30: 200 mg via INTRAVENOUS

## 2022-01-30 MED ORDER — ACETAMINOPHEN 10 MG/ML IV SOLN: 1000.0000 mg | Freq: Once | INTRAVENOUS | Status: DC | PRN

## 2022-01-30 MED ORDER — ONDANSETRON HCL 4 MG/2ML IJ SOLN: 4.0000 mg | Freq: Four times a day (QID) | INTRAMUSCULAR | Status: DC | PRN

## 2022-01-30 MED ORDER — BUPIVACAINE HCL (PF) 0.25 % IJ SOLN
INTRAMUSCULAR | Status: AC
  Filled 2022-01-30: qty 30

## 2022-01-30 MED ORDER — ONDANSETRON HCL 4 MG/2ML IJ SOLN
INTRAMUSCULAR | Status: DC | PRN
  Administered 2022-01-30: 4 mg via INTRAVENOUS

## 2022-01-30 MED ORDER — SULFAMETHOXAZOLE-TRIMETHOPRIM 800-160 MG PO TABS
1.0000 | ORAL_TABLET | Freq: Two times a day (BID) | ORAL | Status: DC
  Administered 2022-01-30: 1 via ORAL
  Filled 2022-01-30 (×2): qty 1

## 2022-01-30 MED ORDER — VANCOMYCIN HCL 1000 MG IV SOLR
INTRAVENOUS | Status: DC | PRN
  Administered 2022-01-30: 1000 mg via INTRAVENOUS

## 2022-01-30 MED ORDER — ONDANSETRON HCL 4 MG PO TABS: 4.0000 mg | ORAL_TABLET | Freq: Four times a day (QID) | ORAL | Status: DC | PRN

## 2022-01-30 MED ORDER — ORAL CARE MOUTH RINSE: 15.0000 mL | Freq: Once | OROMUCOSAL | Status: AC

## 2022-01-30 MED ORDER — HYDRALAZINE HCL 20 MG/ML IJ SOLN: 10.0000 mg | INTRAMUSCULAR | Status: DC | PRN

## 2022-01-30 MED ORDER — POLYETHYLENE GLYCOL 3350 17 G PO PACK: 17.0000 g | PACK | Freq: Every day | ORAL | Status: DC | PRN

## 2022-01-30 MED ORDER — EPHEDRINE SULFATE-NACL 50-0.9 MG/10ML-% IV SOSY
PREFILLED_SYRINGE | INTRAVENOUS | Status: DC | PRN
  Administered 2022-01-30: 10 mg via INTRAVENOUS

## 2022-01-30 MED ORDER — INSULIN ASPART 100 UNIT/ML IJ SOLN
0.0000 [IU] | INTRAMUSCULAR | Status: DC | PRN
  Administered 2022-01-30: 12 [IU] via SUBCUTANEOUS
  Filled 2022-01-30 (×2): qty 1

## 2022-01-30 MED ORDER — PHENYLEPHRINE 80 MCG/ML (10ML) SYRINGE FOR IV PUSH (FOR BLOOD PRESSURE SUPPORT)
PREFILLED_SYRINGE | INTRAVENOUS | Status: DC | PRN
  Administered 2022-01-30: 80 ug via INTRAVENOUS
  Administered 2022-01-30: 160 ug via INTRAVENOUS

## 2022-01-30 MED ORDER — DEXAMETHASONE SODIUM PHOSPHATE 10 MG/ML IJ SOLN
INTRAMUSCULAR | Status: AC
  Filled 2022-01-30: qty 1

## 2022-01-30 MED ORDER — ACETAMINOPHEN 650 MG RE SUPP: 650.0000 mg | Freq: Four times a day (QID) | RECTAL | Status: DC | PRN

## 2022-01-30 MED ORDER — INSULIN ASPART 100 UNIT/ML IJ SOLN
0.0000 [IU] | Freq: Every day | INTRAMUSCULAR | Status: DC
  Administered 2022-01-30: 3 [IU] via SUBCUTANEOUS

## 2022-01-30 MED ORDER — INSULIN ASPART 100 UNIT/ML IJ SOLN: 0.0000 [IU] | Freq: Three times a day (TID) | INTRAMUSCULAR | Status: DC

## 2022-01-30 MED ORDER — MIDAZOLAM HCL 2 MG/2ML IJ SOLN
INTRAMUSCULAR | Status: AC
  Filled 2022-01-30: qty 2

## 2022-01-30 MED ORDER — DEXTROSE 50 % IV SOLN
25.0000 mL | Freq: Once | INTRAVENOUS | Status: AC
  Administered 2022-01-30: 25 mL via INTRAVENOUS

## 2022-01-30 MED ORDER — METOPROLOL SUCCINATE ER 25 MG PO TB24
100.0000 mg | ORAL_TABLET | Freq: Once | ORAL | Status: AC
  Administered 2022-01-30: 100 mg via ORAL
  Filled 2022-01-30: qty 4

## 2022-01-30 MED ORDER — FENTANYL CITRATE (PF) 250 MCG/5ML IJ SOLN
INTRAMUSCULAR | Status: AC
  Filled 2022-01-30: qty 5

## 2022-01-30 MED ORDER — DEXTROSE 50 % IV SOLN
INTRAVENOUS | Status: DC | PRN
  Administered 2022-01-30: 25 mL via INTRAVENOUS

## 2022-01-30 MED ORDER — MIDAZOLAM HCL 5 MG/5ML IJ SOLN
INTRAMUSCULAR | Status: DC | PRN
  Administered 2022-01-30: 2 mg via INTRAVENOUS

## 2022-01-30 MED ORDER — LOSARTAN POTASSIUM 50 MG PO TABS
50.0000 mg | ORAL_TABLET | Freq: Every day | ORAL | Status: DC
  Administered 2022-01-30: 50 mg via ORAL
  Filled 2022-01-30: qty 1

## 2022-01-30 MED ORDER — SODIUM CHLORIDE 0.9% FLUSH
3.0000 mL | Freq: Two times a day (BID) | INTRAVENOUS | Status: DC
  Administered 2022-01-30: 3 mL via INTRAVENOUS

## 2022-01-30 MED ORDER — EPHEDRINE 5 MG/ML INJ
INTRAVENOUS | Status: AC
  Filled 2022-01-30: qty 5

## 2022-01-30 MED ORDER — OXYCODONE HCL 5 MG PO TABS
5.0000 mg | ORAL_TABLET | ORAL | Status: DC | PRN
  Administered 2022-01-31: 5 mg via ORAL
  Filled 2022-01-30: qty 1

## 2022-01-30 MED ORDER — ACETAMINOPHEN 325 MG PO TABS: 650.0000 mg | ORAL_TABLET | Freq: Four times a day (QID) | ORAL | Status: DC | PRN

## 2022-01-30 MED ORDER — MORPHINE SULFATE (PF) 2 MG/ML IV SOLN: 2.0000 mg | INTRAVENOUS | Status: DC | PRN

## 2022-01-30 MED ORDER — LOSARTAN POTASSIUM-HCTZ 50-12.5 MG PO TABS: 1.0000 | ORAL_TABLET | Freq: Every day | ORAL | Status: DC

## 2022-01-30 MED ORDER — HYDRALAZINE HCL 20 MG/ML IJ SOLN
INTRAMUSCULAR | Status: AC
  Filled 2022-01-30: qty 1

## 2022-01-30 MED ORDER — PHENYLEPHRINE HCL-NACL 20-0.9 MG/250ML-% IV SOLN
INTRAVENOUS | Status: DC | PRN
  Administered 2022-01-30: 40 ug/min via INTRAVENOUS

## 2022-01-30 MED ORDER — BUPIVACAINE HCL (PF) 0.25 % IJ SOLN
INTRAMUSCULAR | Status: DC | PRN
  Administered 2022-01-30: 9 mL

## 2022-01-30 MED ORDER — FENTANYL CITRATE (PF) 250 MCG/5ML IJ SOLN
INTRAMUSCULAR | Status: DC | PRN
  Administered 2022-01-30 (×3): 25 ug via INTRAVENOUS

## 2022-01-30 MED ORDER — HYDROCHLOROTHIAZIDE 12.5 MG PO TABS
12.5000 mg | ORAL_TABLET | Freq: Every day | ORAL | Status: DC
  Administered 2022-01-30: 12.5 mg via ORAL
  Filled 2022-01-30: qty 1

## 2022-01-30 MED ORDER — CHLORHEXIDINE GLUCONATE 0.12 % MT SOLN
15.0000 mL | Freq: Once | OROMUCOSAL | Status: AC
  Administered 2022-01-30: 15 mL via OROMUCOSAL
  Filled 2022-01-30: qty 15

## 2022-01-30 MED ORDER — PROPOFOL 10 MG/ML IV BOLUS
INTRAVENOUS | Status: AC
  Filled 2022-01-30: qty 20

## 2022-01-30 MED ORDER — PHENYLEPHRINE 80 MCG/ML (10ML) SYRINGE FOR IV PUSH (FOR BLOOD PRESSURE SUPPORT)
PREFILLED_SYRINGE | INTRAVENOUS | Status: AC
  Filled 2022-01-30: qty 10

## 2022-01-30 MED ORDER — HYDRALAZINE HCL 20 MG/ML IJ SOLN
5.0000 mg | Freq: Once | INTRAMUSCULAR | Status: AC
  Administered 2022-01-30: 5 mg via INTRAVENOUS

## 2022-01-30 MED ORDER — LACTATED RINGERS IV SOLN: INTRAVENOUS | Status: DC

## 2022-01-30 MED ORDER — INSULIN ASPART 100 UNIT/ML IJ SOLN
INTRAMUSCULAR | Status: AC
  Administered 2022-01-30: 10 [IU] via SUBCUTANEOUS
  Filled 2022-01-30: qty 1

## 2022-01-30 MED ORDER — LIDOCAINE 2% (20 MG/ML) 5 ML SYRINGE
INTRAMUSCULAR | Status: DC | PRN
  Administered 2022-01-30: 60 mg via INTRAVENOUS

## 2022-01-30 MED ORDER — INSULIN ASPART 100 UNIT/ML IJ SOLN: 10.0000 [IU] | Freq: Once | INTRAMUSCULAR | Status: AC

## 2022-01-30 SURGICAL SUPPLY — 61 items
ADAPTER CATH SYR TO TUBING 38M (ADAPTER) ×1 IMPLANT
ADPR CATH LL SYR 3/32 TPR (ADAPTER) ×1
BAG COUNTER SPONGE SURGICOUNT (BAG) ×1 IMPLANT
BAG SPNG CNTER NS LX DISP (BAG) ×1
BNDG CMPR 9X4 STRL LF SNTH (GAUZE/BANDAGES/DRESSINGS)
BNDG COHESIVE 1X5 TAN STRL LF (GAUZE/BANDAGES/DRESSINGS) IMPLANT
BNDG COHESIVE 2X5 TAN STRL LF (GAUZE/BANDAGES/DRESSINGS) IMPLANT
BNDG ELASTIC 3X5.8 VLCR STR LF (GAUZE/BANDAGES/DRESSINGS) ×1 IMPLANT
BNDG ELASTIC 4X5.8 VLCR STR LF (GAUZE/BANDAGES/DRESSINGS) ×1 IMPLANT
BNDG ESMARK 4X9 LF (GAUZE/BANDAGES/DRESSINGS) IMPLANT
BNDG GAUZE DERMACEA FLUFF 4 (GAUZE/BANDAGES/DRESSINGS) ×1 IMPLANT
BNDG GZE DERMACEA 4 6PLY (GAUZE/BANDAGES/DRESSINGS) ×1
CANNULA VESSEL 3MM 2 BLNT TIP (CANNULA) IMPLANT
CORD BIPOLAR FORCEPS 12FT (ELECTRODE) ×1 IMPLANT
COVER SURGICAL LIGHT HANDLE (MISCELLANEOUS) ×1 IMPLANT
CUFF TOURN SGL QUICK 18X4 (TOURNIQUET CUFF) IMPLANT
CUFF TOURN SGL QUICK 24 (TOURNIQUET CUFF)
CUFF TRNQT CYL 24X4X16.5-23 (TOURNIQUET CUFF) IMPLANT
DRAIN PENROSE 1/4X12 LTX STRL (WOUND CARE) IMPLANT
DRSG XEROFORM 1X8 (GAUZE/BANDAGES/DRESSINGS) IMPLANT
GAUZE PACKING IODOFORM 1/4X15 (PACKING) IMPLANT
GAUZE PAD ABD 8X10 STRL (GAUZE/BANDAGES/DRESSINGS) ×2 IMPLANT
GAUZE SPONGE 4X4 12PLY STRL (GAUZE/BANDAGES/DRESSINGS) ×1 IMPLANT
GAUZE XEROFORM 1X8 LF (GAUZE/BANDAGES/DRESSINGS) ×1 IMPLANT
GLOVE BIO SURGEON STRL SZ7.5 (GLOVE) ×1 IMPLANT
GLOVE BIOGEL PI IND STRL 8 (GLOVE) ×1 IMPLANT
GLOVE BIOGEL PI IND STRL 8.5 (GLOVE) IMPLANT
GLOVE PI ORTHO PRO STRL SZ8 (GLOVE) IMPLANT
GLOVE SURG ORTHO 8.0 STRL STRW (GLOVE) IMPLANT
GOWN STRL REUS W/ TWL LRG LVL3 (GOWN DISPOSABLE) ×1 IMPLANT
GOWN STRL REUS W/ TWL XL LVL3 (GOWN DISPOSABLE) ×1 IMPLANT
GOWN STRL REUS W/TWL LRG LVL3 (GOWN DISPOSABLE) ×1
GOWN STRL REUS W/TWL XL LVL3 (GOWN DISPOSABLE) ×1
KIT BASIN OR (CUSTOM PROCEDURE TRAY) ×1 IMPLANT
KIT TURNOVER KIT B (KITS) ×1 IMPLANT
LOOP VESSEL MAXI BLUE (MISCELLANEOUS) IMPLANT
MANIFOLD NEPTUNE II (INSTRUMENTS) IMPLANT
NDL HYPO 21X1.5 SAFETY (NEEDLE) IMPLANT
NDL HYPO 25X1 1.5 SAFETY (NEEDLE) IMPLANT
NEEDLE HYPO 21X1.5 SAFETY (NEEDLE) ×1 IMPLANT
NEEDLE HYPO 25X1 1.5 SAFETY (NEEDLE) IMPLANT
NS IRRIG 1000ML POUR BTL (IV SOLUTION) ×1 IMPLANT
PACK ORTHO EXTREMITY (CUSTOM PROCEDURE TRAY) ×1 IMPLANT
PAD ARMBOARD 7.5X6 YLW CONV (MISCELLANEOUS) ×2 IMPLANT
SET CYSTO W/LG BORE CLAMP LF (SET/KITS/TRAYS/PACK) IMPLANT
SOL PREP POV-IOD 4OZ 10% (MISCELLANEOUS) ×2 IMPLANT
SPIKE FLUID TRANSFER (MISCELLANEOUS) ×1 IMPLANT
SPLINT FINGER 4.25 BULB 911906 (SOFTGOODS) IMPLANT
SPONGE T-LAP 4X18 ~~LOC~~+RFID (SPONGE) ×1 IMPLANT
SUT ETHILON 4 0 P 3 18 (SUTURE) IMPLANT
SUT ETHILON 4 0 PS 2 18 (SUTURE) IMPLANT
SUT MON AB 5-0 P3 18 (SUTURE) IMPLANT
SWAB COLLECTION DEVICE MRSA (MISCELLANEOUS) IMPLANT
SWAB CULTURE ESWAB REG 1ML (MISCELLANEOUS) IMPLANT
SYR 20ML LL LF (SYRINGE) ×1 IMPLANT
SYR CONTROL 10ML LL (SYRINGE) IMPLANT
TOWEL GREEN STERILE (TOWEL DISPOSABLE) ×1 IMPLANT
TUBE CONNECTING 12X1/4 (SUCTIONS) ×1 IMPLANT
TUBE FEEDING ENTERAL 5FR 16IN (TUBING) IMPLANT
UNDERPAD 30X36 HEAVY ABSORB (UNDERPADS AND DIAPERS) ×1 IMPLANT
YANKAUER SUCT BULB TIP NO VENT (SUCTIONS) ×1 IMPLANT

## 2022-01-30 NOTE — Assessment & Plan Note (Signed)
-   Patient not fully compliant on CPAP per chart review - Will try to see if she allows for tonight

## 2022-01-30 NOTE — Progress Notes (Signed)
Patient arrived vitals are stable and assessment is complete at this time.  

## 2022-01-30 NOTE — Discharge Instructions (Signed)

## 2022-01-30 NOTE — Op Note (Addendum)
NAME: Rebecca Malone RECORD NO: [id number] DATE OF BIRTH: 1965-04-01 FACILITY: [facility] LOCATION: [facility] OR PHYSICIAN: Tennis Must, MD   OPERATIVE REPORT   DATE OF PROCEDURE: 01/30/22    PREOPERATIVE DIAGNOSIS: Left long finger abscess   POSTOPERATIVE DIAGNOSIS: Long finger abscess including felon paronychia and possible DIP joint   PROCEDURE: Incision and drainage left long finger abscess including DIP joint removal of nail plate for paronychia   SURGEON:  Leanora Cover, M.D.   ASSISTANT: none   ANESTHESIA:  General   INTRAVENOUS FLUIDS:  Per anesthesia flow sheet.   ESTIMATED BLOOD LOSS:  Minimal.   COMPLICATIONS:  None.   SPECIMENS: Ultras to micro   TOURNIQUET TIME:   Left arm: Approximately 15 minutes at 250 mmHg   DISPOSITION:  Stable to PACU.   INDICATIONS: 57 year old female with left long finger infection.  This has been treated with incision and drainage by the emergency department twice over the past 4 days.  She has continued pain in the finger.  She is tender to palpation of the DIP joint.  There is visible fluid underneath the skin.  I recommended incision and drainage in the operating possibly including the DIP joint.  Risks, benefits and alternatives of surgery were discussed including the risks of blood loss, infection, damage to nerves, vessels, tendons, ligaments, bone for surgery, need for additional surgery, complications with wound healing, continued pain, stiffness, , need for repeat irrigation and debridement.  She voiced understanding of these risks and elected to proceed.  OPERATIVE COURSE:  After being identified preoperatively by myself,  the patient and I agreed on the procedure and site of the procedure.  The surgical site was marked.  Surgical consent had been signed. Antibiotics were held for intraoperative cultures. She was transferred to the operating room and placed on the operating table in supine position with the Left upper extremity  on an arm board.  General anesthesia was induced by the anesthesiologist.  Left upper extremity was prepped and draped in normal sterile orthopedic fashion.  A surgical pause was performed between the surgeons, anesthesia, and operating room staff and all were in agreement as to the patient, procedure, and site of procedure.  Tourniquet at the proximal aspect of the extremity was inflated to 250 mmHg after exsanguination of the arm with an Esmarch bandage.  The previous incision from the emergency department was opened.  This was on the ulnar side of the distal phalanx.  This went across the distal phalanx with all septae open.  There is fluid tracking underneath the skin proximally on the ulnar side and over the DIP joint.  The base of the nail was exposed and elevated.  The skin over the fluctuant area was removed.  This involves mostly the epidermis with intact dermis underneath.  The dermis did appear infected at the dorsal nail fold.  The nail was removed with a freer elevator to allow better drainage.  An incision was made at the ulnar side of the DIP joint.  This was carried into subcutaneous tissues by spreading technique.  The DIP joint was entered underneath the extensor tendon.  There was fluid within the joint but not gross purulence.  The joint and felon site were copiously irrigated with sterile saline by Angiocath.  The wounds were all copiously irrigated with sterile saline.  They were then packed with quarter inch iodoform gauze.  She was given a dose of IV vancomycin.  A piece of Xeroform was placed in  the nail fold and the raw areas of skin dressed with sterile Xeroform.  The wounds were then dressed with sterile 4 x 4 and wrapped with a Coban dressing lightly.  An AlumaFoam splint was placed and wrapped lightly with Coban dressing.  Cultures were taken from the undersurface of the nail and the skin that was debrided.  A digital block was performed with quarter percent plain Marcaine to aid in  postoperative analgesia.  The the tourniquet was deflated at Roxifol-D 15 minutes.  Fingertips were pink with brisk capillary refill after deflation of tourniquet.  The operative  drapes were broken down.  The patient was awoken from anesthesia safely.  She was transferred back to the stretcher and taken to PACU in stable condition.  I will see her back in the office in 3-4 days for postoperative followup.  She will stay overnight for antibiotic coverage and as she has stay with her at home.   Leanora Cover, MD Electronically signed, 01/30/22

## 2022-01-30 NOTE — Assessment & Plan Note (Signed)
-   Uncontrolled blood pressure when seen in PACU -Check UDS given history of cocaine use; was not able to be obtained - Resume home losartan-HCTZ

## 2022-01-30 NOTE — Anesthesia Preprocedure Evaluation (Addendum)
Anesthesia Evaluation  Patient identified by MRN, date of birth, ID band Patient awake    Reviewed: Allergy & Precautions, NPO status , Patient's Chart, lab work & pertinent test results  Airway Mallampati: II  TM Distance: >3 FB Neck ROM: Full    Dental no notable dental hx.    Pulmonary asthma , sleep apnea and Continuous Positive Airway Pressure Ventilation , Current Smoker   Pulmonary exam normal        Cardiovascular hypertension, Pt. on medications and Pt. on home beta blockers + Past MI and +CHF   Rhythm:Regular Rate:Normal     Neuro/Psych negative neurological ROS  negative psych ROS   GI/Hepatic Neg liver ROS,GERD  Medicated,,  Endo/Other  diabetes, Type 2, Insulin Dependent    Renal/GU   negative genitourinary   Musculoskeletal  (+) Arthritis ,  Left finger abscess   Abdominal Normal abdominal exam  (+)   Peds  Hematology  (+) Blood dyscrasia, anemia   Anesthesia Other Findings   Reproductive/Obstetrics                             Anesthesia Physical Anesthesia Plan  ASA: 3  Anesthesia Plan: General   Post-op Pain Management:    Induction: Intravenous  PONV Risk Score and Plan: 2 and Ondansetron, Dexamethasone, Midazolam and Treatment may vary due to age or medical condition  Airway Management Planned: Mask and LMA  Additional Equipment: None  Intra-op Plan:   Post-operative Plan: Extubation in OR  Informed Consent: I have reviewed the patients History and Physical, chart, labs and discussed the procedure including the risks, benefits and alternatives for the proposed anesthesia with the patient or authorized representative who has indicated his/her understanding and acceptance.     Dental advisory given  Plan Discussed with: CRNA  Anesthesia Plan Comments:        Anesthesia Quick Evaluation
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
## 2022-01-30 NOTE — Assessment & Plan Note (Addendum)
-   has undergone DCCV in the past -She has also been counseled on use of cocaine in setting of metoprolol use although prior UDS has been positive for cocaine - Follow-up UDS - resume Eliquis - Continue metoprolol

## 2022-01-30 NOTE — Assessment & Plan Note (Addendum)
-   no s/s exacerbation; euvolemic

## 2022-01-30 NOTE — Anesthesia Procedure Notes (Signed)
Procedure Name: LMA Insertion Date/Time: 01/30/2022 3:39 PM  Performed by: Valda Favia, CRNAPre-anesthesia Checklist: Patient identified, Emergency Drugs available, Suction available and Patient being monitored Patient Re-evaluated:Patient Re-evaluated prior to induction Oxygen Delivery Method: Circle System Utilized Preoxygenation: Pre-oxygenation with 100% oxygen Induction Type: IV induction Ventilation: Mask ventilation without difficulty LMA: LMA inserted LMA Size: 4.0 Number of attempts: 1 Placement Confirmation: positive ETCO2 Tube secured with: Tape Dental Injury: Teeth and Oropharynx as per pre-operative assessment
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
## 2022-01-30 NOTE — Progress Notes (Addendum)
Postop note: Status post incision and drainage left long finger.  Wound packed.  Given dose of IV vancomycin in the operating room.  Okay for discharge home when afebrile and pain controlled.  Recommend Bactrim for antibiotic coverage and Percocet for pain given poor pain control with hydrocodone.  Follow-up in office already scheduled next week.

## 2022-01-30 NOTE — Progress Notes (Signed)
CBG 379 when patient arrived in short stay. Per Periop Glycemic Protocol patient received 12 units Novolog and Dr. Glennon Mac was informed. Per MD CBG was rechecked in 45 minutes after insulin administration and is 353. Per MD verbal order, patient received 10 units Novolog  additional dose. Will continue to monitor.

## 2022-01-30 NOTE — Assessment & Plan Note (Addendum)
-   Initially seen in the ER on 01/27/2022 after sustaining left finger injury from being closed in a car door approximately 3 days prior; had undergone I&D in the ER on 1/2 and discharged home with Keflex, doxycycline, and pain control.  She presented back to the ER on 01/28/2022 with worsening pain and ongoing swelling.  She underwent repeat I&D with extension of the incision and left open for draining.  She then followed up with Dr. Fredna Dow on 01/30/2022 and was recommended for further I&D in the OR -Continue current surgical bandage in place until outpatient follow-up with Dr. Fredna Dow -Will start Bactrim and continue course at discharge -Continue pain control

## 2022-01-30 NOTE — Hospital Course (Signed)
Rebecca Malone is a 57 yo female with PMH HTN, DM II, PAF, chronic diastolic CHF, substance use (cocaine) obesity, OSA, asthma, GERD.  She presented to Dr. Fredna Dow office this morning for follow-up after having been seen in the ER twice the past 2 days for a left finger injury.  She underwent I&D in the ER on 01/27/2022 and 01/28/2022.  Due to nonimprovement and worsening pain/swelling, she underwent further debridement in the OR on 01/30/2022. She was monitored overnight and able to be discharged home the following morning.  She was prescribed Bactrim to complete 7-day course and will follow-up outpatient with Dr. Fredna Dow.

## 2022-01-30 NOTE — Transfer of Care (Signed)
Immediate Anesthesia Transfer of Care Note  Patient: Rebecca Malone  Procedure(s) Performed: IRRIGATION AND DEBRIDEMENT LEFT LONG FINGER (Left)  Patient Location: PACU  Anesthesia Type:General  Level of Consciousness: drowsy  Airway & Oxygen Therapy: Patient Spontanous Breathing and Patient connected to face mask oxygen  Post-op Assessment: Report given to RN and Post -op Vital signs reviewed and stable  Post vital signs: Reviewed and stable  Last Vitals:  Vitals Value Taken Time  BP 194/82 01/30/22 1637  Temp    Pulse 65 01/30/22 1645  Resp 21 01/30/22 1645  SpO2 98 % 01/30/22 1645  Vitals shown include unvalidated device data.  Last Pain:  Vitals:   01/30/22 1346  TempSrc:   PainSc: 10-Worst pain ever      Patients Stated Pain Goal: 0 (58/30/94 0768)  Complications: No notable events documented.

## 2022-01-30 NOTE — Assessment & Plan Note (Addendum)
-   resume home regimen at discharge

## 2022-01-31 DIAGNOSIS — L03012 Cellulitis of left finger: Secondary | ICD-10-CM | POA: Diagnosis not present

## 2022-01-31 DIAGNOSIS — I5032 Chronic diastolic (congestive) heart failure: Secondary | ICD-10-CM | POA: Diagnosis not present

## 2022-01-31 DIAGNOSIS — L02512 Cutaneous abscess of left hand: Secondary | ICD-10-CM | POA: Diagnosis not present

## 2022-01-31 DIAGNOSIS — Z79899 Other long term (current) drug therapy: Secondary | ICD-10-CM | POA: Diagnosis not present

## 2022-01-31 DIAGNOSIS — Z794 Long term (current) use of insulin: Secondary | ICD-10-CM | POA: Diagnosis not present

## 2022-01-31 DIAGNOSIS — I110 Hypertensive heart disease with heart failure: Secondary | ICD-10-CM | POA: Diagnosis not present

## 2022-01-31 DIAGNOSIS — E119 Type 2 diabetes mellitus without complications: Secondary | ICD-10-CM | POA: Diagnosis not present

## 2022-01-31 DIAGNOSIS — F17210 Nicotine dependence, cigarettes, uncomplicated: Secondary | ICD-10-CM | POA: Diagnosis not present

## 2022-01-31 DIAGNOSIS — J45909 Unspecified asthma, uncomplicated: Secondary | ICD-10-CM | POA: Diagnosis not present

## 2022-01-31 DIAGNOSIS — Z7901 Long term (current) use of anticoagulants: Secondary | ICD-10-CM | POA: Diagnosis not present

## 2022-01-31 DIAGNOSIS — I480 Paroxysmal atrial fibrillation: Secondary | ICD-10-CM | POA: Diagnosis not present

## 2022-01-31 LAB — GLUCOSE, CAPILLARY: Glucose-Capillary: 373 mg/dL — ABNORMAL HIGH (ref 70–99)

## 2022-01-31 MED ORDER — OXYCODONE HCL 5 MG PO TABS: 5.0000 mg | ORAL_TABLET | ORAL | 0 refills | Status: AC | PRN

## 2022-01-31 MED ORDER — SULFAMETHOXAZOLE-TRIMETHOPRIM 800-160 MG PO TABS: 1.0000 | ORAL_TABLET | Freq: Two times a day (BID) | ORAL | 0 refills | Status: AC
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
## 2022-02-01 ENCOUNTER — Encounter (HOSPITAL_COMMUNITY): Payer: Self-pay | Admitting: Orthopedic Surgery

## 2022-02-02 ENCOUNTER — Telehealth (HOSPITAL_BASED_OUTPATIENT_CLINIC_OR_DEPARTMENT_OTHER): Payer: Self-pay | Admitting: Orthopedic Surgery

## 2022-02-02 LAB — AEROBIC CULTURE W GRAM STAIN (SUPERFICIAL SPECIMEN): Gram Stain: NONE SEEN

## 2022-02-02 NOTE — Telephone Encounter (Signed)
Attempted to call patient at listed phone number regarding culture results.  Call went to voicemail.  Patient has appt in office tomorrow morning.

## 2022-02-02 NOTE — Anesthesia Postprocedure Evaluation (Signed)
Anesthesia Post Note  Patient: Rebecca Malone  Procedure(s) Performed: IRRIGATION AND DEBRIDEMENT LEFT LONG FINGER (Left)     Patient location during evaluation: PACU Anesthesia Type: General Level of consciousness: awake and alert Pain management: pain level controlled Vital Signs Assessment: post-procedure vital signs reviewed and stable Respiratory status: spontaneous breathing, nonlabored ventilation, respiratory function stable and patient connected to nasal cannula oxygen Cardiovascular status: blood pressure returned to baseline and stable Postop Assessment: no apparent nausea or vomiting Anesthetic complications: no  No notable events documented.  Last Vitals:  Vitals:   01/31/22 0610 01/31/22 0734  BP: 125/79 (!) 161/95  Pulse: 67 63  Resp: 20 20  Temp: 36.8 C 36.7 C  SpO2: 98% 92%    Last Pain:  Vitals:   01/31/22 0734  TempSrc: Oral  PainSc: 0-No pain   Pain Goal: Patients Stated Pain Goal: 0 (01/30/22 1346)                 Kingstin Heims L Lynasia Meloche

## 2022-02-03 DIAGNOSIS — M25642 Stiffness of left hand, not elsewhere classified: Secondary | ICD-10-CM | POA: Diagnosis not present

## 2022-02-03 DIAGNOSIS — T148XXA Other injury of unspecified body region, initial encounter: Secondary | ICD-10-CM | POA: Diagnosis not present

## 2022-02-04 LAB — ANAEROBIC CULTURE W GRAM STAIN: Gram Stain: NONE SEEN

## 2022-02-10 DIAGNOSIS — T148XXA Other injury of unspecified body region, initial encounter: Secondary | ICD-10-CM | POA: Diagnosis not present

## 2022-02-10 DIAGNOSIS — M25642 Stiffness of left hand, not elsewhere classified: Secondary | ICD-10-CM | POA: Diagnosis not present

## 2022-02-10 DIAGNOSIS — M79645 Pain in left finger(s): Secondary | ICD-10-CM | POA: Diagnosis not present

## 2022-03-03 LAB — FUNGUS CULTURE WITH STAIN

## 2022-03-03 LAB — FUNGAL ORGANISM REFLEX

## 2022-03-03 LAB — FUNGUS CULTURE RESULT

## 2022-10-04 NOTE — Progress Notes (Unsigned)
Cardiology Clinic Note   Date: 10/04/2022 ID: JERALDIN BETTERIDGE, DOB [birth year], MRN [id number]  Primary Cardiologist:  Thurmon Fair, MD  Patient Profile    Rebecca Malone is a 57 y.o. female who presents to the clinic today for ***    Past medical history significant for: Chronic diastolic heart failure. Echo 10/26/2021: EF 60 to 65%.  No RWMA.  Mild LVH.  Grade I DD.  Normal RV function.  Normal PA pressure.  No significant valvular abnormalities.  No significant changes from prior study April 2021. PAF. Onset April 2021. DCCV in ED September 2023. Hypertension. Hyperlipidemia. T2DM. Asthma. OSA. Tobacco abuse. Cocaine abuse.     History of Present Illness    Rebecca Malone is a longtime patient of cardiology.  She is followed by Dr. Royann Shivers for the above outlined history.  In summary, patient has been followed for recurrent palpitations.  Cardiac event monitor August 2019 did not show significant arrhythmias.  She was diagnosed with A-fib in April 2021 during hospital admission.  She was positive for cocaine use at that time.  She was started on anticoagulation.  Echo showed hyperdynamic LV function, LVH, Grade II DD.  Nuclear stress test 2014 was negative for ischemia.  She was seen in the ED on 10/25/2021 for A-fib with RVR, HR 150s.  UDS was positive for cocaine at that time.  CTA negative for PE but showed moderate to severe posterior left upper lobe and bilateral lower lobe atelectasis and/or infiltrate.  She had reportedly stopped taking home medications.  She was restarted on home metoprolol and Eliquis and treated with prednisone and Augmentin for suspected aspiration pneumonia.  She was referred to endocrinology for poorly controlled diabetes.  Patient was last seen in the office by Bernadene Person, NP on 10/30/2021 for hospital follow-up.  She reported fleeting palpitations at that time.  Her BP was elevated at the time of her visit.  Reported nonadherence to CPAP secondary  to intolerance to mask.  She was referred to Dr. Mayford Knife for further evaluation.  Today, patient ***  Chronic diastolic heart failure.  Echo October 2023 showed normal LV/RV function, Grade I DD.  Patient*** Euvolemic and well compensated on exam.  Continue metoprolol, Hyzaar. PAF.  Emergent DCCV in ED September 2023.  Patient*** Denies spontaneous bleeding concerns.  Continue metoprolol, Eliquis. Hypertension. BP today *** Patient denies headaches, dizziness or vision changes. Continue metoprolol, Hyzaar. Tobacco abuse.*** Cocaine abuse.*** OSA.***   ROS: All other systems reviewed and are otherwise negative except as noted in History of Present Illness.  Studies Reviewed       ***  Risk Assessment/Calculations    {Does this patient have ATRIAL FIBRILLATION?:[phone]} No BP recorded.  {Refresh Note OR Click here to enter BP  :1}***        Physical Exam    VS:  There were no vitals taken for this visit. , BMI There is no height or weight on file to calculate BMI.  GEN: Well nourished, well developed, in no acute distress. Neck: No JVD or carotid bruits. Cardiac: *** RRR. No murmurs. No rubs or gallops.   Respiratory:  Respirations regular and unlabored. Clear to auscultation without rales, wheezing or rhonchi. GI: Soft, nontender, nondistended. Extremities: Radials/DP/PT 2+ and equal bilaterally. No clubbing or cyanosis. No edema ***  Skin: Warm and dry, no rash. Neuro: Strength intact.  Assessment & Plan   ***  Disposition: ***     {Are you ordering a CV Procedure (e.g.  stress test, cath, DCCV, TEE, etc)?   Press F2        :[id number]}   Signed, Etta Grandchild. Braden Deloach, DNP, NP-C

## 2022-10-06 ENCOUNTER — Other Ambulatory Visit: Payer: Self-pay | Admitting: Student

## 2022-10-06 ENCOUNTER — Ambulatory Visit: Payer: 59 | Attending: Student | Admitting: Student

## 2022-10-06 ENCOUNTER — Ambulatory Visit: Payer: 59 | Attending: Student

## 2022-10-06 ENCOUNTER — Encounter: Payer: Self-pay | Admitting: Student

## 2022-10-06 VITALS — BP 150/80 | HR 72 | Ht 65.0 in | Wt 212.4 lb

## 2022-10-06 DIAGNOSIS — Z794 Long term (current) use of insulin: Secondary | ICD-10-CM | POA: Diagnosis not present

## 2022-10-06 DIAGNOSIS — I5032 Chronic diastolic (congestive) heart failure: Secondary | ICD-10-CM

## 2022-10-06 DIAGNOSIS — R002 Palpitations: Secondary | ICD-10-CM | POA: Diagnosis not present

## 2022-10-06 DIAGNOSIS — E119 Type 2 diabetes mellitus without complications: Secondary | ICD-10-CM | POA: Diagnosis not present

## 2022-10-06 DIAGNOSIS — R0609 Other forms of dyspnea: Secondary | ICD-10-CM

## 2022-10-06 DIAGNOSIS — I480 Paroxysmal atrial fibrillation: Secondary | ICD-10-CM

## 2022-10-06 DIAGNOSIS — I10 Essential (primary) hypertension: Secondary | ICD-10-CM

## 2022-10-06 DIAGNOSIS — Z720 Tobacco use: Secondary | ICD-10-CM | POA: Diagnosis not present

## 2022-10-06 DIAGNOSIS — G4733 Obstructive sleep apnea (adult) (pediatric): Secondary | ICD-10-CM

## 2022-10-06 MED ORDER — LOSARTAN POTASSIUM-HCTZ 50-12.5 MG PO TABS: 1.0000 | ORAL_TABLET | Freq: Every day | ORAL | 1 refills | Status: DC

## 2022-10-06 MED ORDER — METOPROLOL SUCCINATE ER 50 MG PO TB24: 50.0000 mg | ORAL_TABLET | Freq: Every day | ORAL | 1 refills | Status: AC

## 2022-10-06 MED ORDER — APIXABAN 5 MG PO TABS: 5.0000 mg | ORAL_TABLET | Freq: Two times a day (BID) | ORAL | 1 refills | Status: DC

## 2022-10-06 MED ORDER — ROSUVASTATIN CALCIUM 20 MG PO TABS: 20.0000 mg | ORAL_TABLET | Freq: Every day | ORAL | 1 refills | Status: DC

## 2022-10-06 NOTE — Progress Notes (Unsigned)
Enrolled patient for a 14 day Zio XT monitor to be mailed to patients home.   [place] to read

## 2022-10-06 NOTE — Patient Instructions (Addendum)
Medication Instructions:  RESTART ELIQUIS, HYZAAR ,CRESTOR STAR TOPROL 50MG  DAILY *If you need a refill on your cardiac medications before your next appointment, please call your pharmacy*   Lab Work: CBC, CMP, MAG, TSH If you have labs (blood work) drawn today and your tests are completely normal, you will receive your results only by: MyChart Message (if you have MyChart) OR A paper copy in the mail If you have any lab test that is abnormal or we need to change your treatment, we will call you to review the results.   Testing/Procedures: Your physician has requested that you have an echocardiogram. Echocardiography is a painless test that uses sound waves to create images of your heart. It provides your doctor with information about the size and shape of your heart and how well your heart's chambers and valves are working. This procedure takes approximately one hour. There are no restrictions for this procedure. Please do NOT wear cologne, perfume, aftershave, or lotions (deodorant is allowed).  Please arrive 15 minutes prior to your appointment time.   ZIO XT- Long Term Monitor Instructions   Your physician has requested you wear your ZIO patch 14.   This is a single patch monitor.  [employer] supplies one patch monitor per enrollment.  Additional stickers are not available.   Please do not apply patch if you will be having a Nuclear Stress Test, Echocardiogram, Cardiac CT, MRI, or Chest Xray during the time frame you would be wearing the monitor. The patch cannot be worn during these tests.  You cannot remove and re-apply the ZIO XT patch monitor.   Your ZIO patch monitor will be sent USPS Priority mail from [facility] directly to your home address. The monitor may also be mailed to a PO BOX if home delivery is not available.   It may take 3-5 days to receive your monitor after you have been enrolled.   Once you have received you monitor, please review enclosed instructions.   Your monitor has already been registered assigning a specific monitor serial # to you.   Applying the monitor   Shave hair from upper left chest.   Hold abrader disc by orange tab.  Rub abrader in 40 strokes over left upper chest as indicated in your monitor instructions.   Clean area with 4 enclosed alcohol pads .  Use all pads to assure are is cleaned thoroughly.  Let dry.   Apply patch as indicated in monitor instructions.  Patch will be place under collarbone on left side of chest with arrow pointing upward.   Rub patch adhesive wings for 2 minutes.Remove white label marked "1".  Remove white label marked "2".  Rub patch adhesive wings for 2 additional minutes.   While looking in a mirror, press and release button in center of patch.  A small green light will flash 3-4 times .  This will be your only indicator the monitor has been turned on.     Do not shower for the first 24 hours.  You may shower after the first 24 hours.   Press button if you feel a symptom. You will hear a small click.  Record Date, Time and Symptom in the Patient Log Book.   When you are ready to remove patch, follow instructions on last 2 pages of Patient Log Book.  Stick patch monitor onto last page of Patient Log Book.   Place Patient Log Book in [place] box.  Use locking tab on box and tape box  closed securely.  The [employer] and [employer] has [employer] on it.  Please place in mailbox as soon as possible.  Your physician should have your test results approximately 7 days after the monitor has been mailed back to [facility].   Call [facility] Customer Care at [phone] if you have questions regarding your ZIO XT patch monitor.  Call them immediately if you see an orange light blinking on your monitor.   If your monitor falls off in less than 4 days contact our Monitor department at [phone].  If your monitor becomes loose or falls off after 4 days call [employer] at [phone] for suggestions on  securing your monitor.    Follow-Up: At [facility], you and your health needs are our priority.  As part of our continuing mission to provide you with exceptional heart care, we have created designated Provider Care Teams.  These Care Teams include your primary Cardiologist (physician) and Advanced Practice Providers (APPs -  Physician Assistants and Nurse Practitioners) who all work together to provide you with the care you need, when you need it.  We recommend signing up for the patient portal called "MyChart".  Sign up information is provided on this After Visit Summary.  MyChart is used to connect with patients for Virtual Visits (Telemedicine).  Patients are able to view lab/test results, encounter notes, upcoming appointments, etc.  Non-urgent messages can be sent to your provider as well.   To learn more about what you can do with MyChart, go to [email or web address].    Your next appointment:   8-10 week(s)  Provider:   Thurmon Fair, MD  OR APP

## 2022-10-07 LAB — CBC
Hematocrit: 44.3 % (ref 34.0–46.6)
Hemoglobin: 13.6 g/dL (ref 11.1–15.9)
MCH: 27.1 pg (ref 26.6–33.0)
MCHC: 30.7 g/dL — ABNORMAL LOW (ref 31.5–35.7)
MCV: 88 fL (ref 79–97)
Platelets: 263 10*3/uL (ref 150–450)
RBC: 5.01 x10E6/uL (ref 3.77–5.28)
RDW: 13.1 % (ref 11.7–15.4)
WBC: 5.6 10*3/uL (ref 3.4–10.8)

## 2022-10-07 LAB — COMPREHENSIVE METABOLIC PANEL
ALT: 11 IU/L (ref 0–32)
AST: 13 IU/L (ref 0–40)
Albumin: 4.2 g/dL (ref 3.8–4.9)
Alkaline Phosphatase: 107 IU/L (ref 44–121)
BUN/Creatinine Ratio: 8 — ABNORMAL LOW (ref 9–23)
BUN: 7 mg/dL (ref 6–24)
Bilirubin Total: 0.5 mg/dL (ref 0.0–1.2)
CO2: 26 mmol/L (ref 20–29)
Calcium: 9.5 mg/dL (ref 8.7–10.2)
Chloride: 103 mmol/L (ref 96–106)
Creatinine, Ser: 0.91 mg/dL (ref 0.57–1.00)
Globulin, Total: 3 g/dL (ref 1.5–4.5)
Glucose: 233 mg/dL — ABNORMAL HIGH (ref 70–99)
Potassium: 4 mmol/L (ref 3.5–5.2)
Sodium: 140 mmol/L (ref 134–144)
Total Protein: 7.2 g/dL (ref 6.0–8.5)
eGFR: 74 mL/min/{1.73_m2} (ref >59)

## 2022-10-07 LAB — MAGNESIUM: Magnesium: 2 mg/dL (ref 1.6–2.3)

## 2022-10-07 LAB — TSH: TSH: 0.854 u[IU]/mL (ref 0.450–4.500)

## 2022-10-14 ENCOUNTER — Telehealth: Payer: Self-pay | Admitting: Cardiovascular Disease

## 2022-10-14 NOTE — Telephone Encounter (Signed)
*  STAT* If patient is at the pharmacy, call can be transferred to refill team.   1. Which medications need to be refilled? (please list name of each medication and dose if known) he needs a new prescription- it was written by another doctor- she said she was told that her Cardiologist could write this-  Insulin Lispro Propamine 75/25 Insulin Quick Pen- RX# [id number] and the needles to go with the pen   2. Would you like to learn more about the convenience, safety, & potential cost savings by using the [facility]?    3. Are you open to using the [facility] (Type [facility].   4. Which pharmacy/location (including street and city if local pharmacy) is medication to be sent to?[facility] [address], [place]   5. Do they need a 30 day or 90 day supply? Need this called in asap- sugar is high

## 2022-10-14 NOTE — Telephone Encounter (Signed)
Called pt to inform her that she needed to contact her PCP for a refill on her medication insulin Lispro and the needles, because this is not a cardiac medication. I advised the pt that if she has any other cardiac problems, questions or concerns, to give our office a call back. Pt verbalized understanding.

## 2022-10-20 ENCOUNTER — Ambulatory Visit (HOSPITAL_COMMUNITY): Payer: 59

## 2022-11-04 ENCOUNTER — Ambulatory Visit (HOSPITAL_COMMUNITY): Payer: 59 | Attending: Student

## 2022-11-05 ENCOUNTER — Encounter (HOSPITAL_COMMUNITY): Payer: Self-pay | Admitting: Student

## 2022-12-16 ENCOUNTER — Ambulatory Visit: Payer: 59 | Attending: Cardiovascular Disease | Admitting: Cardiovascular Disease

## 2023-02-12 ENCOUNTER — Telehealth: Payer: Self-pay | Admitting: Cardiovascular Disease

## 2023-02-12 NOTE — Telephone Encounter (Signed)
disregard

## 2023-09-25 ENCOUNTER — Emergency Department (HOSPITAL_COMMUNITY)

## 2023-09-25 ENCOUNTER — Other Ambulatory Visit: Payer: Self-pay

## 2023-09-25 ENCOUNTER — Emergency Department (HOSPITAL_COMMUNITY)
Admission: EM | Admit: 2023-09-25 | Discharge: 2023-09-25 | Disposition: A | Discharge status: Home / Self Care | Attending: Emergency Medicine | Admitting: Emergency Medicine

## 2023-09-25 ENCOUNTER — Encounter (HOSPITAL_COMMUNITY): Payer: Self-pay

## 2023-09-25 DIAGNOSIS — R29818 Other symptoms and signs involving the nervous system: Secondary | ICD-10-CM | POA: Diagnosis not present

## 2023-09-25 DIAGNOSIS — I10 Essential (primary) hypertension: Secondary | ICD-10-CM | POA: Diagnosis not present

## 2023-09-25 DIAGNOSIS — R531 Weakness: Secondary | ICD-10-CM | POA: Insufficient documentation

## 2023-09-25 DIAGNOSIS — R202 Paresthesia of skin: Secondary | ICD-10-CM | POA: Diagnosis not present

## 2023-09-25 DIAGNOSIS — R939 Diagnostic imaging inconclusive due to excess body fat of patient: Secondary | ICD-10-CM | POA: Diagnosis not present

## 2023-09-25 DIAGNOSIS — Z91148 Patient's other noncompliance with medication regimen for other reason: Secondary | ICD-10-CM | POA: Diagnosis not present

## 2023-09-25 DIAGNOSIS — R7989 Other specified abnormal findings of blood chemistry: Secondary | ICD-10-CM | POA: Diagnosis not present

## 2023-09-25 DIAGNOSIS — R9431 Abnormal electrocardiogram [ECG] [EKG]: Secondary | ICD-10-CM | POA: Insufficient documentation

## 2023-09-25 DIAGNOSIS — Z91199 Patient's noncompliance with other medical treatment and regimen due to unspecified reason: Secondary | ICD-10-CM | POA: Insufficient documentation

## 2023-09-25 DIAGNOSIS — Z79899 Other long term (current) drug therapy: Secondary | ICD-10-CM | POA: Insufficient documentation

## 2023-09-25 DIAGNOSIS — R0602 Shortness of breath: Secondary | ICD-10-CM | POA: Insufficient documentation

## 2023-09-25 DIAGNOSIS — E1165 Type 2 diabetes mellitus with hyperglycemia: Secondary | ICD-10-CM | POA: Diagnosis not present

## 2023-09-25 DIAGNOSIS — I119 Hypertensive heart disease without heart failure: Secondary | ICD-10-CM | POA: Insufficient documentation

## 2023-09-25 DIAGNOSIS — R739 Hyperglycemia, unspecified: Secondary | ICD-10-CM | POA: Diagnosis not present

## 2023-09-25 DIAGNOSIS — Z794 Long term (current) use of insulin: Secondary | ICD-10-CM | POA: Diagnosis not present

## 2023-09-25 DIAGNOSIS — R030 Elevated blood-pressure reading, without diagnosis of hypertension: Secondary | ICD-10-CM

## 2023-09-25 LAB — COMPREHENSIVE METABOLIC PANEL WITH GFR
ALT: 14 U/L (ref 0–44)
AST: 18 U/L (ref 15–41)
Albumin: 4 g/dL (ref 3.5–5.0)
Alkaline Phosphatase: 97 U/L (ref 38–126)
Anion gap: 11 (ref 5–15)
BUN: 13 mg/dL (ref 6–20)
CO2: 22 mmol/L (ref 22–32)
Calcium: 9.3 mg/dL (ref 8.9–10.3)
Chloride: 106 mmol/L (ref 98–111)
Creatinine, Ser: 0.85 mg/dL (ref 0.44–1.00)
GFR, Estimated: >60 mL/min (ref >60)
Glucose, Bld: 148 mg/dL — ABNORMAL HIGH (ref 70–99)
Potassium: 3.9 mmol/L (ref 3.5–5.1)
Sodium: 139 mmol/L (ref 135–145)
Total Bilirubin: 0.7 mg/dL (ref 0.0–1.2)
Total Protein: 7.3 g/dL (ref 6.5–8.1)

## 2023-09-25 LAB — I-STAT CHEM 8, ED
BUN: 15 mg/dL (ref 6–20)
Calcium, Ion: 1.18 mmol/L (ref 1.15–1.40)
Chloride: 106 mmol/L (ref 98–111)
Creatinine, Ser: 1 mg/dL (ref 0.44–1.00)
Glucose, Bld: 150 mg/dL — ABNORMAL HIGH (ref 70–99)
HCT: 41 % (ref 36.0–46.0)
Hemoglobin: 13.9 g/dL (ref 12.0–15.0)
Potassium: 3.8 mmol/L (ref 3.5–5.1)
Sodium: 140 mmol/L (ref 135–145)
TCO2: 22 mmol/L (ref 22–32)

## 2023-09-25 LAB — CBC WITH DIFFERENTIAL/PLATELET
Abs Immature Granulocytes: 0.01 K/uL (ref 0.00–0.07)
Basophils Absolute: 0 K/uL (ref 0.0–0.1)
Basophils Relative: 1 %
Eosinophils Absolute: 0.2 K/uL (ref 0.0–0.5)
Eosinophils Relative: 3 %
HCT: 41.7 % (ref 36.0–46.0)
Hemoglobin: 12.5 g/dL (ref 12.0–15.0)
Immature Granulocytes: 0 %
Lymphocytes Relative: 33 %
Lymphs Abs: 2 K/uL (ref 0.7–4.0)
MCH: 26.5 pg (ref 26.0–34.0)
MCHC: 30 g/dL (ref 30.0–36.0)
MCV: 88.3 fL (ref 80.0–100.0)
Monocytes Absolute: 0.4 K/uL (ref 0.1–1.0)
Monocytes Relative: 7 %
Neutro Abs: 3.3 K/uL (ref 1.7–7.7)
Neutrophils Relative %: 56 %
Platelets: 228 K/uL (ref 150–400)
RBC: 4.72 MIL/uL (ref 3.87–5.11)
RDW: 14.2 % (ref 11.5–15.5)
WBC: 5.9 K/uL (ref 4.0–10.5)
nRBC: 0 % (ref 0.0–0.2)

## 2023-09-25 LAB — PROTIME-INR
INR: 0.9 (ref 0.8–1.2)
Prothrombin Time: 13 s (ref 11.4–15.2)

## 2023-09-25 LAB — TROPONIN T, HIGH SENSITIVITY: Troponin T High Sensitivity: 16 ng/L (ref 0–19)

## 2023-09-25 LAB — APTT: aPTT: 35 s (ref 24–36)

## 2023-09-25 LAB — ETHANOL: Alcohol, Ethyl (B): <15 mg/dL (ref <15)

## 2023-09-25 LAB — PRO BRAIN NATRIURETIC PEPTIDE: Pro Brain Natriuretic Peptide: 1166 pg/mL — ABNORMAL HIGH (ref <300.0)

## 2023-09-25 LAB — CBG MONITORING, ED: Glucose-Capillary: 108 mg/dL — ABNORMAL HIGH (ref 70–99)

## 2023-09-25 MED ORDER — SODIUM CHLORIDE 0.9% FLUSH
3.0000 mL | Freq: Once | INTRAVENOUS | Status: AC
  Administered 2023-09-25: 3 mL via INTRAVENOUS

## 2023-09-25 MED ORDER — ROSUVASTATIN CALCIUM 20 MG PO TABS: 20.0000 mg | ORAL_TABLET | Freq: Every day | ORAL | 1 refills | Status: AC

## 2023-09-25 MED ORDER — ALBUTEROL SULFATE HFA 108 (90 BASE) MCG/ACT IN AERS: 1.0000 | INHALATION_SPRAY | Freq: Four times a day (QID) | RESPIRATORY_TRACT | 0 refills | Status: AC | PRN

## 2023-09-25 MED ORDER — INSULIN LISPRO PROT & LISPRO (75-25 MIX) 100 UNIT/ML KWIKPEN: 30.0000 [IU] | PEN_INJECTOR | Freq: Two times a day (BID) | SUBCUTANEOUS | 0 refills | Status: AC

## 2023-09-25 MED ORDER — APIXABAN 5 MG PO TABS: 5.0000 mg | ORAL_TABLET | Freq: Two times a day (BID) | ORAL | 1 refills | Status: AC

## 2023-09-25 MED ORDER — LOSARTAN POTASSIUM-HCTZ 50-12.5 MG PO TABS: 1.0000 | ORAL_TABLET | Freq: Every day | ORAL | 1 refills | Status: AC

## 2023-09-25 NOTE — ED Triage Notes (Signed)
 Patient comes in with several complaints.  Patient reports sob because she doesn't have her inhaler.  She also reports hyperglycemia bc she doesn't have her insulin .  Patient also reports when she woke up this morning she couldn't move her left arm and got in the shower and left the hot water run over it and she got pins and needle feelings in that arm.  Reports her grip is weaker and left leg she feels like she is dragging.  No asymmetry in face or speech deficits.  Patient can't give a last known well because she said her arm symptoms started on Thursday early Friday morning.

## 2023-09-25 NOTE — Discharge Instructions (Addendum)
 It was our pleasure to provide your ER care today - we hope that you feel better.  Make sure to  take your meds as prescribed by your doctor. Follow diabetes, heart healthy, eating plan. Check your blood sugars 4x/day (before meals and at bedtime) and record values. Use albuterol  inhaler as need. Avoid any smoking.   Follow up closely with primary care doctor in the coming week. Have your blood pressure and blood sugar rechecked then, and bring a copy of your home blood sugars.   Return to ER if worse, new symptoms, fevers, chest pain, trouble breathing, new numbness/weakness, change in speech or vision, or other concern.

## 2023-09-25 NOTE — ED Provider Notes (Addendum)
 Patient care was taken over from Dr. Bernard.  Patient presented with some left arm and left leg tingling with weakness.  She was pending MRI.  MRI does not show any acute abnormality.  Her labs show mildly elevated pro BNP.  Her chest x-ray does not show any pulmonary edema.  She does not have any ongoing shortness of breath.  Currently she is asymptomatic.  She is out of most of her medications.  I reviewed her prior notes.  She is on Eliquis  for atrial fibrillation.  She says she is out of this as well as her blood pressure medication and her insulin .  Her blood pressure is noted to be elevated but currently she is asymptomatic.  Will restart her insulin , her losartan , her Eliquis  and her albuterol .  She was counseled to follow-up with her cardiologist regarding her cardiac medications.  She is in the process of trying to establish care with a primary care doctor.  She was discharged in good condition.  Return precautions were given.   Lenor Hollering, MD 09/25/23 [id number]    Lenor Hollering, MD 09/25/23 [phone]

## 2023-09-25 NOTE — ED Provider Notes (Signed)
 Rebecca Malone EMERGENCY DEPARTMENT AT [facility] Provider Note   CSN: [id number] Arrival date & time: 09/25/23  1257     Patient presents with: Neurologic Problem and Hyperglycemia   Rebecca Malone is a 58 y.o. female.   Pt c/o several c/o. Pt indicates ran out of albuterol  inhaler and her insulin , and so wants to be checked for that. She also indicates awoke this AM and felt her left arm was weak and then noted that he left hand and left leg also felt weak and felt tingling. Denies hx same. Has felt fine, at baseline, Friday AM (?> 24 hours ago). No change in speech or vision. No headache. No neck/back pain or radicular pain.  No fever or chills.   The history is provided by the patient and medical records.  Neurologic Problem Associated symptoms include shortness of breath. Pertinent negatives include no chest pain, no abdominal pain and no headaches.  Hyperglycemia Associated symptoms: shortness of breath and weakness   Associated symptoms: no abdominal pain, no chest pain, no dysuria, no fever, no increased thirst, no polyuria and no vomiting   Shortness of Breath Associated symptoms: no abdominal pain, no chest pain, no cough, no fever, no headaches, no neck pain, no sore throat and no vomiting        Prior to Admission medications   Medication Sig Start Date End Date Taking? Authorizing Provider  albuterol  (PROVENTIL ) (2.5 MG/3ML) 0.083% nebulizer solution Take 3 mLs (2.5 mg total) by nebulization every 4 (four) hours as needed for wheezing or shortness of breath. Patient not taking: Reported on 10/06/2022 11/10/21 11/10/22  Flint Sonny POUR, PA-C  apixaban  (ELIQUIS ) 5 MG TABS tablet Take 1 tablet (5 mg total) by mouth 2 (two) times daily. 10/06/22 01/04/23  Loistine Sober, NP  blood glucose meter kit and supplies KIT Dispense based on patient and insurance preference. Use up to four times daily as directed. Patient not taking: Reported on 10/06/2022 10/27/21    Trixie Nilda HERO, MD  Insulin  Lispro Prot & Lispro (HUMALOG  75/25 MIX) (75-25) 100 UNIT/ML Kwikpen Inject 30 Units into the skin 2 (two) times daily. Patient not taking: Reported on 10/06/2022 10/29/21   [provider]  Insulin  Pen Needle (PEN NEEDLES) 31G X 5 MM MISC 1 each by Does not apply route in the morning and at bedtime. Patient not taking: Reported on 10/06/2022 10/27/21   Gherghe, Costin M, MD  losartan -hydrochlorothiazide  (HYZAAR) 50-12.5 MG tablet Take 1 tablet by mouth daily. 10/06/22   Loistine Sober, NP  metoprolol  succinate (TOPROL  XL) 50 MG 24 hr tablet Take 1 tablet (50 mg total) by mouth daily. Take with or immediately following a meal. 10/06/22   Loistine Sober, NP  naproxen  sodium (ALEVE ) 220 MG tablet Take 220 mg by mouth daily as needed (pain). Patient not taking: Reported on 10/06/2022    [provider]  oxyCODONE  (OXY IR/ROXICODONE ) 5 MG immediate release tablet Take 1-2 tablets (5-10 mg total) by mouth every 4 (four) hours as needed for severe pain or breakthrough pain. Patient not taking: Reported on 10/06/2022 01/31/22   Patsy Lenis, MD  rosuvastatin  (CRESTOR ) 20 MG tablet Take 1 tablet (20 mg total) by mouth daily. 10/06/22 01/04/23  Loistine Sober, NP    Allergies: Advair diskus [fluticasone -salmeterol], Other, and Pork-derived products    Review of Systems  Constitutional:  Negative for chills and fever.  HENT:  Negative for sore throat and trouble swallowing.   Eyes:  Negative for visual disturbance.  Respiratory:  Positive for shortness of breath. Negative for cough.   Cardiovascular:  Negative for chest pain, palpitations and leg swelling.  Gastrointestinal:  Negative for abdominal pain, diarrhea and vomiting.  Endocrine: Negative for polydipsia and polyuria.  Genitourinary:  Negative for dysuria and flank pain.  Musculoskeletal:  Negative for back pain and neck pain.  Neurological:  Positive for weakness. Negative for speech  difficulty and headaches.    Updated Vital Signs BP (!) 160/103 (BP Location: Left Arm)   Pulse 77   Temp 97.6 F (36.4 C) (Oral)   Resp 16   Ht 1.651 m (5' 5)   Wt 96.2 kg   SpO2 94%   BMI 35.28 kg/m   Physical Exam Vitals and nursing note reviewed.  Constitutional:      Appearance: Normal appearance. She is well-developed.  HENT:     Head: Atraumatic.     Nose: Nose normal.     Mouth/Throat:     Mouth: Mucous membranes are moist.     Pharynx: Oropharynx is clear.  Eyes:     General: No scleral icterus.    Conjunctiva/sclera: Conjunctivae normal.     Pupils: Pupils are equal, round, and reactive to light.  Neck:     Vascular: No carotid bruit.     Trachea: No tracheal deviation.     Comments: Trachea midline, thyroid  not grossly enlarged or tender. No neck stiffness or rigidity. Normal rom. No bruits.  Cardiovascular:     Rate and Rhythm: Normal rate and regular rhythm.     Pulses: Normal pulses.     Heart sounds: Normal heart sounds. No murmur heard.    No friction rub. No gallop.  Pulmonary:     Effort: Pulmonary effort is normal. No respiratory distress.     Breath sounds: Normal breath sounds.  Abdominal:     General: Bowel sounds are normal. There is no distension.     Palpations: Abdomen is soft.     Tenderness: There is no abdominal tenderness. There is no guarding.  Genitourinary:    Comments: No cva tenderness.  Musculoskeletal:        General: No swelling or tenderness.     Cervical back: Normal range of motion and neck supple. No rigidity or tenderness. No muscular tenderness.     Right lower leg: No edema.     Left lower leg: No edema.     Comments: CTLS spine, non tender, aligned, no step off.   Lymphadenopathy:     Cervical: No cervical adenopathy.  Skin:    General: Skin is warm and dry.     Findings: No rash.  Neurological:     Mental Status: She is alert.     Comments: Alert, speech normal. No dysarthria or aphasia noted. Motor/sens  grossly intact bil (some inconsistency w motor exam on left side, ?variable effort), no pronator drift. Sens grossly intact. Steady gait.   Psychiatric:        Mood and Affect: Mood normal.     (all labs ordered are listed, but only abnormal results are displayed) Results for orders placed or performed during the hospital encounter of 09/25/23  Protime-INR   Collection Time: 09/25/23  1:30 PM  Result Value Ref Range   Prothrombin Time 13.0 11.4 - 15.2 seconds   INR 0.9 0.8 - 1.2  APTT   Collection Time: 09/25/23  1:30 PM  Result Value Ref Range   aPTT 35 24 - 36 seconds  Comprehensive metabolic panel  Collection Time: 09/25/23  1:30 PM  Result Value Ref Range   Sodium 139 135 - 145 mmol/L   Potassium 3.9 3.5 - 5.1 mmol/L   Chloride 106 98 - 111 mmol/L   CO2 22 22 - 32 mmol/L   Glucose, Bld 148 (H) 70 - 99 mg/dL   BUN 13 6 - 20 mg/dL   Creatinine, Ser 9.14 0.44 - 1.00 mg/dL   Calcium  9.3 8.9 - 10.3 mg/dL   Total Protein 7.3 6.5 - 8.1 g/dL   Albumin 4.0 3.5 - 5.0 g/dL   AST 18 15 - 41 U/L   ALT 14 0 - 44 U/L   Alkaline Phosphatase 97 38 - 126 U/L   Total Bilirubin 0.7 0.0 - 1.2 mg/dL   GFR, Estimated >39 >39 mL/min   Anion gap 11 5 - 15  Ethanol   Collection Time: 09/25/23  1:30 PM  Result Value Ref Range   Alcohol, Ethyl (B) <15 <15 mg/dL  CBC with Differential   Collection Time: 09/25/23  1:30 PM  Result Value Ref Range   WBC 5.9 4.0 - 10.5 K/uL   RBC 4.72 3.87 - 5.11 MIL/uL   Hemoglobin 12.5 12.0 - 15.0 g/dL   HCT 58.2 63.9 - 53.9 %   MCV 88.3 80.0 - 100.0 fL   MCH 26.5 26.0 - 34.0 pg   MCHC 30.0 30.0 - 36.0 g/dL   RDW 85.7 88.4 - 84.4 %   Platelets 228 150 - 400 K/uL   nRBC 0.0 0.0 - 0.2 %   Neutrophils Relative % 56 %   Neutro Abs 3.3 1.7 - 7.7 K/uL   Lymphocytes Relative 33 %   Lymphs Abs 2.0 0.7 - 4.0 K/uL   Monocytes Relative 7 %   Monocytes Absolute 0.4 0.1 - 1.0 K/uL   Eosinophils Relative 3 %   Eosinophils Absolute 0.2 0.0 - 0.5 K/uL   Basophils  Relative 1 %   Basophils Absolute 0.0 0.0 - 0.1 K/uL   Immature Granulocytes 0 %   Abs Immature Granulocytes 0.01 0.00 - 0.07 K/uL  Pro Brain natriuretic peptide   Collection Time: 09/25/23  1:30 PM  Result Value Ref Range   Pro Brain Natriuretic Peptide 1,166.0 (H) <300.0 pg/mL  I-stat chem 8, ED   Collection Time: 09/25/23  1:37 PM  Result Value Ref Range   Sodium 140 135 - 145 mmol/L   Potassium 3.8 3.5 - 5.1 mmol/L   Chloride 106 98 - 111 mmol/L   BUN 15 6 - 20 mg/dL   Creatinine, Ser 8.99 0.44 - 1.00 mg/dL   Glucose, Bld 849 (H) 70 - 99 mg/dL   Calcium , Ion 1.18 1.15 - 1.40 mmol/L   TCO2 22 22 - 32 mmol/L   Hemoglobin 13.9 12.0 - 15.0 g/dL   HCT 58.9 63.9 - 53.9 %      EKG: EKG Interpretation Date/Time:  Saturday September 25 2023 13:12:45 EDT Ventricular Rate:  75 PR Interval:  135 QRS Duration:  87 QT Interval:  405 QTC Calculation: 453 R Axis:   61  Text Interpretation: Sinus rhythm Left ventricular hypertrophy with repolarization abnormality ( Sokolow-Lyon ) Nonspecific ST and T wave abnormality similar on comparison to prior Sep 2024, Dec 2023 Confirmed by Rogelia Satterfield ([id number]) on 09/25/2023 1:20:11 PM  Radiology: CT HEAD WO CONTRAST Result Date: 09/25/2023 CLINICAL DATA:  Neuro deficit, acute, stroke suspected EXAM: CT HEAD WITHOUT CONTRAST TECHNIQUE: Contiguous axial images were obtained from the base of the skull through  the vertex without intravenous contrast. RADIATION DOSE REDUCTION: This exam was performed according to the departmental dose-optimization program which includes automated exposure control, adjustment of the mA and/or kV according to patient size and/or use of iterative reconstruction technique. COMPARISON:  CT head October 26, 2021. FINDINGS: Brain: No evidence of acute infarction, hemorrhage, hydrocephalus, extra-axial collection or mass lesion/mass effect. Vascular: No hyperdense vessel. Skull: No acute fracture. Sinuses/Orbits: Clear sinuses.  No  acute orbital findings. Other: No mastoid effusions. IMPRESSION: No evidence of acute intracranial abnormality. Electronically Signed   By: Gilmore GORMAN Molt M.D.   On: 09/25/2023 14:25     Procedures   Medications Ordered in the ED  sodium chloride  flush (NS) 0.9 % injection 3 mL (3 mLs Intravenous Given 09/25/23 1410)                                    Medical Decision Making Problems Addressed: Elevated blood pressure reading: acute illness or injury Essential hypertension: chronic illness or injury with exacerbation, progression, or side effects of treatment that poses a threat to life or bodily functions Hyperglycemia: acute illness or injury Left-sided weakness: acute illness or injury with systemic symptoms that poses a threat to life or bodily functions Non compliance w medication regimen: chronic illness or injury  Amount and/or Complexity of Data Reviewed External Data Reviewed: notes. Labs: ordered. Decision-making details documented in ED Course. Radiology: ordered and independent interpretation performed. Decision-making details documented in ED Course. ECG/medicine tests: ordered and independent interpretation performed. Decision-making details documented in ED Course.  Risk Prescription drug management. Decision regarding hospitalization.   Iv ns. Continuous pulse ox and cardiac monitoring. Labs ordered/sent. Imaging ordered.   Differential diagnosis includes hyperglycemia, wheezing, cva, etc. Dispo decision including potential need for admission considered - will get labs and imaging and reassess.   Reviewed nursing notes and prior charts for additional history. External reports reviewed.  Cardiac monitor: sinus rhythm, rate 70.  Labs reviewed/interpreted by me - wbc and hgb normal. Chem w glucose mildly elevated, 148, AG normal.   Xrays reviewed/interpreted by me - pnd.   MRI reviewed/interpreted by me - pnd.   CT reviewed/interpreted by me - no hem.    1520, labs, cxr, MRI pending - signed out to oncoming EDP DR Belfi to check pending studies, recheck pt, and dispo appropriately.       Final diagnoses:  None    ED Discharge Orders     None          Bernard Drivers, MD 09/25/23 1525

## 2023-11-16 NOTE — Progress Notes (Signed)
 Rebecca Malone                                          MRN: [id number]   11/16/2023   The VBCI Quality Team Specialist reviewed this patient medical record for the purposes of chart review for care gap closure. The following were reviewed: chart review for care gap closure-glycemic status assessment.    VBCI Quality Team

## 2023-11-16 NOTE — Progress Notes (Signed)
 Rebecca Malone                                          MRN: [id number]   11/16/2023   The VBCI Quality Team Specialist reviewed this patient medical record for the purposes of chart review for care gap closure. The following were reviewed: chart review for care gap closure-kidney health evaluation for diabetes:eGFR  and uACR.    VBCI Quality Team

## 2023-12-22 ENCOUNTER — Encounter: Payer: Self-pay | Admitting: *Deleted

## 2023-12-22 NOTE — Progress Notes (Signed)
 Rebecca Malone                                          MRN: [id number]   12/22/2023   The VBCI Quality Team Specialist reviewed this patient medical record for the purposes of chart review for care gap closure. The following were reviewed: chart review for care gap closure-breast cancer screening, diabetic eye exam, glycemic status assessment, and kidney health evaluation for diabetes:eGFR  and uACR.    VBCI Quality Team

## 2024-02-18 NOTE — Progress Notes (Signed)
 Rebecca Malone                                          MRN: [id number]   02/18/2024   The VBCI Quality Team Specialist reviewed this patient medical record for the purposes of chart review for care gap closure. The following were reviewed: chart review for care gap closure-glycemic status assessment.    VBCI Quality Team

## 2024-02-21 NOTE — Progress Notes (Signed)
 Rebecca Malone                                          MRN: [id number]   02/21/2024   The VBCI Quality Team Specialist reviewed this patient medical record for the purposes of chart review for care gap closure. The following were reviewed: chart review for care gap closure-glycemic status assessment.    VBCI Quality Team

## 2024-02-25 ENCOUNTER — Encounter: Payer: Self-pay | Admitting: *Deleted
